# Patient Record
Sex: Female | Born: 1975 | Race: Black or African American | Hispanic: No | Marital: Married | Smoking: Never smoker
Health system: Southern US, Community
[De-identification: ages and names within clinical notes are randomized; demographics above are authoritative.]

## PROBLEM LIST (undated history)

## (undated) DIAGNOSIS — I10 Essential (primary) hypertension: Secondary | ICD-10-CM

## (undated) DIAGNOSIS — E119 Type 2 diabetes mellitus without complications: Secondary | ICD-10-CM

## (undated) DIAGNOSIS — K219 Gastro-esophageal reflux disease without esophagitis: Secondary | ICD-10-CM

## (undated) HISTORY — PX: OTHER SURGICAL HISTORY: SHX169

## (undated) HISTORY — PX: TUBAL LIGATION: SHX77

---

## 2009-02-26 ENCOUNTER — Ambulatory Visit: Payer: Self-pay | Admitting: Obstetrics

## 2011-04-09 ENCOUNTER — Ambulatory Visit: Payer: Self-pay | Admitting: Obstetrics & Gynecology

## 2015-01-23 ENCOUNTER — Emergency Department (INDEPENDENT_AMBULATORY_CARE_PROVIDER_SITE_OTHER)
Admission: EM | Admit: 2015-01-23 | Discharge: 2015-01-23 | Disposition: A | Discharge status: Home / Self Care | Source: Home / Self Care | Attending: Family Medicine | Admitting: Family Medicine

## 2015-01-23 ENCOUNTER — Encounter (HOSPITAL_COMMUNITY): Payer: Self-pay | Admitting: *Deleted

## 2015-01-23 DIAGNOSIS — J069 Acute upper respiratory infection, unspecified: Secondary | ICD-10-CM | POA: Diagnosis not present

## 2015-01-23 HISTORY — DX: Type 2 diabetes mellitus without complications: E11.9

## 2015-01-23 HISTORY — DX: Essential (primary) hypertension: I10

## 2015-01-23 MED ORDER — IPRATROPIUM BROMIDE 0.06 % NA SOLN: 2.0000 | Freq: Four times a day (QID) | NASAL | Status: DC

## 2015-01-23 MED ORDER — AZITHROMYCIN 250 MG PO TABS: ORAL_TABLET | ORAL | Status: DC

## 2015-01-23 MED ORDER — GUAIFENESIN-CODEINE 100-10 MG/5ML PO SYRP: 10.0000 mL | ORAL_SOLUTION | Freq: Four times a day (QID) | ORAL | Status: DC | PRN

## 2015-01-23 NOTE — Discharge Instructions (Signed)
Drink plenty of fluids as discussed, use medicine as prescribed, and mucinex or delsym for cough. Return or see your doctor if further problems °

## 2015-01-23 NOTE — ED Notes (Signed)
Pt    Reports        Symptoms   Of          Cough    Congested   Sinus   Drainage   With  Symptoms  Onset        Yesterday       Pt  Reports  Went to  Work  Today  And  Vomited

## 2015-01-23 NOTE — ED Provider Notes (Signed)
CSN: [id number]     Arrival date & time 01/23/15  1423 History   First MD Initiated Contact with Patient 01/23/15 1502     Chief Complaint  Patient presents with  . URI   (Consider location/radiation/quality/duration/timing/severity/associated sxs/prior Treatment) Patient is a 39 y.o. female presenting with URI. The history is provided by the patient.  URI Presenting symptoms: congestion, cough, fever and rhinorrhea   Severity:  Mild Onset quality:  Gradual Duration:  2 days Chronicity:  New Relieved by:  None tried Worsened by:  Nothing tried Ineffective treatments:  OTC medications Associated symptoms: no wheezing     Past Medical History  Diagnosis Date  . Hypertension   . Diabetes mellitus without complication    Past Surgical History  Procedure Laterality Date  . Btl     History reviewed. No pertinent family history. History  Substance Use Topics  . Smoking status: Never Smoker   . Smokeless tobacco: Not on file  . Alcohol Use: No   OB History    No data available     Review of Systems  Constitutional: Positive for fever.  HENT: Positive for congestion, postnasal drip and rhinorrhea.   Respiratory: Positive for cough. Negative for shortness of breath and wheezing.   Cardiovascular: Negative.     Allergies  Review of patient's allergies indicates no known allergies.  Home Medications   Prior to Admission medications   Medication Sig Start Date End Date Taking? Authorizing Provider  AMLODIPINE BESYLATE PO Take by mouth.   Yes Historical Provider, MD  Cetirizine HCl (ZYRTEC PO) Take by mouth.   Yes Historical Provider, MD  METFORMIN HCL PO Take by mouth.   Yes Historical Provider, MD  telmisartan (MICARDIS) 20 MG tablet Take 20 mg by mouth daily.   Yes Historical Provider, MD  azithromycin (ZITHROMAX Z-PAK) 250 MG tablet Take as directed on pack 01/23/15   Billy Fischer, MD  guaiFENesin-codeine [facility]) 100-10 MG/5ML syrup Take 10 mLs by mouth 4  (four) times daily as needed for cough. 01/23/15   Billy Fischer, MD  ipratropium (ATROVENT) 0.06 % nasal spray Place 2 sprays into both nostrils 4 (four) times daily. 01/23/15   Billy Fischer, MD   BP 131/87 mmHg  Pulse 100  Temp(Src) 100.7 F (38.2 C) (Oral)  Resp 16  SpO2 96%  LMP 12/24/2014 Physical Exam  Constitutional: She is oriented to person, place, and time. She appears well-developed and well-nourished. No distress.  HENT:  Right Ear: External ear normal.  Left Ear: External ear normal.  Mouth/Throat: Oropharynx is clear and moist.  Neck: Normal range of motion. Neck supple.  Cardiovascular: Normal rate, regular rhythm, normal heart sounds and intact distal pulses.   Pulmonary/Chest: Effort normal and breath sounds normal.  Neurological: She is alert and oriented to person, place, and time.  Skin: Skin is warm and dry.  Nursing note and vitals reviewed.   ED Course  Procedures (including critical care time) Labs Review Labs Reviewed - No data to display  Imaging Review No results found.   MDM   1. URI (upper respiratory infection)       Billy Fischer, MD 01/23/15 [phone]

## 2017-09-08 ENCOUNTER — Other Ambulatory Visit: Payer: Self-pay

## 2017-09-08 ENCOUNTER — Emergency Department (HOSPITAL_COMMUNITY)

## 2017-09-08 ENCOUNTER — Encounter (HOSPITAL_COMMUNITY): Payer: Self-pay | Admitting: Emergency Medicine

## 2017-09-08 ENCOUNTER — Inpatient Hospital Stay (HOSPITAL_COMMUNITY)
Admission: EM | Admit: 2017-09-08 | Discharge: 2017-09-12 | DRG: 287 | Disposition: A | Discharge status: Home / Self Care | Attending: Family Medicine | Admitting: Family Medicine

## 2017-09-08 DIAGNOSIS — I071 Rheumatic tricuspid insufficiency: Secondary | ICD-10-CM | POA: Diagnosis present

## 2017-09-08 DIAGNOSIS — Z8249 Family history of ischemic heart disease and other diseases of the circulatory system: Secondary | ICD-10-CM

## 2017-09-08 DIAGNOSIS — E876 Hypokalemia: Secondary | ICD-10-CM | POA: Diagnosis not present

## 2017-09-08 DIAGNOSIS — Z6838 Body mass index (BMI) 38.0-38.9, adult: Secondary | ICD-10-CM

## 2017-09-08 DIAGNOSIS — Z7984 Long term (current) use of oral hypoglycemic drugs: Secondary | ICD-10-CM | POA: Diagnosis not present

## 2017-09-08 DIAGNOSIS — Z7982 Long term (current) use of aspirin: Secondary | ICD-10-CM | POA: Diagnosis not present

## 2017-09-08 DIAGNOSIS — I10 Essential (primary) hypertension: Secondary | ICD-10-CM | POA: Diagnosis present

## 2017-09-08 DIAGNOSIS — E119 Type 2 diabetes mellitus without complications: Secondary | ICD-10-CM | POA: Diagnosis not present

## 2017-09-08 DIAGNOSIS — R0789 Other chest pain: Secondary | ICD-10-CM | POA: Diagnosis not present

## 2017-09-08 DIAGNOSIS — R079 Chest pain, unspecified: Secondary | ICD-10-CM | POA: Diagnosis present

## 2017-09-08 DIAGNOSIS — R0609 Other forms of dyspnea: Secondary | ICD-10-CM | POA: Diagnosis not present

## 2017-09-08 DIAGNOSIS — R112 Nausea with vomiting, unspecified: Secondary | ICD-10-CM | POA: Diagnosis present

## 2017-09-08 DIAGNOSIS — R202 Paresthesia of skin: Secondary | ICD-10-CM | POA: Diagnosis present

## 2017-09-08 DIAGNOSIS — E1169 Type 2 diabetes mellitus with other specified complication: Secondary | ICD-10-CM

## 2017-09-08 LAB — BASIC METABOLIC PANEL
Anion gap: 8 (ref 5–15)
BUN: 5 mg/dL — ABNORMAL LOW (ref 6–20)
CO2: 22 mmol/L (ref 22–32)
CREATININE: 0.8 mg/dL (ref 0.44–1.00)
Calcium: 8.9 mg/dL (ref 8.9–10.3)
Chloride: 105 mmol/L (ref 101–111)
GFR calc non Af Amer: >60 mL/min (ref >60)
Glucose, Bld: 226 mg/dL — ABNORMAL HIGH (ref 65–99)
Potassium: 3.2 mmol/L — ABNORMAL LOW (ref 3.5–5.1)
SODIUM: 135 mmol/L (ref 135–145)

## 2017-09-08 LAB — TROPONIN I: Troponin I: <0.03 ng/mL (ref <0.03)

## 2017-09-08 LAB — I-STAT TROPONIN, ED: TROPONIN I, POC: 0 ng/mL (ref 0.00–0.08)

## 2017-09-08 LAB — CBC
HCT: 38.5 % (ref 36.0–46.0)
Hemoglobin: 12.4 g/dL (ref 12.0–15.0)
MCH: 29.5 pg (ref 26.0–34.0)
MCHC: 32.2 g/dL (ref 30.0–36.0)
MCV: 91.7 fL (ref 78.0–100.0)
Platelets: 448 10*3/uL — ABNORMAL HIGH (ref 150–400)
RBC: 4.2 MIL/uL (ref 3.87–5.11)
RDW: 13.4 % (ref 11.5–15.5)
WBC: 7.4 10*3/uL (ref 4.0–10.5)

## 2017-09-08 LAB — BRAIN NATRIURETIC PEPTIDE: B Natriuretic Peptide: 13.4 pg/mL (ref 0.0–100.0)

## 2017-09-08 LAB — I-STAT BETA HCG BLOOD, ED (MC, WL, AP ONLY): I-stat hCG, quantitative: <5 m[IU]/mL (ref <5)

## 2017-09-08 MED ORDER — ENOXAPARIN SODIUM 40 MG/0.4ML ~~LOC~~ SOLN
40.0000 mg | Freq: Every day | SUBCUTANEOUS | Status: DC
  Administered 2017-09-10 – 2017-09-12 (×3): 40 mg via SUBCUTANEOUS
  Filled 2017-09-08 (×5): qty 0.4

## 2017-09-08 MED ORDER — ASPIRIN EC 81 MG PO TBEC
81.0000 mg | DELAYED_RELEASE_TABLET | Freq: Every day | ORAL | Status: DC
  Administered 2017-09-09 – 2017-09-12 (×4): 81 mg via ORAL
  Filled 2017-09-08 (×4): qty 1

## 2017-09-08 MED ORDER — METOPROLOL TARTRATE 25 MG PO TABS
25.0000 mg | ORAL_TABLET | Freq: Two times a day (BID) | ORAL | Status: DC
  Administered 2017-09-08 – 2017-09-12 (×7): 25 mg via ORAL
  Filled 2017-09-08 (×7): qty 1

## 2017-09-08 MED ORDER — INSULIN ASPART 100 UNIT/ML ~~LOC~~ SOLN
0.0000 [IU] | Freq: Three times a day (TID) | SUBCUTANEOUS | Status: DC
  Administered 2017-09-09: 2 [IU] via SUBCUTANEOUS
  Administered 2017-09-10 – 2017-09-12 (×4): 1 [IU] via SUBCUTANEOUS
  Filled 2017-09-08: qty 1

## 2017-09-08 MED ORDER — ONDANSETRON HCL 4 MG/2ML IJ SOLN: 4.0000 mg | Freq: Four times a day (QID) | INTRAMUSCULAR | Status: DC | PRN

## 2017-09-08 MED ORDER — ASPIRIN 81 MG PO CHEW
324.0000 mg | CHEWABLE_TABLET | Freq: Once | ORAL | Status: AC
  Administered 2017-09-08: 324 mg via ORAL
  Filled 2017-09-08: qty 4

## 2017-09-08 MED ORDER — NITROGLYCERIN 0.4 MG SL SUBL
0.4000 mg | SUBLINGUAL_TABLET | SUBLINGUAL | Status: DC | PRN
  Administered 2017-09-08: 0.4 mg via SUBLINGUAL
  Filled 2017-09-08: qty 1

## 2017-09-08 MED ORDER — ATORVASTATIN CALCIUM 40 MG PO TABS
40.0000 mg | ORAL_TABLET | Freq: Once | ORAL | Status: AC
  Administered 2017-09-08: 40 mg via ORAL
  Filled 2017-09-08: qty 1

## 2017-09-08 MED ORDER — FAMOTIDINE IN NACL 20-0.9 MG/50ML-% IV SOLN
20.0000 mg | Freq: Two times a day (BID) | INTRAVENOUS | Status: DC
  Administered 2017-09-08 – 2017-09-09 (×2): 20 mg via INTRAVENOUS
  Filled 2017-09-08 (×2): qty 50

## 2017-09-08 MED ORDER — NITROGLYCERIN 0.4 MG SL SUBL: 0.4000 mg | SUBLINGUAL_TABLET | SUBLINGUAL | Status: DC | PRN

## 2017-09-08 MED ORDER — TELMISARTAN-HCTZ 40-12.5 MG PO TABS: 1.0000 | ORAL_TABLET | Freq: Every day | ORAL | Status: DC

## 2017-09-08 MED ORDER — POTASSIUM CHLORIDE CRYS ER 20 MEQ PO TBCR
40.0000 meq | EXTENDED_RELEASE_TABLET | Freq: Once | ORAL | Status: AC
  Administered 2017-09-08: 40 meq via ORAL
  Filled 2017-09-08: qty 2

## 2017-09-08 MED ORDER — HYDROCHLOROTHIAZIDE 12.5 MG PO CAPS
12.5000 mg | ORAL_CAPSULE | Freq: Every day | ORAL | Status: DC
  Administered 2017-09-09 – 2017-09-12 (×4): 12.5 mg via ORAL
  Filled 2017-09-08 (×4): qty 1

## 2017-09-08 MED ORDER — ASPIRIN EC 81 MG PO TBEC: 81.0000 mg | DELAYED_RELEASE_TABLET | Freq: Every day | ORAL | Status: DC

## 2017-09-08 MED ORDER — NITROGLYCERIN 2 % TD OINT
1.0000 [in_us] | TOPICAL_OINTMENT | Freq: Once | TRANSDERMAL | Status: AC
  Administered 2017-09-08: 1 [in_us] via TOPICAL
  Filled 2017-09-08: qty 1

## 2017-09-08 MED ORDER — GI COCKTAIL ~~LOC~~
30.0000 mL | Freq: Three times a day (TID) | ORAL | Status: DC | PRN
  Administered 2017-09-08: 30 mL via ORAL
  Filled 2017-09-08: qty 30

## 2017-09-08 MED ORDER — IRBESARTAN 150 MG PO TABS
150.0000 mg | ORAL_TABLET | Freq: Every day | ORAL | Status: DC
  Administered 2017-09-09 – 2017-09-12 (×4): 150 mg via ORAL
  Filled 2017-09-08 (×6): qty 1

## 2017-09-08 MED ORDER — INSULIN ASPART 100 UNIT/ML ~~LOC~~ SOLN: 0.0000 [IU] | Freq: Every day | SUBCUTANEOUS | Status: DC

## 2017-09-08 MED ORDER — ACETAMINOPHEN 325 MG PO TABS
650.0000 mg | ORAL_TABLET | ORAL | Status: DC | PRN
  Administered 2017-09-09 – 2017-09-12 (×2): 650 mg via ORAL
  Filled 2017-09-08 (×2): qty 2

## 2017-09-08 NOTE — ED Provider Notes (Signed)
[facility] Provider Note   CSN: [id number] Arrival date & time: 09/08/17  1436     History   Chief Complaint Chief Complaint  Patient presents with  . Chest Pain    HPI [facility] Perkins is a 41 y.o. female.  HPI    41 year old female with history of [facility], [facility], [facility], [facility] Perkins, [facility] Perkins this morning, she had a dull, aching, substernal chest pressure.  She had associated nausea and one episode of emesis.  She thought it was due to reflux, but it feels different from her previous reflux.  Throughout the day, she has had intermittent episodes of dull chest pressure with occasional tingling in her right arm.  She has had associated shortness of breath during these episodes.  Denies any specific alleviating or aggravating factors.  No history of coronary [facility].  She is noticed that her blood pressure has been mildly elevated.  No other alleviating factors.  Past Medical History:  Diagnosis Date  . [facility] Perkins mellitus without complication ([place])   . [facility] Perkins     Patient Active Problem List   Diagnosis Date Noted  . [facility] 09/08/2017  . [facility] mellitus without complication ([place]) 89/38/1017  . Chest pain 09/08/2017  . Hypokalemia 09/08/2017    Past Surgical History:  Procedure Laterality Date  . btl      OB History    No data available       Home Medications    Prior to Admission medications   Medication Sig Start Date End Date Taking? Authorizing Provider  amLODipine (NORVASC) 10 MG tablet Take 10 mg by mouth daily.    Yes [provider]  aspirin EC 81 MG tablet Take 81 mg by mouth daily.   Yes [provider]  Cetirizine HCl (ZYRTEC PO) Take 10 mg by mouth daily.    Yes [provider]  METFORMIN HCL PO Take 2 tablets by mouth at bedtime.    Yes [provider]  metoprolol succinate  (TOPROL XL) 50 MG 24 hr tablet Take 50 mg by mouth daily.   Yes [provider]  telmisartan-hydrochlorothiazide (MICARDIS HCT) 40-12.5 MG tablet Take 1 tablet by mouth daily.   Yes [provider]    [facility] History [facility] History  Problem Relation Age of Onset  . Coronary artery [facility] Perkins Mother     Social History Social History   Tobacco Use  . Smoking status: Never Smoker  Substance Use Topics  . Alcohol use: No  . Drug use: Not on file     Allergies   Patient has no known allergies.   Review of Systems Review of Systems  Constitutional: Positive for fatigue.  Respiratory: Positive for cough, chest tightness and shortness of breath.   Cardiovascular: Positive for chest pain and leg swelling.  All other systems reviewed and are negative.    Physical Exam Updated Vital Signs BP 139/86   Pulse 65   Temp 98.8 F (37.1 C) (Oral)   Resp 17   Ht 5\' 4"  (1.626 m)   Wt 113.4 kg (250 lb)   LMP 08/29/2017   SpO2 98%   BMI 42.91 kg/m   Physical Exam  Constitutional: She is oriented to person, place, and time. She appears well-developed and well-nourished. No distress.  HENT:  Head: Normocephalic and atraumatic.  Eyes: Conjunctivae are normal.  Neck: Neck supple.  Cardiovascular: Normal rate, regular rhythm  and normal heart sounds. Exam reveals no friction rub.  No murmur heard. Pulmonary/Chest: Effort normal and breath sounds normal. No respiratory distress. She has no wheezes. She has no rales.  Abdominal: She exhibits no distension.  Musculoskeletal:       Right lower leg: She exhibits edema (1+ pitting).       Left lower leg: She exhibits edema (1+ pitting).  Neurological: She is alert and oriented to person, place, and time. She exhibits normal muscle tone.  Skin: Skin is warm. Capillary refill takes less than 2 seconds.  Psychiatric: She has a normal mood and affect.  Nursing note and vitals reviewed.    ED Treatments / Results   Labs (all labs ordered are listed, but only abnormal results are displayed) Labs Reviewed  BASIC METABOLIC PANEL - Abnormal; Notable for the following components:      Result Value   Potassium 3.2 (*)    Glucose, Bld 226 (*)    BUN 5 (*)    All other components within normal limits  CBC - Abnormal; Notable for the following components:   Platelets 448 (*)    All other components within normal limits  BRAIN NATRIURETIC PEPTIDE  TROPONIN I  TROPONIN I  TROPONIN I  HIV ANTIBODY (ROUTINE TESTING)  BASIC METABOLIC PANEL  MAGNESIUM  LIPID PANEL  I-STAT TROPONIN, ED  I-STAT BETA HCG BLOOD, ED ([facility], [facility], [facility] ONLY)    EKG  EKG Interpretation  Date/Time:  Thursday September 08 2017 14:39:45 EST Ventricular Rate:  91 PR Interval:  184 QRS Duration: 82 QT Interval:  380 QTC Calculation: 467 R Axis:   59 Text Interpretation:  Normal sinus rhythm Anteroseptal infarct , age undetermined Abnormal ECG No old tracing to compare TWI in inferolateral leads Q waves in anteroseptal leads Reconfirmed by Duffy Bruce [phone]) on 09/08/2017 9:48:41 PM       Radiology Dg Chest 2 View  Result Date: 09/08/2017 CLINICAL DATA:  Chest tightness and right-sided tingling today. History of heart murmur, [facility], pre [facility]. EXAM: CHEST  2 VIEW COMPARISON:  None in PACs FINDINGS: The lungs are adequately inflated and clear. The heart and pulmonary vascularity are normal. The mediastinum is normal in width. There is no pleural effusion. The bony thorax exhibits no acute abnormality. IMPRESSION: There is no active cardiopulmonary [facility]. Electronically Signed   By: David  Martinique M.D.   On: 09/08/2017 15:25    Procedures Procedures (including critical care time)  Medications Ordered in ED Medications  famotidine (PEPCID) IVPB 20 mg premix (not administered)  gi cocktail (Maalox,Lidocaine,Donnatal) (not administered)  metoprolol tartrate (LOPRESSOR) tablet 25 mg (not administered)   telmisartan-hydrochlorothiazide (MICARDIS HCT) 40-12.5 MG per tablet 1 tablet (not administered)  aspirin EC tablet 81 mg (not administered)  nitroGLYCERIN (NITROSTAT) SL tablet 0.4 mg (not administered)  acetaminophen (TYLENOL) tablet 650 mg (not administered)  ondansetron (ZOFRAN) injection 4 mg (not administered)  enoxaparin (LOVENOX) injection 40 mg (not administered)  atorvastatin (LIPITOR) tablet 40 mg (not administered)  aspirin chewable tablet 324 mg (324 mg Oral Given 09/08/17 1939)  nitroGLYCERIN (NITROGLYN) 2 % ointment 1 inch (1 inch Topical Given 09/08/17 2130)  potassium chloride SA (K-DUR,KLOR-CON) CR tablet 40 mEq (40 mEq Oral Given 09/08/17 2132)     Initial Impression / Assessment and Plan / ED Course  I have reviewed the triage vital signs and the nursing notes.  Pertinent labs & imaging results that were available during my care of the patient were reviewed by me and  considered in my medical decision making (see chart for details).     41 year old female with past medical history as above [facility] with fairly concerning chest pain history.  Troponin negative but pain has been coming and going throughout the day.  Heart score is 4.  EKG nonischemic.  However, she does have old anterior Q waves as well as T wave inversions.  She is chest pain-free after nitroglycerin.  Will admit for high risk chest pain evaluation.  Final Clinical Impressions(s) / ED Diagnoses   Final diagnoses:  Atypical chest pain    ED Discharge Orders    None       Duffy Bruce, MD 09/08/17 2208

## 2017-09-08 NOTE — ED Triage Notes (Signed)
Pt states she at work around 130pm when she had a sudden onset of chest pain and tightness with right sided 'tingling". Pt states she still has some tightness in the center of her chest.

## 2017-09-08 NOTE — H&P (Signed)
History and Physical    [facility] XTG:[id number] DOB: 03/27/1976 DOA: 09/08/2017  PCP: Glendale Chard, MD   Patient coming from: Home  Chief Complaint: Chest discomfort, N/V  HPI: [facility] Perkins is a 41 y.o. female with medical history significant for hypertension and type 2 diabetes mellitus, now presenting to the emergency department for evaluation of chest discomfort.  She reports that she woke this morning with a "tightness" in her central chest symptoms were mild she suspected that it was secondary to indigestion.  The discomfort eventually eased off, but recurred several times throughout the day while she was at work.  There did not seem to be any worsening with exertion and no alleviating or exacerbating factors were identified.  She was perhaps mildly short of breath during the episodes and there was nausea with episode of vomiting associated with this.  She had never experienced these symptoms previously.  Went to bed last night in her usual state and was, or chills.  She reports that her maternal grandmother coronary artery disease with a first MI in her early 73s.  ED Course: Upon arrival to the ED, patient is found to be afebrile, saturating well on room air, and.  EKG features a normal sinus rhythm with nonspecific T wave abnormality in diffuse leads.  Chest x-ray is negative for acute cardiopulmonary disease and troponin is undetectable.  Mr. panel is notable for a potassium of 3.2 and CBC features a slight thrombocytosis with platelets 148,000.  Patient was treated with 324 mg of aspirin, sublingual nitroglycerin, and 40 mEq oral potassium.  She reported resolution of her pain after the sublingual nitroglycerin, pain later recurred in the emergency department and an inch of nitroglycerin ointment was applied.  Patient remains hemodynamically stable and in no apparent respiratory distress.  She will be observed on the telemetry unit for ongoing evaluation and management of atypical  chest pain with risk factors for CAD and EKG abnormalities with no prior available for comparison.  Review of Systems:  All other systems reviewed and apart from HPI, are negative.  Past Medical History:  Diagnosis Date  . Diabetes mellitus without complication ([place])   . Hypertension     Past Surgical History:  Procedure Laterality Date  . btl       reports that  has never smoked. She does not have any smokeless tobacco history on file. She reports that she does not drink alcohol. Her drug history is not on file.  No Known Allergies  Family History  Problem Relation Age of Onset  . Coronary artery disease Mother      Prior to Admission medications   Medication Sig Start Date End Date Taking? Authorizing Provider  amLODipine (NORVASC) 10 MG tablet Take 10 mg by mouth daily.    Yes [provider]  aspirin EC 81 MG tablet Take 81 mg by mouth daily.   Yes [provider]  Cetirizine HCl (ZYRTEC PO) Take 10 mg by mouth daily.    Yes [provider]  METFORMIN HCL PO Take 2 tablets by mouth at bedtime.    Yes [provider]  metoprolol succinate (TOPROL XL) 50 MG 24 hr tablet Take 50 mg by mouth daily.   Yes [provider]  telmisartan-hydrochlorothiazide (MICARDIS HCT) 40-12.5 MG tablet Take 1 tablet by mouth daily.   Yes [provider]    Physical Exam: Vitals:   09/08/17 1832 09/08/17 2030 09/08/17 2100 09/08/17 2130  BP: (!) 146/106 128/80 120/74  139/86  Pulse: 79 70 62 65  Resp: 16 14 14 17   Temp:      TempSrc:      SpO2: 98% 100% 98% 98%  Weight:      Height:          Constitutional: NAD, calm, comfortable Eyes: PERTLA, lids and conjunctivae normal ENMT: Mucous membranes are moist. Posterior pharynx clear of any exudate or lesions.   Neck: normal, supple, no masses, no thyromegaly Respiratory: clear to auscultation bilaterally, no wheezing, no crackles. Normal respiratory effort.   Cardiovascular: S1 &  S2 heard, regular rate and rhythm. No significant JVD. Abdomen: No distension, no tenderness, no masses palpated. Bowel sounds normal.  Musculoskeletal: no clubbing / cyanosis. No joint deformity upper and lower extremities.  Skin: no significant rashes, lesions, ulcers. Warm, dry, well-perfused. Neurologic: CN 2-12 grossly intact. Sensation intact. Strength 5/5 in all 4 limbs.  Psychiatric: Alert and oriented x 3. Pleasant and cooperative.     Labs on Admission: I have personally reviewed following labs and imaging studies  CBC: Recent Labs  Lab 09/08/17 1502  WBC 7.4  HGB 12.4  HCT 38.5  MCV 91.7  PLT 161*   Basic Metabolic Panel: Recent Labs  Lab 09/08/17 1502  NA 135  K 3.2*  CL 105  CO2 22  GLUCOSE 226*  BUN 5*  CREATININE 0.80  CALCIUM 8.9   GFR: Estimated Creatinine Clearance: 115.4 mL/min (by C-G formula based on SCr of 0.8 mg/dL). Liver Function Tests: No results for input(s): AST, ALT, ALKPHOS, BILITOT, PROT, ALBUMIN in the last 168 hours. No results for input(s): LIPASE, AMYLASE in the last 168 hours. No results for input(s): AMMONIA in the last 168 hours. Coagulation Profile: No results for input(s): INR, PROTIME in the last 168 hours. Cardiac Enzymes: No results for input(s): CKTOTAL, CKMB, CKMBINDEX, TROPONINI in the last 168 hours. BNP (last 3 results) No results for input(s): PROBNP in the last 8760 hours. HbA1C: No results for input(s): HGBA1C in the last 72 hours. CBG: No results for input(s): GLUCAP in the last 168 hours. Lipid Profile: No results for input(s): CHOL, HDL, LDLCALC, TRIG, CHOLHDL, LDLDIRECT in the last 72 hours. Thyroid Function Tests: No results for input(s): TSH, T4TOTAL, FREET4, T3FREE, THYROIDAB in the last 72 hours. Anemia Panel: No results for input(s): VITAMINB12, FOLATE, FERRITIN, TIBC, IRON, RETICCTPCT in the last 72 hours. Urine analysis: No results found for: COLORURINE, APPEARANCEUR, LABSPEC, PHURINE, GLUCOSEU,  HGBUR, BILIRUBINUR, KETONESUR, PROTEINUR, UROBILINOGEN, NITRITE, LEUKOCYTESUR Sepsis Labs: @LABRCNTIP (procalcitonin:4,lacticidven:4) )No results found for this or any previous visit (from the past 240 hour(s)).   Radiological Exams on Admission: Dg Chest 2 View  Result Date: 09/08/2017 CLINICAL DATA:  Chest tightness and right-sided tingling today. History of heart murmur, hypertension, pre diabetes. EXAM: CHEST  2 VIEW COMPARISON:  None in PACs FINDINGS: The lungs are adequately inflated and clear. The heart and pulmonary vascularity are normal. The mediastinum is normal in width. There is no pleural effusion. The bony thorax exhibits no acute abnormality. IMPRESSION: There is no active cardiopulmonary disease. Electronically Signed   By: David  Martinique M.D.   On: 09/08/2017 15:25    EKG: Independently reviewed. Normal sinus rhythm, non-specific T-wave abnormality.   Assessment/Plan  1. Chest pain  - Pt presents with chest pain, atypical description, though did resolve with NTG in ED  - She has risk factors including HTN, DM, and premature CAD in relative; there are non-specific EKG changes with no prior available to compare  -  CXR unremarkable and initial troponin undetectable  - Treated with ASA 324 and NTG in ED, given a dose of Lopressor and Lipitor on admission  - Continue cardiac monitoring, obtain serial troponin measurements, repeat EKG, continue ASA and beta-blocker, check fasting lipids and consider continuing statin   2. Hypertension  - DBP elevated in ED  - Managed with Toprol, telmisartan-HCTZ at home  - Continue ARB with HCTZ; Toprol converted to Lopressor with dose now    3. Type II DM  - No A1c on file  - Managed at home with metformin only, held on admission  - Check CBG's and use low-intensity SSI as needed    4. Hypokalemia  - Serum potassium 3.2 on admission  - Treated in ED with 40 mEq oral potassium   - Repeat chemistry panel in am     DVT prophylaxis:  Lovenox Code Status: Full  Family Communication: Discussed with patient Disposition Plan: Observe on telemetry Consults called: None Admission status: Observation    Vianne Bulls, MD [facility] Pager [phone]  If 7PM-7AM, please contact night-coverage [email or web address] Password [facility]  09/08/2017, 9:56 PM

## 2017-09-09 ENCOUNTER — Other Ambulatory Visit: Payer: Self-pay

## 2017-09-09 ENCOUNTER — Observation Stay (HOSPITAL_BASED_OUTPATIENT_CLINIC_OR_DEPARTMENT_OTHER)

## 2017-09-09 DIAGNOSIS — I340 Nonrheumatic mitral (valve) insufficiency: Secondary | ICD-10-CM | POA: Diagnosis not present

## 2017-09-09 DIAGNOSIS — R079 Chest pain, unspecified: Secondary | ICD-10-CM | POA: Diagnosis not present

## 2017-09-09 LAB — HEPATIC FUNCTION PANEL
ALK PHOS: 106 U/L (ref 38–126)
ALT: 48 U/L (ref 14–54)
AST: 43 U/L — AB (ref 15–41)
Albumin: 3.2 g/dL — ABNORMAL LOW (ref 3.5–5.0)
BILIRUBIN DIRECT: 0.1 mg/dL (ref 0.1–0.5)
BILIRUBIN INDIRECT: 0.6 mg/dL (ref 0.3–0.9)
BILIRUBIN TOTAL: 0.7 mg/dL (ref 0.3–1.2)
Total Protein: 6.3 g/dL — ABNORMAL LOW (ref 6.5–8.1)

## 2017-09-09 LAB — ECHOCARDIOGRAM COMPLETE
AOASC: 28 cm
CHL CUP MV DEC (S): 169
E/e' ratio: 9.81
EWDT: 169 ms
FS: 31 % (ref 28–44)
Height: 64 in
IVS/LV PW RATIO, ED: 0.9
LA diam end sys: 42 mm
LADIAMINDEX: 1.95 cm/m2
LASIZE: 42 mm
LAVOL: 58 mL
LAVOLA4C: 53 mL
LAVOLIN: 27 mL/m2
LDCA: 3.46 cm2
LV E/e' medial: 9.81
LV E/e'average: 9.81
LV PW d: 10 mm — AB (ref 0.6–1.1)
LV TDI E'LATERAL: 10.4
LV TDI E'MEDIAL: 8.05
LVELAT: 10.4 cm/s
LVOT SV: 64 mL
LVOT VTI: 18.5 cm
LVOTD: 21 mm
LVOTPV: 89.4 cm/s
Lateral S' vel: 12.1 cm/s
MV pk E vel: 102 m/s
MVPG: 4 mmHg
MVPKAVEL: 85.4 m/s
RV TAPSE: 23 mm
Weight: 4000 oz

## 2017-09-09 LAB — CBG MONITORING, ED
GLUCOSE-CAPILLARY: 108 mg/dL — AB (ref 65–99)
Glucose-Capillary: 133 mg/dL — ABNORMAL HIGH (ref 65–99)
Glucose-Capillary: 91 mg/dL (ref 65–99)

## 2017-09-09 LAB — LIPID PANEL
CHOLESTEROL: 99 mg/dL (ref 0–200)
HDL: 31 mg/dL — AB (ref >40)
LDL Cholesterol: 47 mg/dL (ref 0–99)
TRIGLYCERIDES: 105 mg/dL (ref <150)
Total CHOL/HDL Ratio: 3.2 RATIO
VLDL: 21 mg/dL (ref 0–40)

## 2017-09-09 LAB — GLUCOSE, CAPILLARY
GLUCOSE-CAPILLARY: 168 mg/dL — AB (ref 65–99)
GLUCOSE-CAPILLARY: 136 mg/dL — AB (ref 65–99)

## 2017-09-09 LAB — BASIC METABOLIC PANEL
ANION GAP: 7 (ref 5–15)
BUN: 8 mg/dL (ref 6–20)
CALCIUM: 8.5 mg/dL — AB (ref 8.9–10.3)
CO2: 21 mmol/L — ABNORMAL LOW (ref 22–32)
Chloride: 107 mmol/L (ref 101–111)
Creatinine, Ser: 0.73 mg/dL (ref 0.44–1.00)
GLUCOSE: 212 mg/dL — AB (ref 65–99)
POTASSIUM: 3.9 mmol/L (ref 3.5–5.1)
SODIUM: 135 mmol/L (ref 135–145)

## 2017-09-09 LAB — LIPASE, BLOOD: LIPASE: 21 U/L (ref 11–51)

## 2017-09-09 LAB — TROPONIN I
Troponin I: <0.03 ng/mL (ref <0.03)
Troponin I: <0.03 ng/mL (ref <0.03)

## 2017-09-09 LAB — HIV ANTIBODY (ROUTINE TESTING W REFLEX): HIV Screen 4th Generation wRfx: NONREACTIVE

## 2017-09-09 LAB — MAGNESIUM: MAGNESIUM: 1.9 mg/dL (ref 1.7–2.4)

## 2017-09-09 MED ORDER — FAMOTIDINE 20 MG PO TABS
20.0000 mg | ORAL_TABLET | Freq: Two times a day (BID) | ORAL | Status: DC
  Administered 2017-09-09 – 2017-09-12 (×6): 20 mg via ORAL
  Filled 2017-09-09 (×6): qty 1

## 2017-09-09 NOTE — ED Notes (Signed)
Patient CBG was 91%.

## 2017-09-09 NOTE — Progress Notes (Signed)
  Echocardiogram 2D Echocardiogram has been performed.  [facility] 09/09/2017, 2:59 PM

## 2017-09-09 NOTE — ED Notes (Signed)
Breakfast tray ordered at 0612

## 2017-09-09 NOTE — ED Notes (Signed)
Family at bedside. 

## 2017-09-09 NOTE — Consult Note (Signed)
Referring Physician:  Gabrielle Perkins is an 41 y.o. female.                       Chief Complaint: Chest pain  HPI: 41 year old female has recurrent chest pain, central as well as left precordial along with exertional shortness of breath. She denies fever or cough. She has family history of MI to grand mother at age 40. She has PMH of hypertension, obesity and type II DM.  Past Medical History:  Diagnosis Date  . Diabetes mellitus without complication (HCC)   . Hypertension       Past Surgical History:  Procedure Laterality Date  . btl      Family History  Problem Relation Age of Onset  . Coronary artery disease Maternal Grandmother    Social History:  reports that  has never smoked. She does not have any smokeless tobacco history on file. She reports that she does not drink alcohol. Her drug history is not on file.  Allergies: No Known Allergies  Medications Prior to Admission  Medication Sig Dispense Refill  . amLODipine (NORVASC) 10 MG tablet Take 10 mg by mouth daily.     . aspirin EC 81 MG tablet Take 81 mg by mouth daily.    . Cetirizine HCl (ZYRTEC PO) Take 10 mg by mouth daily.     . METFORMIN HCL PO Take 2 tablets by mouth at bedtime.     . metoprolol succinate (TOPROL XL) 50 MG 24 hr tablet Take 50 mg by mouth daily.    . telmisartan-hydrochlorothiazide (MICARDIS HCT) 40-12.5 MG tablet Take 1 tablet by mouth daily.      Results for orders placed or performed during the hospital encounter of 09/08/17 (from the past 48 hour(s))  Basic metabolic panel     Status: Abnormal   Collection Time: 09/08/17  3:02 PM  Result Value Ref Range   Sodium 135 135 - 145 mmol/L   Potassium 3.2 (L) 3.5 - 5.1 mmol/L   Chloride 105 101 - 111 mmol/L   CO2 22 22 - 32 mmol/L   Glucose, Bld 226 (H) 65 - 99 mg/dL   BUN 5 (L) 6 - 20 mg/dL   Creatinine, Ser 0.80 0.44 - 1.00 mg/dL   Calcium 8.9 8.9 - 10.3 mg/dL   GFR calc non Af Amer >60 >60 mL/min   GFR calc Af Amer >60 >60 mL/min   Comment: (NOTE) The eGFR has been calculated using the CKD EPI equation. This calculation has not been validated in all clinical situations. eGFR's persistently <60 mL/min signify possible Chronic Kidney Disease.    Anion gap 8 5 - 15  CBC     Status: Abnormal   Collection Time: 09/08/17  3:02 PM  Result Value Ref Range   WBC 7.4 4.0 - 10.5 K/uL   RBC 4.20 3.87 - 5.11 MIL/uL   Hemoglobin 12.4 12.0 - 15.0 g/dL   HCT 38.5 36.0 - 46.0 %   MCV 91.7 78.0 - 100.0 fL   MCH 29.5 26.0 - 34.0 pg   MCHC 32.2 30.0 - 36.0 g/dL   RDW 13.4 11.5 - 15.5 %   Platelets 448 (H) 150 - 400 K/uL  Brain natriuretic peptide     Status: None   Collection Time: 09/08/17  3:02 PM  Result Value Ref Range   B Natriuretic Peptide 13.4 0.0 - 100.0 pg/mL  I-stat troponin, ED     Status: None     Collection Time: 09/08/17  3:06 PM  Result Value Ref Range   Troponin i, poc 0.00 0.00 - 0.08 ng/mL   Comment 3            Comment: Due to the release kinetics of cTnI, a negative result within the first hours of the onset of symptoms does not rule out myocardial infarction with certainty. If myocardial infarction is still suspected, repeat the test at appropriate intervals.   I-Stat beta hCG blood, ED     Status: None   Collection Time: 09/08/17  3:06 PM  Result Value Ref Range   I-stat hCG, quantitative <5.0 <5 mIU/mL   Comment 3            Comment:   GEST. AGE      CONC.  (mIU/mL)   <=1 WEEK        5 - 50     2 WEEKS       50 - 500     3 WEEKS       100 - 10,000     4 WEEKS     1,000 - 30,000        FEMALE AND NON-PREGNANT FEMALE:     LESS THAN 5 mIU/mL   Troponin I     Status: None   Collection Time: 09/08/17 10:37 PM  Result Value Ref Range   Troponin I <0.03 <0.03 ng/mL  CBG monitoring, ED     Status: Abnormal   Collection Time: 09/08/17 11:23 PM  Result Value Ref Range   Glucose-Capillary 133 (H) 65 - 99 mg/dL  Troponin I     Status: None   Collection Time: 09/09/17  5:39 AM  Result Value Ref  Range   Troponin I <0.03 <0.03 ng/mL  HIV antibody (Routine Testing)     Status: None   Collection Time: 09/09/17  5:39 AM  Result Value Ref Range   HIV Screen 4th Generation wRfx Non Reactive Non Reactive    Comment: (NOTE) Performed At: [facility] [address] [place], [place] [id number] Nagendra Sanjai MD Ph:[phone]   Basic metabolic panel     Status: Abnormal   Collection Time: 09/09/17  5:39 AM  Result Value Ref Range   Sodium 135 135 - 145 mmol/L   Potassium 3.9 3.5 - 5.1 mmol/L   Chloride 107 101 - 111 mmol/L   CO2 21 (L) 22 - 32 mmol/L   Glucose, Bld 212 (H) 65 - 99 mg/dL   BUN 8 6 - 20 mg/dL   Creatinine, Ser 0.73 0.44 - 1.00 mg/dL   Calcium 8.5 (L) 8.9 - 10.3 mg/dL   GFR calc non Af Amer >60 >60 mL/min   GFR calc Af Amer >60 >60 mL/min    Comment: (NOTE) The eGFR has been calculated using the CKD EPI equation. This calculation has not been validated in all clinical situations. eGFR's persistently <60 mL/min signify possible Chronic Kidney Disease.    Anion gap 7 5 - 15  Magnesium     Status: None   Collection Time: 09/09/17  5:39 AM  Result Value Ref Range   Magnesium 1.9 1.7 - 2.4 mg/dL  Lipid panel     Status: Abnormal   Collection Time: 09/09/17  5:39 AM  Result Value Ref Range   Cholesterol 99 0 - 200 mg/dL   Triglycerides 105 <150 mg/dL   HDL 31 (L) >40 mg/dL   Total CHOL/HDL Ratio 3.2 RATIO   VLDL 21 0 - 40   mg/dL   LDL Cholesterol 47 0 - 99 mg/dL    Comment:        Total Cholesterol/HDL:CHD Risk Coronary Heart Disease Risk Table                     Men   Women  1/2 Average Risk   3.4   3.3  Average Risk       5.0   4.4  2 X Average Risk   9.6   7.1  3 X Average Risk  23.4   11.0        Use the calculated Patient Ratio above and the CHD Risk Table to determine the patient's CHD Risk.        ATP III CLASSIFICATION (LDL):  <100     mg/dL   Optimal  100-129  mg/dL   Near or Above                    Optimal  130-159  mg/dL    Borderline  160-189  mg/dL   High  >190     mg/dL   Very High   CBG monitoring, ED     Status: Abnormal   Collection Time: 09/09/17  8:28 AM  Result Value Ref Range   Glucose-Capillary 108 (H) 65 - 99 mg/dL  Troponin I     Status: None   Collection Time: 09/09/17  9:46 AM  Result Value Ref Range   Troponin I <0.03 <0.03 ng/mL  Lipase, blood     Status: None   Collection Time: 09/09/17  9:46 AM  Result Value Ref Range   Lipase 21 11 - 51 U/L  Hepatic function panel     Status: Abnormal   Collection Time: 09/09/17  9:46 AM  Result Value Ref Range   Total Protein 6.3 (L) 6.5 - 8.1 g/dL   Albumin 3.2 (L) 3.5 - 5.0 g/dL   AST 43 (H) 15 - 41 U/L   ALT 48 14 - 54 U/L   Alkaline Phosphatase 106 38 - 126 U/L   Total Bilirubin 0.7 0.3 - 1.2 mg/dL   Bilirubin, Direct 0.1 0.1 - 0.5 mg/dL   Indirect Bilirubin 0.6 0.3 - 0.9 mg/dL  CBG monitoring, ED     Status: None   Collection Time: 09/09/17 12:40 PM  Result Value Ref Range   Glucose-Capillary 91 65 - 99 mg/dL  Glucose, capillary     Status: Abnormal   Collection Time: 09/09/17  4:21 PM  Result Value Ref Range   Glucose-Capillary 168 (H) 65 - 99 mg/dL  Glucose, capillary     Status: Abnormal   Collection Time: 09/09/17  9:05 PM  Result Value Ref Range   Glucose-Capillary 136 (H) 65 - 99 mg/dL   Dg Chest 2 View  Result Date: 09/08/2017 CLINICAL DATA:  Chest tightness and right-sided tingling today. History of heart murmur, hypertension, pre diabetes. EXAM: CHEST  2 VIEW COMPARISON:  None in PACs FINDINGS: The lungs are adequately inflated and clear. The heart and pulmonary vascularity are normal. The mediastinum is normal in width. There is no pleural effusion. The bony thorax exhibits no acute abnormality. IMPRESSION: There is no active cardiopulmonary disease. Electronically Signed   By: David  Jordan M.D.   On: 09/08/2017 15:25    Review Of Systems Constitutional: No fever, chills, weight loss or gain. Eyes: No vision change,  wears glasses. No discharge or pain. Ears: No hearing loss, No tinnitus. Respiratory: No asthma,   COPD, pneumonias. No shortness of breath. No hemoptysis. Cardiovascular: No chest pain, palpitation, leg edema. Gastrointestinal: No nausea, vomiting, diarrhea, constipation. No GI bleed. No hepatitis. Genitourinary: No dysuria, hematuria, kidney stone. No incontinance. Neurological: No headache, stroke, seizures.  Psychiatry: No psych facility admission for anxiety, depression, suicide. No detox. Skin: No rash. Musculoskeletal: No joint pain, fibromyalgia. No neck pain, back pain. Lymphadenopathy: No lymphadenopathy. Hematology: No anemia or easy bruising.   Blood pressure 130/68, pulse 73, temperature 98.5 F (36.9 C), temperature source Oral, resp. rate 18, height 5' 4" (1.626 m), weight 113.4 kg (250 lb), last menstrual period 08/29/2017, SpO2 98 %. Body mass index is 42.91 kg/m. General appearance: alert, cooperative, appears stated age and no distress Head: Normocephalic, atraumatic. Eyes: Brown eyes, pink conjunctiva, corneas clear. PERRL, EOM's intact. Neck: No adenopathy, no carotid bruit, no JVD, supple, symmetrical, trachea midline and thyroid not enlarged. Resp: Clear to auscultation bilaterally. Cardio: Regular rate and rhythm, S1, S2 normal, II/VI systolic murmur, no click, rub or gallop GI: Soft, non-tender; bowel sounds normal; no organomegaly. Extremities: No edema, cyanosis or clubbing. Skin: Warm and dry.  Neurologic: Alert and oriented X 3, normal strength. Normal coordination and gait.  Assessment/Plan Chest pain R/O CAD Hypertension Type II DM Morbid obesity  Nuclear stress test in AM  Gabrielle Balandran S Audrick Lamoureaux, MD  09/09/2017, 10:54 PM    

## 2017-09-09 NOTE — H&P (View-Only) (Signed)
Referring Physician:  MAILLE Gabrielle Perkins is an 41 y.o. female.                       Chief Complaint: Chest pain  HPI: 41 year old female has recurrent chest pain, central as well as left precordial along with exertional shortness of breath. She denies fever or cough. She has family history of MI to grand mother at age 24. She has PMH of hypertension, obesity and type II DM.  Past Medical History:  Diagnosis Date  . Diabetes mellitus without complication ([place])   . Hypertension       Past Surgical History:  Procedure Laterality Date  . btl      Family History  Problem Relation Age of Onset  . Coronary artery disease Maternal Grandmother    Social History:  reports that  has never smoked. She does not have any smokeless tobacco history on file. She reports that she does not drink alcohol. Her drug history is not on file.  Allergies: No Known Allergies  Medications Prior to Admission  Medication Sig Dispense Refill  . amLODipine (NORVASC) 10 MG tablet Take 10 mg by mouth daily.     Marland Kitchen aspirin EC 81 MG tablet Take 81 mg by mouth daily.    . Cetirizine HCl (ZYRTEC PO) Take 10 mg by mouth daily.     Marland Kitchen METFORMIN HCL PO Take 2 tablets by mouth at bedtime.     . metoprolol succinate (TOPROL XL) 50 MG 24 hr tablet Take 50 mg by mouth daily.    Marland Kitchen telmisartan-hydrochlorothiazide (MICARDIS HCT) 40-12.5 MG tablet Take 1 tablet by mouth daily.      Results for orders placed or performed during the hospital encounter of 09/08/17 (from the past 48 hour(s))  Basic metabolic panel     Status: Abnormal   Collection Time: 09/08/17  3:02 PM  Result Value Ref Range   Sodium 135 135 - 145 mmol/L   Potassium 3.2 (L) 3.5 - 5.1 mmol/L   Chloride 105 101 - 111 mmol/L   CO2 22 22 - 32 mmol/L   Glucose, Bld 226 (H) 65 - 99 mg/dL   BUN 5 (L) 6 - 20 mg/dL   Creatinine, Ser 0.80 0.44 - 1.00 mg/dL   Calcium 8.9 8.9 - 10.3 mg/dL   GFR calc non Af Amer >60 >60 mL/min   GFR calc Af Amer >60 >60 mL/min   Comment: (NOTE) The eGFR has been calculated using the CKD EPI equation. This calculation has not been validated in all clinical situations. eGFR's persistently <60 mL/min signify possible Chronic Kidney Disease.    Anion gap 8 5 - 15  CBC     Status: Abnormal   Collection Time: 09/08/17  3:02 PM  Result Value Ref Range   WBC 7.4 4.0 - 10.5 K/uL   RBC 4.20 3.87 - 5.11 MIL/uL   Hemoglobin 12.4 12.0 - 15.0 g/dL   HCT 38.5 36.0 - 46.0 %   MCV 91.7 78.0 - 100.0 fL   MCH 29.5 26.0 - 34.0 pg   MCHC 32.2 30.0 - 36.0 g/dL   RDW 13.4 11.5 - 15.5 %   Platelets 448 (H) 150 - 400 K/uL  Brain natriuretic peptide     Status: None   Collection Time: 09/08/17  3:02 PM  Result Value Ref Range   B Natriuretic Peptide 13.4 0.0 - 100.0 pg/mL  I-stat troponin, ED     Status: None  Collection Time: 09/08/17  3:06 PM  Result Value Ref Range   Troponin i, poc 0.00 0.00 - 0.08 ng/mL   Comment 3            Comment: Due to the release kinetics of cTnI, a negative result within the first hours of the onset of symptoms does not rule out myocardial infarction with certainty. If myocardial infarction is still suspected, repeat the test at appropriate intervals.   I-Stat beta hCG blood, ED     Status: None   Collection Time: 09/08/17  3:06 PM  Result Value Ref Range   I-stat hCG, quantitative <5.0 <5 mIU/mL   Comment 3            Comment:   GEST. AGE      CONC.  (mIU/mL)   <=1 WEEK        5 - 50     2 WEEKS       50 - 500     3 WEEKS       100 - 10,000     4 WEEKS     1,000 - 30,000        FEMALE AND NON-PREGNANT FEMALE:     LESS THAN 5 mIU/mL   Troponin I     Status: None   Collection Time: 09/08/17 10:37 PM  Result Value Ref Range   Troponin I <0.03 <0.03 ng/mL  CBG monitoring, ED     Status: Abnormal   Collection Time: 09/08/17 11:23 PM  Result Value Ref Range   Glucose-Capillary 133 (H) 65 - 99 mg/dL  Troponin I     Status: None   Collection Time: 09/09/17  5:39 AM  Result Value Ref  Range   Troponin I <0.03 <0.03 ng/mL  HIV antibody (Routine Testing)     Status: None   Collection Time: 09/09/17  5:39 AM  Result Value Ref Range   HIV Screen 4th Generation wRfx Non Reactive Non Reactive    Comment: (NOTE) Performed At: [facility] [place], [place] [id number] Rush Farmer MD BS:[id number]   Basic metabolic panel     Status: Abnormal   Collection Time: 09/09/17  5:39 AM  Result Value Ref Range   Sodium 135 135 - 145 mmol/L   Potassium 3.9 3.5 - 5.1 mmol/L   Chloride 107 101 - 111 mmol/L   CO2 21 (L) 22 - 32 mmol/L   Glucose, Bld 212 (H) 65 - 99 mg/dL   BUN 8 6 - 20 mg/dL   Creatinine, Ser 0.73 0.44 - 1.00 mg/dL   Calcium 8.5 (L) 8.9 - 10.3 mg/dL   GFR calc non Af Amer >60 >60 mL/min   GFR calc Af Amer >60 >60 mL/min    Comment: (NOTE) The eGFR has been calculated using the CKD EPI equation. This calculation has not been validated in all clinical situations. eGFR's persistently <60 mL/min signify possible Chronic Kidney Disease.    Anion gap 7 5 - 15  Magnesium     Status: None   Collection Time: 09/09/17  5:39 AM  Result Value Ref Range   Magnesium 1.9 1.7 - 2.4 mg/dL  Lipid panel     Status: Abnormal   Collection Time: 09/09/17  5:39 AM  Result Value Ref Range   Cholesterol 99 0 - 200 mg/dL   Triglycerides 105 <150 mg/dL   HDL 31 (L) >40 mg/dL   Total CHOL/HDL Ratio 3.2 RATIO   VLDL 21 0 - 40  mg/dL   LDL Cholesterol 47 0 - 99 mg/dL    Comment:        Total Cholesterol/HDL:CHD Risk Coronary Heart Disease Risk Table                     Men   Women  1/2 Average Risk   3.4   3.3  Average Risk       5.0   4.4  2 X Average Risk   9.6   7.1  3 X Average Risk  23.4   11.0        Use the calculated Patient Ratio above and the CHD Risk Table to determine the patient's CHD Risk.        ATP III CLASSIFICATION (LDL):  <100     mg/dL   Optimal  100-129  mg/dL   Near or Above                    Optimal  130-159  mg/dL    Borderline  160-189  mg/dL   High  >190     mg/dL   Very High   CBG monitoring, ED     Status: Abnormal   Collection Time: 09/09/17  8:28 AM  Result Value Ref Range   Glucose-Capillary 108 (H) 65 - 99 mg/dL  Troponin I     Status: None   Collection Time: 09/09/17  9:46 AM  Result Value Ref Range   Troponin I <0.03 <0.03 ng/mL  Lipase, blood     Status: None   Collection Time: 09/09/17  9:46 AM  Result Value Ref Range   Lipase 21 11 - 51 U/L  Hepatic function panel     Status: Abnormal   Collection Time: 09/09/17  9:46 AM  Result Value Ref Range   Total Protein 6.3 (L) 6.5 - 8.1 g/dL   Albumin 3.2 (L) 3.5 - 5.0 g/dL   AST 43 (H) 15 - 41 U/L   ALT 48 14 - 54 U/L   Alkaline Phosphatase 106 38 - 126 U/L   Total Bilirubin 0.7 0.3 - 1.2 mg/dL   Bilirubin, Direct 0.1 0.1 - 0.5 mg/dL   Indirect Bilirubin 0.6 0.3 - 0.9 mg/dL  CBG monitoring, ED     Status: None   Collection Time: 09/09/17 12:40 PM  Result Value Ref Range   Glucose-Capillary 91 65 - 99 mg/dL  Glucose, capillary     Status: Abnormal   Collection Time: 09/09/17  4:21 PM  Result Value Ref Range   Glucose-Capillary 168 (H) 65 - 99 mg/dL  Glucose, capillary     Status: Abnormal   Collection Time: 09/09/17  9:05 PM  Result Value Ref Range   Glucose-Capillary 136 (H) 65 - 99 mg/dL   Dg Chest 2 View  Result Date: 09/08/2017 CLINICAL DATA:  Chest tightness and right-sided tingling today. History of heart murmur, hypertension, pre diabetes. EXAM: CHEST  2 VIEW COMPARISON:  None in PACs FINDINGS: The lungs are adequately inflated and clear. The heart and pulmonary vascularity are normal. The mediastinum is normal in width. There is no pleural effusion. The bony thorax exhibits no acute abnormality. IMPRESSION: There is no active cardiopulmonary disease. Electronically Signed   By: David  Martinique M.D.   On: 09/08/2017 15:25    Review Of Systems Constitutional: No fever, chills, weight loss or gain. Eyes: No vision change,  wears glasses. No discharge or pain. Ears: No hearing loss, No tinnitus. Respiratory: No asthma,  COPD, pneumonias. No shortness of breath. No hemoptysis. Cardiovascular: No chest pain, palpitation, leg edema. Gastrointestinal: No nausea, vomiting, diarrhea, constipation. No GI bleed. No hepatitis. Genitourinary: No dysuria, hematuria, kidney stone. No incontinance. Neurological: No headache, stroke, seizures.  Psychiatry: No psych facility admission for anxiety, depression, suicide. No detox. Skin: No rash. Musculoskeletal: No joint pain, fibromyalgia. No neck pain, back pain. Lymphadenopathy: No lymphadenopathy. Hematology: No anemia or easy bruising.   Blood pressure 130/68, pulse 73, temperature 98.5 F (36.9 C), temperature source Oral, resp. rate 18, height _0  (1.626 m), weight 113.4 kg (250 lb), last menstrual period 08/29/2017, SpO2 98 %. Body mass index is 42.91 kg/m. General appearance: alert, cooperative, appears stated age and no distress Head: Normocephalic, atraumatic. Eyes: Brown eyes, pink conjunctiva, corneas clear. PERRL, EOM's intact. Neck: No adenopathy, no carotid bruit, no JVD, supple, symmetrical, trachea midline and thyroid not enlarged. Resp: Clear to auscultation bilaterally. Cardio: Regular rate and rhythm, S1, S2 normal, II/VI systolic murmur, no click, rub or gallop GI: Soft, non-tender; bowel sounds normal; no organomegaly. Extremities: No edema, cyanosis or clubbing. Skin: Warm and dry.  Neurologic: Alert and oriented X 3, normal strength. Normal coordination and gait.  Assessment/Plan Chest pain R/O CAD Hypertension Type II DM Morbid obesity  Nuclear stress test in AM  Birdie Riddle, MD  09/09/2017, 10:54 PM

## 2017-09-09 NOTE — ED Notes (Deleted)
Heart Healthy diet breakfast tray ordered @ 0727.  

## 2017-09-09 NOTE — ED Notes (Signed)
Heart Healthy Diet ordered for lunch.  

## 2017-09-09 NOTE — Progress Notes (Signed)
PROGRESS NOTE    Gabrielle Perkins  OFB:[id number] DOB: 05/31/1976 DOA: 09/08/2017 PCP: Glendale Chard, MD    Brief Narrative: Gabrielle Perkins is a 41 y.o. female with medical history significant for hypertension and type 2 diabetes mellitus, now presenting to the emergency department for evaluation of chest discomfort.  She reports that she woke this morning with a "tightness" in her central chest symptoms were mild she suspected that it was secondary to indigestion.  The discomfort eventually eased off, but recurred several times throughout the day while she was at work.  There did not seem to be any worsening with exertion and no alleviating or exacerbating factors were identified.  She was perhaps mildly short of breath during the episodes and there was nausea with episode of vomiting associated with this.  She had never experienced these symptoms previously.  Went to bed last night in her usual state and was, or chills.  She reports that her maternal grandmother coronary artery disease with a first MI in her early 41s.  ED Course: Upon arrival to the ED, patient is found to be afebrile, saturating well on room air, and.  EKG features a normal sinus rhythm with nonspecific T wave abnormality in diffuse leads.  Chest x-ray is negative for acute cardiopulmonary disease and troponin is undetectable.  Mr. panel is notable for a potassium of 3.2 and CBC features a slight thrombocytosis with platelets 148,000.  Patient was treated with 324 mg of aspirin, sublingual nitroglycerin, and 40 mEq oral potassium.  She reported resolution of her pain after the sublingual nitroglycerin, pain later recurred in the emergency department and an inch of nitroglycerin ointment was applied.  Patient remains hemodynamically stable and in no apparent respiratory distress.  She will be observed on the telemetry unit for ongoing evaluation and management of atypical chest pain with risk factors for CAD and EKG abnormalities  with no prior available for comparison.    Assessment & Plan:   Principal Problem:   Chest pain Active Problems:   Hypertension   Diabetes mellitus without complication (HCC)   Hypokalemia  1-Chest pain;  Troponin negative.  Check ECHO.  Multiples risk factors. Cardiology consulted.  Aspirin, statin, BB.   2-HTN;  Continue with toprol, telmisartan and HCTZ,.   3-DM type 2;  On metformin. Hold while in patient.   Hypokalemia resolved.   DVT prophylaxis: lovenox Code Status:  Full code.  Family Communication: care discussed with patient.  Disposition Plan: home at time of discharge    Consultants:   Cardiology    Procedures: echo   Subjective: Relates chest pain episode yesterday was sharp middle chest, also had SOB and right arm numbness,  She has had prior chest pain few days ago.  She related dyspnea on exertion   Objective: Vitals:   09/09/17 0920 09/09/17 1122 09/09/17 1258 09/09/17 1328  BP: 113/72  (!) 130/91 (!) 141/92  Pulse: 66 62 63 66  Resp: 14  12 14   Temp:   98.1 F (36.7 C) 98.1 F (36.7 C)  TempSrc:   Oral Oral  SpO2: 94%  98%   Weight:      Height:        Intake/Output Summary (Last 24 hours) at 09/09/2017 1639 Last data filed at 09/08/2017 2357 Gross per 24 hour  Intake 50 ml  Output -  Net 50 ml   Filed Weights   09/08/17 1444  Weight: 113.4 kg (250 lb)    Examination:  General exam:  Appears calm and comfortable  Respiratory system: Clear to auscultation. Respiratory effort normal. Cardiovascular system: S1 & S2 heard, RRR. No JVD, murmurs, rubs, gallops or clicks. No pedal edema. Gastrointestinal system: Abdomen is nondistended, soft and nontender. No organomegaly or masses felt. Normal bowel sounds heard. Central nervous system: Alert and oriented. No focal neurological deficits. Extremities: Symmetric 5 x 5 power. Skin: No rashes, lesions or ulcers Psychiatry: Judgement and insight appear normal. Mood & affect  appropriate.     Data Reviewed: I have personally reviewed following labs and imaging studies  CBC: Recent Labs  Lab 09/08/17 1502  WBC 7.4  HGB 12.4  HCT 38.5  MCV 91.7  PLT 967*   Basic Metabolic Panel: Recent Labs  Lab 09/08/17 1502 09/09/17 0539  NA 135 135  K 3.2* 3.9  CL 105 107  CO2 22 21*  GLUCOSE 226* 212*  BUN 5* 8  CREATININE 0.80 0.73  CALCIUM 8.9 8.5*  MG  --  1.9   GFR: Estimated Creatinine Clearance: 115.4 mL/min (by C-G formula based on SCr of 0.73 mg/dL). Liver Function Tests: Recent Labs  Lab 09/09/17 0946  AST 43*  ALT 48  ALKPHOS 106  BILITOT 0.7  PROT 6.3*  ALBUMIN 3.2*   Recent Labs  Lab 09/09/17 0946  LIPASE 21   No results for input(s): AMMONIA in the last 168 hours. Coagulation Profile: No results for input(s): INR, PROTIME in the last 168 hours. Cardiac Enzymes: Recent Labs  Lab 09/08/17 2237 09/09/17 0539 09/09/17 0946  TROPONINI <0.03 <0.03 <0.03   BNP (last 3 results) No results for input(s): PROBNP in the last 8760 hours. HbA1C: No results for input(s): HGBA1C in the last 72 hours. CBG: Recent Labs  Lab 09/08/17 2323 09/09/17 0828 09/09/17 1240 09/09/17 1621  GLUCAP 133* 108* 91 168*   Lipid Profile: Recent Labs    09/09/17 0539  CHOL 99  HDL 31*  LDLCALC 47  TRIG 105  CHOLHDL 3.2   Thyroid Function Tests: No results for input(s): TSH, T4TOTAL, FREET4, T3FREE, THYROIDAB in the last 72 hours. Anemia Panel: No results for input(s): VITAMINB12, FOLATE, FERRITIN, TIBC, IRON, RETICCTPCT in the last 72 hours. Sepsis Labs: No results for input(s): PROCALCITON, LATICACIDVEN in the last 168 hours.  No results found for this or any previous visit (from the past 240 hour(s)).       Radiology Studies: Dg Chest 2 View  Result Date: 09/08/2017 CLINICAL DATA:  Chest tightness and right-sided tingling today. History of heart murmur, hypertension, pre diabetes. EXAM: CHEST  2 VIEW COMPARISON:  None in  PACs FINDINGS: The lungs are adequately inflated and clear. The heart and pulmonary vascularity are normal. The mediastinum is normal in width. There is no pleural effusion. The bony thorax exhibits no acute abnormality. IMPRESSION: There is no active cardiopulmonary disease. Electronically Signed   By: David  Martinique M.D.   On: 09/08/2017 15:25        Scheduled Meds: . aspirin EC  81 mg Oral Daily  . enoxaparin (LOVENOX) injection  40 mg Subcutaneous Daily  . famotidine  20 mg Oral BID  . irbesartan  150 mg Oral Daily   And  . hydrochlorothiazide  12.5 mg Oral Daily  . insulin aspart  0-5 Units Subcutaneous QHS  . insulin aspart  0-9 Units Subcutaneous TID WC  . metoprolol tartrate  25 mg Oral BID   Continuous Infusions:   LOS: 0 days    Time spent: 35 minutes  Elmarie Shiley, MD [facility] Pager [phone]  If 7PM-7AM, please contact night-coverage [email or web address] Password [facility] 09/09/2017, 4:39 PM

## 2017-09-10 ENCOUNTER — Observation Stay (HOSPITAL_COMMUNITY)

## 2017-09-10 ENCOUNTER — Telehealth: Payer: Self-pay | Admitting: Physician Assistant

## 2017-09-10 DIAGNOSIS — Z7982 Long term (current) use of aspirin: Secondary | ICD-10-CM | POA: Diagnosis not present

## 2017-09-10 DIAGNOSIS — Z6838 Body mass index (BMI) 38.0-38.9, adult: Secondary | ICD-10-CM | POA: Diagnosis not present

## 2017-09-10 DIAGNOSIS — I071 Rheumatic tricuspid insufficiency: Secondary | ICD-10-CM | POA: Diagnosis not present

## 2017-09-10 DIAGNOSIS — R112 Nausea with vomiting, unspecified: Secondary | ICD-10-CM | POA: Diagnosis not present

## 2017-09-10 DIAGNOSIS — R0609 Other forms of dyspnea: Secondary | ICD-10-CM | POA: Diagnosis not present

## 2017-09-10 DIAGNOSIS — E119 Type 2 diabetes mellitus without complications: Secondary | ICD-10-CM | POA: Diagnosis not present

## 2017-09-10 DIAGNOSIS — Z7984 Long term (current) use of oral hypoglycemic drugs: Secondary | ICD-10-CM | POA: Diagnosis not present

## 2017-09-10 DIAGNOSIS — Z8249 Family history of ischemic heart disease and other diseases of the circulatory system: Secondary | ICD-10-CM | POA: Diagnosis not present

## 2017-09-10 DIAGNOSIS — R202 Paresthesia of skin: Secondary | ICD-10-CM | POA: Diagnosis not present

## 2017-09-10 DIAGNOSIS — I10 Essential (primary) hypertension: Secondary | ICD-10-CM | POA: Diagnosis not present

## 2017-09-10 DIAGNOSIS — R0789 Other chest pain: Secondary | ICD-10-CM | POA: Diagnosis not present

## 2017-09-10 DIAGNOSIS — E876 Hypokalemia: Secondary | ICD-10-CM | POA: Diagnosis not present

## 2017-09-10 DIAGNOSIS — R079 Chest pain, unspecified: Secondary | ICD-10-CM | POA: Diagnosis not present

## 2017-09-10 LAB — GLUCOSE, CAPILLARY
GLUCOSE-CAPILLARY: 127 mg/dL — AB (ref 65–99)
Glucose-Capillary: 136 mg/dL — ABNORMAL HIGH (ref 65–99)
Glucose-Capillary: 93 mg/dL (ref 65–99)
Glucose-Capillary: 196 mg/dL — ABNORMAL HIGH (ref 65–99)

## 2017-09-10 MED ORDER — REGADENOSON 0.4 MG/5ML IV SOLN
0.4000 mg | Freq: Once | INTRAVENOUS | Status: AC
  Administered 2017-09-10: 0.4 mg via INTRAVENOUS
  Filled 2017-09-10: qty 5

## 2017-09-10 MED ORDER — TECHNETIUM TC 99M TETROFOSMIN IV KIT
30.0000 | PACK | Freq: Once | INTRAVENOUS | Status: AC | PRN
  Administered 2017-09-10: 30 via INTRAVENOUS

## 2017-09-10 MED ORDER — TECHNETIUM TC 99M TETROFOSMIN IV KIT
10.0000 | PACK | Freq: Once | INTRAVENOUS | Status: AC | PRN
  Administered 2017-09-10: 10 via INTRAVENOUS

## 2017-09-10 MED ORDER — REGADENOSON 0.4 MG/5ML IV SOLN
INTRAVENOUS | Status: AC
  Filled 2017-09-10: qty 5

## 2017-09-10 NOTE — Telephone Encounter (Signed)
Patient was completely out of furosemide, needs prescription sent to both mail order and local pharmacy so she does not go without.  Prescriptions sent into both places.  No other issues or concerns.  Rosaria Ferries, PA-C 09/10/2017 8:56 AM Beeper [phone]

## 2017-09-10 NOTE — Progress Notes (Signed)
PROGRESS NOTE    Gabrielle Perkins  HAF:[id number] DOB: 1975/12/13 DOA: 09/08/2017 PCP: Glendale Chard, MD    Brief Narrative: Gabrielle Perkins is a 41 y.o. female with medical history significant for hypertension and type 2 diabetes mellitus, now presenting to the emergency department for evaluation of chest discomfort.  She reports that she woke this morning with a "tightness" in her central chest symptoms were mild she suspected that it was secondary to indigestion.  The discomfort eventually eased off, but recurred several times throughout the day while she was at work.  There did not seem to be any worsening with exertion and no alleviating or exacerbating factors were identified.  She was perhaps mildly short of breath during the episodes and there was nausea with episode of vomiting associated with this.  She had never experienced these symptoms previously.  Went to bed last night in her usual state and was, or chills.  She reports that her maternal grandmother coronary artery disease with a first MI in her early 14s.  ED Course: Upon arrival to the ED, patient is found to be afebrile, saturating well on room air, and.  EKG features a normal sinus rhythm with nonspecific T wave abnormality in diffuse leads.  Chest x-ray is negative for acute cardiopulmonary disease and troponin is undetectable. B-met s notable for a potassium of 3.2 and CBC features a slight thrombocytosis with platelets 148,000.  Patient was treated with 324 mg of aspirin, sublingual nitroglycerin, and 40 mEq oral potassium.  She reported resolution of her pain after the sublingual nitroglycerin, pain later recurred in the emergency department and an inch of nitroglycerin ointment was applied.  Patient remains hemodynamically stable and in no apparent respiratory distress.  She will be observed on the telemetry unit for ongoing evaluation and management of atypical chest pain with risk factors for CAD and EKG abnormalities with no  prior available for comparison.  Admitted with chest pain, stress test with area of focal reversible ischemia. Cardiology recommending Cath.   Assessment & Plan:   Principal Problem:   Chest pain Active Problems:   Hypertension   Diabetes mellitus without complication (HCC)   Hypokalemia  1-Chest pain;  Troponin negative.  ECHO; EF normal./  Multiples risk factors. Cardiology consulted.  Aspirin, statin, BB.  Stress test with area of reversible ischemia. Cardiology recommending Cath.   2-HTN;  Continue with toprol, telmisartan and HCTZ,.   3-DM type 2;  On metformin. Hold while in patient.   Hypokalemia resolved.   DVT prophylaxis: lovenox Code Status:  Full code.  Family Communication: care discussed with patient.  Disposition Plan: home at time of discharge    Consultants:   Cardiology    Procedures: echo   Subjective: She is feeling ok, denies chest pain   Objective: Vitals:   09/10/17 1022 09/10/17 1023 09/10/17 1025 09/10/17 1245  BP: (!) 168/84 (!) 153/76 (!) 158/78   Pulse:      Resp: '16 20 20 12  ' Temp:      TempSrc:      SpO2:      Weight:      Height:        Intake/Output Summary (Last 24 hours) at 09/10/2017 1422 Last data filed at 09/10/2017 1300 Gross per 24 hour  Intake 480 ml  Output -  Net 480 ml   Filed Weights   09/08/17 1444 09/10/17 0511  Weight: 113.4 kg (250 lb) 101.3 kg (223 lb 6.4 oz)    Examination:  General exam: NAD Respiratory system: CTA Cardiovascular system: S 1, S 2 RRR Gastrointestinal system: BS present, soft, nt Central nervous system: alert and oriented.  Extremities: Symmetric power.    Data Reviewed: I have personally reviewed following labs and imaging studies  CBC: Recent Labs  Lab 09/08/17 1502  WBC 7.4  HGB 12.4  HCT 38.5  MCV 91.7  PLT 154*   Basic Metabolic Panel: Recent Labs  Lab 09/08/17 1502 09/09/17 0539  NA 135 135  K 3.2* 3.9  CL 105 107  CO2 22 21*  GLUCOSE 226* 212*    BUN 5* 8  CREATININE 0.80 0.73  CALCIUM 8.9 8.5*  MG  --  1.9   GFR: Estimated Creatinine Clearance: 108.2 mL/min (by C-G formula based on SCr of 0.73 mg/dL). Liver Function Tests: Recent Labs  Lab 09/09/17 0946  AST 43*  ALT 48  ALKPHOS 106  BILITOT 0.7  PROT 6.3*  ALBUMIN 3.2*   Recent Labs  Lab 09/09/17 0946  LIPASE 21   No results for input(s): AMMONIA in the last 168 hours. Coagulation Profile: No results for input(s): INR, PROTIME in the last 168 hours. Cardiac Enzymes: Recent Labs  Lab 09/08/17 2237 09/09/17 0539 09/09/17 0946  TROPONINI <0.03 <0.03 <0.03   BNP (last 3 results) No results for input(s): PROBNP in the last 8760 hours. HbA1C: No results for input(s): HGBA1C in the last 72 hours. CBG: Recent Labs  Lab 09/09/17 1240 09/09/17 1621 09/09/17 2105 09/10/17 0731 09/10/17 1141  GLUCAP 91 168* 136* 127* 136*   Lipid Profile: Recent Labs    09/09/17 0539  CHOL 99  HDL 31*  LDLCALC 47  TRIG 105  CHOLHDL 3.2   Thyroid Function Tests: No results for input(s): TSH, T4TOTAL, FREET4, T3FREE, THYROIDAB in the last 72 hours. Anemia Panel: No results for input(s): VITAMINB12, FOLATE, FERRITIN, TIBC, IRON, RETICCTPCT in the last 72 hours. Sepsis Labs: No results for input(s): PROCALCITON, LATICACIDVEN in the last 168 hours.  No results found for this or any previous visit (from the past 240 hour(s)).       Radiology Studies: Dg Chest 2 View  Result Date: 09/08/2017 CLINICAL DATA:  Chest tightness and right-sided tingling today. History of heart murmur, hypertension, pre diabetes. EXAM: CHEST  2 VIEW COMPARISON:  None in PACs FINDINGS: The lungs are adequately inflated and clear. The heart and pulmonary vascularity are normal. The mediastinum is normal in width. There is no pleural effusion. The bony thorax exhibits no acute abnormality. IMPRESSION: There is no active cardiopulmonary disease. Electronically Signed   By: David  Martinique M.D.    On: 09/08/2017 15:25   Nm Myocar Multi W/spect W/wall Motion / Ef  Addendum Date: 09/10/2017   ADDENDUM REPORT: 09/10/2017 13:53 ADDENDUM: Please correct the first paragraph in the Findings section to read as follows: Perfusion: Small area of mildly decreased activity in the proximal anterior wall on the post Lexiscan images with reversibility on the resting images. No abnormal activity elsewhere. Electronically Signed   By: Evangeline Dakin M.D.   On: 09/10/2017 13:53   Result Date: 09/10/2017 CLINICAL DATA:  41 year old presenting with chest pain. Risk factors for coronary artery disease include hypertension, diabetes and obesity. EXAM: MYOCARDIAL IMAGING WITH SPECT (REST AND PHARMACOLOGIC-STRESS) GATED LEFT VENTRICULAR WALL MOTION STUDY LEFT VENTRICULAR EJECTION FRACTION TECHNIQUE: Standard myocardial SPECT imaging was performed after resting intravenous injection of 10 mCi Tc-57mtetrofosmin. Subsequently, intravenous infusion of Lexiscan was performed under the supervision of the Cardiology staff. At  peak effect of the drug, 30 mCi Tc-47mtetrofosmin was injected intravenously and standard myocardial SPECT imaging was performed. Quantitative gated imaging was also performed to evaluate left ventricular wall motion, and estimate left ventricular ejection fraction. COMPARISON:  None. FINDINGS: Perfusion: Small area of mildly decreased activity in the proximal anterior wall on the resting images with reversibility on the post Lexiscan images. No abnormal activity elsewhere. Wall Motion: Normal left ventricular wall motion. No left ventricular dilation. Left Ventricular Ejection Fraction: 541%End diastolic volume 1937ml End systolic volume 57 ml IMPRESSION: 1. Small area of reversibility in the proximal anterior wall which may indicate focal ischemia. 2. Normal left ventricular wall motion. 3. Left ventricular ejection fraction 51% 4. Non invasive risk stratification*: Low. *2012 Appropriate Use Criteria  for Coronary Revascularization Focused Update: J Am Coll Cardiol. 29024;09(7):353-299 http://content.[email or web address]?[email or web address] Electronically Signed: By: TEvangeline DakinM.D. On: 09/10/2017 13:22        Scheduled Meds: . regadenoson      . aspirin EC  81 mg Oral Daily  . enoxaparin (LOVENOX) injection  40 mg Subcutaneous Daily  . famotidine  20 mg Oral BID  . irbesartan  150 mg Oral Daily   And  . hydrochlorothiazide  12.5 mg Oral Daily  . insulin aspart  0-5 Units Subcutaneous QHS  . insulin aspart  0-9 Units Subcutaneous TID WC  . metoprolol tartrate  25 mg Oral BID   Continuous Infusions:   LOS: 0 days    Time spent: 35 minutes    BElmarie Shiley MD [facility] Pager [phone] If 7PM-7AM, please contact night-coverage [email or web address] Password [email or web address] 09/10/2017, 2:22 PM

## 2017-09-10 NOTE — Progress Notes (Signed)
Subjective:  Patient denies any chest pain or shortness of breath MI ruled out by serial enzymes and EKG.  Objective:  Vital Signs in the last 24 hours: Temp:  [97.4 F (36.3 C)-98.5 F (36.9 C)] 97.4 F (36.3 C) (12/22 0511) Pulse Rate:  [63-73] 64 (12/22 0511) Resp:  [12-20] 20 (12/22 1025) BP: (127-168)/(68-99) 158/78 (12/22 1025) SpO2:  [98 %] 98 % (12/22 0511) Weight:  [101.3 kg (223 lb 6.4 oz)] 101.3 kg (223 lb 6.4 oz) (12/22 0511)  Intake/Output from previous day: 12/21 0701 - 12/22 0700 In: 240 [P.O.:240] Out: -  Intake/Output from this shift: No intake/output data recorded.  Physical Exam: Neck: no adenopathy, no carotid bruit, no JVD and supple, symmetrical, trachea midline Lungs: clear to auscultation bilaterally Heart: regular rate and rhythm, S1, S2 normal and Soft systolic murmur noted no S3 gallop Abdomen: soft, non-tender; bowel sounds normal; no masses,  no organomegaly Extremities: extremities normal, atraumatic, no cyanosis or edema  Lab Results: Recent Labs    09/08/17 1502  WBC 7.4  HGB 12.4  PLT 448*   Recent Labs    09/08/17 1502 09/09/17 0539  NA 135 135  K 3.2* 3.9  CL 105 107  CO2 22 21*  GLUCOSE 226* 212*  BUN 5* 8  CREATININE 0.80 0.73   Recent Labs    09/09/17 0539 09/09/17 0946  TROPONINI <0.03 <0.03   Hepatic Function Panel Recent Labs    09/09/17 0946  PROT 6.3*  ALBUMIN 3.2*  AST 43*  ALT 48  ALKPHOS 106  BILITOT 0.7  BILIDIR 0.1  IBILI 0.6   Recent Labs    09/09/17 0539  CHOL 99   No results for input(s): PROTIME in the last 72 hours.  Imaging: Imaging results have been reviewed and Dg Chest 2 View  Result Date: 09/08/2017 CLINICAL DATA:  Chest tightness and right-sided tingling today. History of heart murmur, hypertension, pre diabetes. EXAM: CHEST  2 VIEW COMPARISON:  None in PACs FINDINGS: The lungs are adequately inflated and clear. The heart and pulmonary vascularity are normal. The mediastinum is  normal in width. There is no pleural effusion. The bony thorax exhibits no acute abnormality. IMPRESSION: There is no active cardiopulmonary disease. Electronically Signed   By: David  Martinique M.D.   On: 09/08/2017 15:25    Cardiac Studies:  Assessment/Plan:  Status post chest pain MI ruled out Hypertension Type 2 diabetes mellitus Morbid obesity Plan Continue present management Check Lexiscan Myoview results if no evidence of ischemia okay to discharge from cardiac point of view follow-up with  Dr. Doylene Canard as outpatient in 1-2 weeks  LOS: 0 days    Charolette Forward 09/10/2017, 11:52 AM

## 2017-09-10 NOTE — Progress Notes (Signed)
Previous note is in error, wrong pt   Rosaria Ferries, PA-C 09/10/2017 9:15 AM Beeper [phone]

## 2017-09-10 NOTE — Plan of Care (Signed)
VSS. Plan cath on Monday d/t Stress Test results today. No c/o  Chest Pain today.

## 2017-09-11 DIAGNOSIS — E876 Hypokalemia: Secondary | ICD-10-CM

## 2017-09-11 DIAGNOSIS — I10 Essential (primary) hypertension: Secondary | ICD-10-CM

## 2017-09-11 DIAGNOSIS — R079 Chest pain, unspecified: Secondary | ICD-10-CM

## 2017-09-11 DIAGNOSIS — E119 Type 2 diabetes mellitus without complications: Secondary | ICD-10-CM

## 2017-09-11 LAB — GLUCOSE, CAPILLARY
Glucose-Capillary: 125 mg/dL — ABNORMAL HIGH (ref 65–99)
Glucose-Capillary: 89 mg/dL (ref 65–99)
Glucose-Capillary: 142 mg/dL — ABNORMAL HIGH (ref 65–99)
Glucose-Capillary: 120 mg/dL — ABNORMAL HIGH (ref 65–99)

## 2017-09-11 LAB — HEMOGLOBIN A1C
Hgb A1c MFr Bld: 7.2 % — ABNORMAL HIGH (ref 4.8–5.6)
Mean Plasma Glucose: 159.94 mg/dL

## 2017-09-11 MED ORDER — SODIUM CHLORIDE 0.9 % WEIGHT BASED INFUSION
1.0000 mL/kg/h | INTRAVENOUS | Status: DC
  Administered 2017-09-11 – 2017-09-12 (×2): 1 mL/kg/h via INTRAVENOUS

## 2017-09-11 MED ORDER — SODIUM CHLORIDE 0.9 % IV SOLN: 250.0000 mL | INTRAVENOUS | Status: DC | PRN

## 2017-09-11 MED ORDER — SODIUM CHLORIDE 0.9% FLUSH
3.0000 mL | Freq: Two times a day (BID) | INTRAVENOUS | Status: DC
  Administered 2017-09-11: 3 mL via INTRAVENOUS

## 2017-09-11 MED ORDER — SODIUM CHLORIDE 0.9% FLUSH: 3.0000 mL | INTRAVENOUS | Status: DC | PRN

## 2017-09-11 MED ORDER — ASPIRIN 81 MG PO CHEW
81.0000 mg | CHEWABLE_TABLET | ORAL | Status: AC
  Administered 2017-09-12: 81 mg via ORAL
  Filled 2017-09-11: qty 1

## 2017-09-11 MED ORDER — CLOPIDOGREL BISULFATE 75 MG PO TABS
300.0000 mg | ORAL_TABLET | Freq: Once | ORAL | Status: AC
  Administered 2017-09-12: 300 mg via ORAL
  Filled 2017-09-11: qty 4

## 2017-09-11 MED ORDER — CLOPIDOGREL BISULFATE 75 MG PO TABS: 300.0000 mg | ORAL_TABLET | Freq: Once | ORAL | Status: DC

## 2017-09-11 NOTE — Progress Notes (Signed)
Subjective:  Patient denies any chest pain or shortness of breath underwent Lexiscan Myoview yesterday showed small area of proximal anterior wall ischemia with EF of 51%.  Objective:  Vital Signs in the last 24 hours: Temp:  [98.2 F (36.8 C)-98.5 F (36.9 C)] 98.5 F (36.9 C) (12/23 0620) Pulse Rate:  [64-79] 64 (12/23 0620) Resp:  [12-19] 19 (12/23 0620) BP: (124-136)/(78-91) 126/91 (12/23 0620) SpO2:  [95 %-98 %] 95 % (12/23 0620)  Intake/Output from previous day: 12/22 0701 - 12/23 0700 In: 600 [P.O.:600] Out: -  Intake/Output from this shift: No intake/output data recorded.  Physical Exam: Neck: no adenopathy, no carotid bruit, no JVD and supple, symmetrical, trachea midline Lungs: clear to auscultation bilaterally Heart: regular rate and rhythm, S1, S2 normal and Soft systolic murmur noted no S3 gallop Abdomen: soft, non-tender; bowel sounds normal; no masses,  no organomegaly Extremities: extremities normal, atraumatic, no cyanosis or edema  Lab Results: Recent Labs    09/08/17 1502  WBC 7.4  HGB 12.4  PLT 448*   Recent Labs    09/08/17 1502 09/09/17 0539  NA 135 135  K 3.2* 3.9  CL 105 107  CO2 22 21*  GLUCOSE 226* 212*  BUN 5* 8  CREATININE 0.80 0.73   Recent Labs    09/09/17 0539 09/09/17 0946  TROPONINI <0.03 <0.03   Hepatic Function Panel Recent Labs    09/09/17 0946  PROT 6.3*  ALBUMIN 3.2*  AST 43*  ALT 48  ALKPHOS 106  BILITOT 0.7  BILIDIR 0.1  IBILI 0.6   Recent Labs    09/09/17 0539  CHOL 99   No results for input(s): PROTIME in the last 72 hours.  Imaging: Imaging results have been reviewed and Nm Myocar Multi W/spect W/wall Motion / Ef  Addendum Date: 09/10/2017   ADDENDUM REPORT: 09/10/2017 13:53 ADDENDUM: Please correct the first paragraph in the Findings section to read as follows: Perfusion: Small area of mildly decreased activity in the proximal anterior wall on the post Lexiscan images with reversibility on the  resting images. No abnormal activity elsewhere. Electronically Signed   By: Evangeline Dakin M.D.   On: 09/10/2017 13:53   Result Date: 09/10/2017 CLINICAL DATA:  41 year old presenting with chest pain. Risk factors for coronary artery disease include hypertension, diabetes and obesity. EXAM: MYOCARDIAL IMAGING WITH SPECT (REST AND PHARMACOLOGIC-STRESS) GATED LEFT VENTRICULAR WALL MOTION STUDY LEFT VENTRICULAR EJECTION FRACTION TECHNIQUE: Standard myocardial SPECT imaging was performed after resting intravenous injection of 10 mCi Tc-80m tetrofosmin. Subsequently, intravenous infusion of Lexiscan was performed under the supervision of the Cardiology staff. At peak effect of the drug, 30 mCi Tc-77m tetrofosmin was injected intravenously and standard myocardial SPECT imaging was performed. Quantitative gated imaging was also performed to evaluate left ventricular wall motion, and estimate left ventricular ejection fraction. COMPARISON:  None. FINDINGS: Perfusion: Small area of mildly decreased activity in the proximal anterior wall on the resting images with reversibility on the post Lexiscan images. No abnormal activity elsewhere. Wall Motion: Normal left ventricular wall motion. No left ventricular dilation. Left Ventricular Ejection Fraction: 80% End diastolic volume 165 ml End systolic volume 57 ml IMPRESSION: 1. Small area of reversibility in the proximal anterior wall which may indicate focal ischemia. 2. Normal left ventricular wall motion. 3. Left ventricular ejection fraction 51% 4. Non invasive risk stratification*: Low. *2012 Appropriate Use Criteria for Coronary Revascularization Focused Update: J Am Coll Cardiol. 5374;82(7):078-675. http://[email or web address]?[email or web address] Electronically Signed: By: Evangeline Dakin M.D. On: 09/10/2017 13:22  Cardiac Studies:  Assessment/Plan:  New-onset angina/abnormal nuclear stress test rule out coronary insufficiency Hypertension Type  2 diabetes mellitus Morbid obesity Plan Discussed with patient and her family regarding abnormal nuclear stress test and left cardiac catheterization possible PTCA stenting its risk and benefits i.e. death MI stroke need for emergency CABG local vascular complications etc. and consents for PCI   LOS: 1 day    Gabrielle Perkins 09/11/2017, 11:13 AM

## 2017-09-11 NOTE — Plan of Care (Signed)
Pt is A&O x 4, VSS, follows commands, uses call light appropriately. Pt has family in the room with her at all times for support and assistance. Pt and family instructed to call RN/NT on [facility] phone using numbers provided on [facility] board and she verbalized understanding, will continue to monitor.

## 2017-09-11 NOTE — Progress Notes (Addendum)
PROGRESS NOTE    Gabrielle Perkins  EUM:[id number] DOB: August 12, 1976 DOA: 09/08/2017 PCP: Glendale Chard, MD   Brief Narrative: Gabrielle Perkins is a 41 y.o. femalewith medical history significant forhypertension and type 2 diabetes mellitus. She presented with chest pain. Nuclear chest pain abnormal and patient is scheduled for cath on 12/24. Chest pain resolved.   Assessment & Plan:   Principal Problem:   Chest pain Active Problems:   Hypertension   Diabetes mellitus without complication (HCC)   Hypokalemia   Chest pain Patient underwent nuclear stress test which was abnormal. No chest pain -Cardiology recommendations: Cath planned for 12/24 -NPO after midnight  Essential hypertension Continue irbesartan, hydrochlorothiazide, and metoprolol  Diabetes mellitus, type 2 Patient is metformin as an outpatient. Unknown  Hemoglobin A1C -Hemoglobin A1C -Continue SSI -hold metformin for 48 hours post-cath on discharge  Morbid obesity   DVT prophylaxis: Lovenox Code Status: Full code Family Communication: Husband at bedside Disposition Plan: Discharge pending cath results   Consultants:   Cardiology  Procedures:   Nuclear stress test  Antimicrobials:  None    Subjective: No chest pain or dyspnea.  Objective: Vitals:   09/10/17 2114 09/10/17 2156 09/11/17 0620 09/11/17 1346  BP: 136/79 124/82 (!) 126/91 135/85  Pulse: 70 72 64 66  Resp: 19  19 18   Temp: 98.2 F (36.8 C)  98.5 F (36.9 C) 98.9 F (37.2 C)  TempSrc: Oral  Oral Oral  SpO2: 97%  95% 97%  Weight:      Height:        Intake/Output Summary (Last 24 hours) at 09/11/2017 1639 Last data filed at 09/10/2017 2200 Gross per 24 hour  Intake 360 ml  Output -  Net 360 ml   Filed Weights   09/08/17 1444 09/10/17 0511  Weight: 113.4 kg (250 lb) 101.3 kg (223 lb 6.4 oz)    Examination:  General exam: Appears calm and comfortable Respiratory system: Clear to auscultation. Respiratory  effort normal. Cardiovascular system: S1 & S2 heard, RRR. No murmurs, rubs, gallops or clicks. Gastrointestinal system: Abdomen is nondistended, soft and nontender. No organomegaly or masses felt. Normal bowel sounds heard. Central nervous system: Alert and oriented. No focal neurological deficits. Extremities: No edema. No calf tenderness Skin: No cyanosis. No rashes Psychiatry: Judgement and insight appear normal. Mood & affect appropriate.     Data Reviewed: I have personally reviewed following labs and imaging studies  CBC: Recent Labs  Lab 09/08/17 1502  WBC 7.4  HGB 12.4  HCT 38.5  MCV 91.7  PLT 540*   Basic Metabolic Panel: Recent Labs  Lab 09/08/17 1502 09/09/17 0539  NA 135 135  K 3.2* 3.9  CL 105 107  CO2 22 21*  GLUCOSE 226* 212*  BUN 5* 8  CREATININE 0.80 0.73  CALCIUM 8.9 8.5*  MG  --  1.9   GFR: Estimated Creatinine Clearance: 108.2 mL/min (by C-G formula based on SCr of 0.73 mg/dL). Liver Function Tests: Recent Labs  Lab 09/09/17 0946  AST 43*  ALT 48  ALKPHOS 106  BILITOT 0.7  PROT 6.3*  ALBUMIN 3.2*   Recent Labs  Lab 09/09/17 0946  LIPASE 21   No results for input(s): AMMONIA in the last 168 hours. Coagulation Profile: No results for input(s): INR, PROTIME in the last 168 hours. Cardiac Enzymes: Recent Labs  Lab 09/08/17 2237 09/09/17 0539 09/09/17 0946  TROPONINI <0.03 <0.03 <0.03   BNP (last 3 results) No results for input(s): PROBNP in the  last 8760 hours. HbA1C: Recent Labs    09/11/17 1035  HGBA1C 7.2*   CBG: Recent Labs  Lab 09/10/17 1642 09/10/17 2113 09/11/17 0727 09/11/17 1126 09/11/17 1559  GLUCAP 93 196* 125* 89 142*   Lipid Profile: Recent Labs    09/09/17 0539  CHOL 99  HDL 31*  LDLCALC 47  TRIG 105  CHOLHDL 3.2   Thyroid Function Tests: No results for input(s): TSH, T4TOTAL, FREET4, T3FREE, THYROIDAB in the last 72 hours. Anemia Panel: No results for input(s): VITAMINB12, FOLATE, FERRITIN,  TIBC, IRON, RETICCTPCT in the last 72 hours. Sepsis Labs: No results for input(s): PROCALCITON, LATICACIDVEN in the last 168 hours.  No results found for this or any previous visit (from the past 240 hour(s)).       Radiology Studies: Nm Myocar Multi W/spect W/wall Motion / Ef  Addendum Date: 09/10/2017   ADDENDUM REPORT: 09/10/2017 13:53 ADDENDUM: Please correct the first paragraph in the Findings section to read as follows: Perfusion: Small area of mildly decreased activity in the proximal anterior wall on the post Lexiscan images with reversibility on the resting images. No abnormal activity elsewhere. Electronically Signed   By: Evangeline Dakin M.D.   On: 09/10/2017 13:53   Result Date: 09/10/2017 CLINICAL DATA:  41 year old presenting with chest pain. Risk factors for coronary artery disease include hypertension, diabetes and obesity. EXAM: MYOCARDIAL IMAGING WITH SPECT (REST AND PHARMACOLOGIC-STRESS) GATED LEFT VENTRICULAR WALL MOTION STUDY LEFT VENTRICULAR EJECTION FRACTION TECHNIQUE: Standard myocardial SPECT imaging was performed after resting intravenous injection of 10 mCi Tc-23m tetrofosmin. Subsequently, intravenous infusion of Lexiscan was performed under the supervision of the Cardiology staff. At peak effect of the drug, 30 mCi Tc-55m tetrofosmin was injected intravenously and standard myocardial SPECT imaging was performed. Quantitative gated imaging was also performed to evaluate left ventricular wall motion, and estimate left ventricular ejection fraction. COMPARISON:  None. FINDINGS: Perfusion: Small area of mildly decreased activity in the proximal anterior wall on the resting images with reversibility on the post Lexiscan images. No abnormal activity elsewhere. Wall Motion: Normal left ventricular wall motion. No left ventricular dilation. Left Ventricular Ejection Fraction: 40% End diastolic volume 981 ml End systolic volume 57 ml IMPRESSION: 1. Small area of reversibility in  the proximal anterior wall which may indicate focal ischemia. 2. Normal left ventricular wall motion. 3. Left ventricular ejection fraction 51% 4. Non invasive risk stratification*: Low. *2012 Appropriate Use Criteria for Coronary Revascularization Focused Update: J Am Coll Cardiol. 1914;78(2):956-213. http://content.[email or web address]?[email or web address] Electronically Signed: By: Evangeline Dakin M.D. On: 09/10/2017 13:22        Scheduled Meds: . [START ON 09/12/2017] aspirin  81 mg Oral Pre-Cath  . aspirin EC  81 mg Oral Daily  . [START ON 09/12/2017] clopidogrel  300 mg Oral Once  . enoxaparin (LOVENOX) injection  40 mg Subcutaneous Daily  . famotidine  20 mg Oral BID  . irbesartan  150 mg Oral Daily   And  . hydrochlorothiazide  12.5 mg Oral Daily  . insulin aspart  0-5 Units Subcutaneous QHS  . insulin aspart  0-9 Units Subcutaneous TID WC  . metoprolol tartrate  25 mg Oral BID  . sodium chloride flush  3 mL Intravenous Q12H   Continuous Infusions: . sodium chloride    . sodium chloride       LOS: 1 day     Cordelia Poche, MD [facility] 09/11/2017, 4:39 PM Pager: [phone]  If 7PM-7AM, please contact night-coverage [email or web address] Password [email or web address] 09/11/2017, 4:39 PM

## 2017-09-11 NOTE — Plan of Care (Signed)
Plan Cardiac Cath 12/24. Verbalizes understanding.

## 2017-09-12 ENCOUNTER — Other Ambulatory Visit: Payer: Self-pay

## 2017-09-12 ENCOUNTER — Encounter (HOSPITAL_COMMUNITY): Payer: Self-pay | Admitting: Cardiovascular Disease

## 2017-09-12 ENCOUNTER — Encounter (HOSPITAL_COMMUNITY)
Admission: EM | Disposition: A | Discharge status: Home / Self Care | Payer: Self-pay | Source: Home / Self Care | Attending: Internal Medicine

## 2017-09-12 DIAGNOSIS — R0789 Other chest pain: Principal | ICD-10-CM

## 2017-09-12 HISTORY — PX: LEFT HEART CATH AND CORONARY ANGIOGRAPHY: CATH118249

## 2017-09-12 LAB — BASIC METABOLIC PANEL
ANION GAP: 7 (ref 5–15)
BUN: 9 mg/dL (ref 6–20)
CALCIUM: 8.6 mg/dL — AB (ref 8.9–10.3)
CO2: 26 mmol/L (ref 22–32)
Chloride: 105 mmol/L (ref 101–111)
Creatinine, Ser: 0.68 mg/dL (ref 0.44–1.00)
Glucose, Bld: 122 mg/dL — ABNORMAL HIGH (ref 65–99)
POTASSIUM: 3.4 mmol/L — AB (ref 3.5–5.1)
Sodium: 138 mmol/L (ref 135–145)

## 2017-09-12 LAB — HCG, SERUM, QUALITATIVE: PREG SERUM: NEGATIVE

## 2017-09-12 LAB — PROTIME-INR
INR: 0.95
Prothrombin Time: 12.6 seconds (ref 11.4–15.2)

## 2017-09-12 LAB — GLUCOSE, CAPILLARY
Glucose-Capillary: 124 mg/dL — ABNORMAL HIGH (ref 65–99)
Glucose-Capillary: 134 mg/dL — ABNORMAL HIGH (ref 65–99)

## 2017-09-12 SURGERY — LEFT HEART CATH AND CORONARY ANGIOGRAPHY
Anesthesia: LOCAL

## 2017-09-12 MED ORDER — SODIUM CHLORIDE 0.9% FLUSH: 3.0000 mL | INTRAVENOUS | Status: DC | PRN

## 2017-09-12 MED ORDER — LIDOCAINE HCL (PF) 1 % IJ SOLN
INTRAMUSCULAR | Status: AC
  Filled 2017-09-12: qty 30

## 2017-09-12 MED ORDER — POTASSIUM CHLORIDE CRYS ER 20 MEQ PO TBCR: 40.0000 meq | EXTENDED_RELEASE_TABLET | Freq: Once | ORAL | Status: DC

## 2017-09-12 MED ORDER — HEPARIN (PORCINE) IN NACL 2-0.9 UNIT/ML-% IJ SOLN
INTRAMUSCULAR | Status: AC
  Filled 2017-09-12: qty 1000

## 2017-09-12 MED ORDER — SODIUM CHLORIDE 0.9 % IV SOLN: 250.0000 mL | INTRAVENOUS | Status: DC | PRN

## 2017-09-12 MED ORDER — SODIUM CHLORIDE 0.9% FLUSH: 3.0000 mL | Freq: Two times a day (BID) | INTRAVENOUS | Status: DC

## 2017-09-12 MED ORDER — FENTANYL CITRATE (PF) 100 MCG/2ML IJ SOLN
INTRAMUSCULAR | Status: AC
  Filled 2017-09-12: qty 2

## 2017-09-12 MED ORDER — LIDOCAINE HCL (PF) 1 % IJ SOLN
INTRAMUSCULAR | Status: DC | PRN
  Administered 2017-09-12: 15 mL

## 2017-09-12 MED ORDER — FENTANYL CITRATE (PF) 100 MCG/2ML IJ SOLN
INTRAMUSCULAR | Status: DC | PRN
  Administered 2017-09-12: 25 ug via INTRAVENOUS

## 2017-09-12 MED ORDER — IOPAMIDOL (ISOVUE-370) INJECTION 76%
INTRAVENOUS | Status: AC
  Filled 2017-09-12: qty 100

## 2017-09-12 MED ORDER — IOPAMIDOL (ISOVUE-370) INJECTION 76%
INTRAVENOUS | Status: DC | PRN
  Administered 2017-09-12: 60 mL

## 2017-09-12 MED ORDER — HEPARIN (PORCINE) IN NACL 2-0.9 UNIT/ML-% IJ SOLN
INTRAMUSCULAR | Status: DC | PRN
  Administered 2017-09-12: 08:00:00

## 2017-09-12 MED ORDER — SODIUM CHLORIDE 0.9 % IV SOLN: INTRAVENOUS | Status: AC

## 2017-09-12 MED ORDER — HYDRALAZINE HCL 20 MG/ML IJ SOLN
INTRAMUSCULAR | Status: DC | PRN
  Administered 2017-09-12: 10 mg via INTRAVENOUS

## 2017-09-12 MED ORDER — MIDAZOLAM HCL 2 MG/2ML IJ SOLN
INTRAMUSCULAR | Status: DC | PRN
  Administered 2017-09-12: 1 mg via INTRAVENOUS

## 2017-09-12 MED ORDER — HYDRALAZINE HCL 20 MG/ML IJ SOLN
INTRAMUSCULAR | Status: AC
  Filled 2017-09-12: qty 1

## 2017-09-12 MED ORDER — METFORMIN HCL ER 500 MG PO TB24
1000.0000 mg | ORAL_TABLET | Freq: Every day | ORAL | Status: DC
Start: 2017-09-14 — End: 2020-03-10

## 2017-09-12 MED ORDER — MIDAZOLAM HCL 2 MG/2ML IJ SOLN
INTRAMUSCULAR | Status: AC
  Filled 2017-09-12: qty 2

## 2017-09-12 SURGICAL SUPPLY — 7 items
CATH INFINITI 5FR MULTPACK ANG (CATHETERS) ×2 IMPLANT
KIT HEART LEFT (KITS) ×2 IMPLANT
PACK CARDIAC CATHETERIZATION (CUSTOM PROCEDURE TRAY) ×2 IMPLANT
SHEATH PINNACLE 5F 10CM (SHEATH) ×2 IMPLANT
SYR MEDRAD MARK V 150ML (SYRINGE) ×2 IMPLANT
TRANSDUCER W/STOPCOCK (MISCELLANEOUS) ×2 IMPLANT
WIRE EMERALD 3MM-J .035X150CM (WIRE) ×2 IMPLANT

## 2017-09-12 NOTE — Discharge Summary (Signed)
Physician Discharge Summary  [name] HBZ:[id number] DOB: 1976-04-17 DOA: 09/08/2017  PCP: Glendale Chard, MD  Admit date: 09/08/2017 Discharge date: 09/12/2017  Admitted From: Home Disposition: Home  Recommendations for Outpatient Follow-up:  1. Follow up with PCP in 1 week 2. Follow up with Dr. Doylene Canard in 1 week 3. Please obtain BMP/CBC in one week 4. Please follow up on the following pending results: None  Home Health: None Equipment/Devices: None  Discharge Condition: Stable CODE STATUS: Full code Diet recommendation: Heart healthy   Brief/Interim Summary:  Admission HPI written by Vianne Bulls, MD   Chief Complaint: Chest discomfort, N/V  HPI: Gabrielle Perkins is a 41 y.o. female with medical history significant for hypertension and type 2 diabetes mellitus, now presenting to the emergency department for evaluation of chest discomfort.  She reports that she woke this morning with a "tightness" in her central chest symptoms were mild she suspected that it was secondary to indigestion.  The discomfort eventually eased off, but recurred several times throughout the day while she was at work.  There did not seem to be any worsening with exertion and no alleviating or exacerbating factors were identified.  She was perhaps mildly short of breath during the episodes and there was nausea with episode of vomiting associated with this.  She had never experienced these symptoms previously.  Went to bed last night in her usual state and was, or chills.  She reports that her maternal grandmother coronary artery disease with a first MI in her early 43s.  ED Course: Upon arrival to the ED, patient is found to be afebrile, saturating well on room air, and.  EKG features a normal sinus rhythm with nonspecific T wave abnormality in diffuse leads.  Chest x-ray is negative for acute cardiopulmonary disease and troponin is undetectable.  Mr. panel is notable for a potassium of 3.2 and CBC  features a slight thrombocytosis with platelets 148,000.  Patient was treated with 324 mg of aspirin, sublingual nitroglycerin, and 40 mEq oral potassium.  She reported resolution of her pain after the sublingual nitroglycerin, pain later recurred in the emergency department and an inch of nitroglycerin ointment was applied.  Patient remains hemodynamically stable and in no apparent respiratory distress.  She will be observed on the telemetry unit for ongoing evaluation and management of atypical chest pain with risk factors for CAD and EKG abnormalities with no prior available for comparison.      Hospital course:  Chest pain Patient underwent nuclear stress test which was abnormal. No chest pain recurrent chest pain. Cath performed on 12/24 and was normal per discussion with Dr. Doylene Canard. He recommends outpatient follow-up with him in one week.  Essential hypertension Continue irbesartan, hydrochlorothiazide, and metoprolol  Diabetes mellitus, type 2 Patient is metformin as an outpatient. Hemoglobin A1C of 7.2. Sliding scale insulin while inpatient. Hold metformin for 48 hours post-cath on discharge.  Morbid obesity Body mass index is 38.35 kg/m.  Discharge Diagnoses:  Principal Problem:   Chest pain Active Problems:   Hypertension   Diabetes mellitus without complication ([place])   Hypokalemia    Discharge Instructions  Discharge Instructions    Diet - low sodium heart healthy   Complete by:  As directed    Increase activity slowly   Complete by:  As directed      Allergies as of 09/12/2017   No Known Allergies     Medication List    TAKE these medications   acetaminophen 325  MG tablet Commonly known as:  TYLENOL Take 325-650 mg by mouth every 6 (six) hours as needed (for headaches).   amLODipine 5 MG tablet Commonly known as:  NORVASC Take 5 mg by mouth daily.   aspirin EC 81 MG tablet Take 81 mg by mouth daily.   cetirizine 10 MG tablet Commonly known  as:  ZYRTEC Take 10 mg by mouth daily.   metFORMIN 500 MG 24 hr tablet Commonly known as:  GLUCOPHAGE-XR Take 2 tablets (1,000 mg total) by mouth daily with supper. Start taking on:  09/14/2017 What changed:  These instructions start on 09/14/2017. If you are unsure what to do until then, ask your doctor or other care provider.   MICARDIS HCT 40-12.5 MG tablet Generic drug:  telmisartan-hydrochlorothiazide Take 1 tablet by mouth daily.   ranitidine 150 MG capsule Commonly known as:  ZANTAC Take 150 mg by mouth daily.   TOPROL XL 50 MG 24 hr tablet Generic drug:  metoprolol succinate Take 50 mg by mouth daily.      Follow-up Information    Dixie Dials, MD. Schedule an appointment as soon as possible for a visit in 1 week(s).   Specialty:  Cardiology Contact information: [place] [address] [phone]        Glendale Chard, MD. Schedule an appointment as soon as possible for a visit in 1 week(s).   Specialty:  Internal Medicine Contact information: [address] [place] [address] [phone]          No Known Allergies  Consultations:  Cardiology   Procedures/Studies: Dg Chest 2 View  Result Date: 09/08/2017 CLINICAL DATA:  Chest tightness and right-sided tingling today. History of heart murmur, hypertension, pre diabetes. EXAM: CHEST  2 VIEW COMPARISON:  None in PACs FINDINGS: The lungs are adequately inflated and clear. The heart and pulmonary vascularity are normal. The mediastinum is normal in width. There is no pleural effusion. The bony thorax exhibits no acute abnormality. IMPRESSION: There is no active cardiopulmonary disease. Electronically Signed   By: David  Martinique M.D.   On: 09/08/2017 15:25   Nm Myocar Multi W/spect W/wall Motion / Ef  Addendum Date: 09/10/2017   ADDENDUM REPORT: 09/10/2017 13:53 ADDENDUM: Please correct the first paragraph in the Findings section to read as follows: Perfusion: Small  area of mildly decreased activity in the proximal anterior wall on the post Lexiscan images with reversibility on the resting images. No abnormal activity elsewhere. Electronically Signed   By: Evangeline Dakin M.D.   On: 09/10/2017 13:53   Result Date: 09/10/2017 CLINICAL DATA:  41 year old presenting with chest pain. Risk factors for coronary artery disease include hypertension, diabetes and obesity. EXAM: MYOCARDIAL IMAGING WITH SPECT (REST AND PHARMACOLOGIC-STRESS) GATED LEFT VENTRICULAR WALL MOTION STUDY LEFT VENTRICULAR EJECTION FRACTION TECHNIQUE: Standard myocardial SPECT imaging was performed after resting intravenous injection of 10 mCi Tc-24m tetrofosmin. Subsequently, intravenous infusion of Lexiscan was performed under the supervision of the Cardiology staff. At peak effect of the drug, 30 mCi Tc-20m tetrofosmin was injected intravenously and standard myocardial SPECT imaging was performed. Quantitative gated imaging was also performed to evaluate left ventricular wall motion, and estimate left ventricular ejection fraction. COMPARISON:  None. FINDINGS: Perfusion: Small area of mildly decreased activity in the proximal anterior wall on the resting images with reversibility on the post Lexiscan images. No abnormal activity elsewhere. Wall Motion: Normal left ventricular wall motion. No left ventricular dilation. Left Ventricular Ejection Fraction: 74% End diastolic volume 128  ml End systolic volume 57 ml IMPRESSION: 1. Small area of reversibility in the proximal anterior wall which may indicate focal ischemia. 2. Normal left ventricular wall motion. 3. Left ventricular ejection fraction 51% 4. Non invasive risk stratification*: Low. *2012 Appropriate Use Criteria for Coronary Revascularization Focused Update: J Am Coll Cardiol. 5284;13(2):440-102. http://[email or web address] Electronically Signed: By: Evangeline Dakin M.D. On: 09/10/2017 13:22     Echocardiogram  (09/09/2017) Study Conclusions  - Left ventricle: The cavity size was normal. Wall thickness was   normal. Systolic function was normal. The estimated ejection   fraction was in the range of 60% to 65%. Wall motion was normal;   there were no regional wall motion abnormalities. Left   ventricular diastolic function parameters were normal. - Mitral valve: There was mild regurgitation. - Left atrium: The atrium was mildly dilated. - Tricuspid valve: There was moderate regurgitation.   Subjective: No chest pain or dyspnea.  Discharge Exam: Vitals:   09/12/17 1200 09/12/17 1225  BP:  128/90  Pulse:    Resp: 16 14  Temp:    SpO2:     Vitals:   09/12/17 1100 09/12/17 1125 09/12/17 1200 09/12/17 1225  BP:  (!) 140/92  128/90  Pulse:      Resp: 16 20 16 14   Temp:      TempSrc:      SpO2:      Weight:      Height:        General: Pt is alert, awake, not in acute distress Cardiovascular: RRR, S1/S2 +, no rubs, no gallops Respiratory: CTA bilaterally, no wheezing, no rhonchi Abdominal: Soft, NT, ND, bowel sounds + Extremities: no edema, no cyanosis    The results of significant diagnostics from this hospitalization (including imaging, microbiology, ancillary and laboratory) are listed below for reference.     Microbiology: No results found for this or any previous visit (from the past 240 hour(s)).   Labs: BNP (last 3 results) Recent Labs    09/08/17 1502  BNP 72.5   Basic Metabolic Panel: Recent Labs  Lab 09/08/17 1502 09/09/17 0539 09/12/17 0511  NA 135 135 138  K 3.2* 3.9 3.4*  CL 105 107 105  CO2 22 21* 26  GLUCOSE 226* 212* 122*  BUN 5* 8 9  CREATININE 0.80 0.73 0.68  CALCIUM 8.9 8.5* 8.6*  MG  --  1.9  --    Liver Function Tests: Recent Labs  Lab 09/09/17 0946  AST 43*  ALT 48  ALKPHOS 106  BILITOT 0.7  PROT 6.3*  ALBUMIN 3.2*   Recent Labs  Lab 09/09/17 0946  LIPASE 21   No results for input(s): AMMONIA in the last 168  hours. CBC: Recent Labs  Lab 09/08/17 1502  WBC 7.4  HGB 12.4  HCT 38.5  MCV 91.7  PLT 448*   Cardiac Enzymes: Recent Labs  Lab 09/08/17 2237 09/09/17 0539 09/09/17 0946  TROPONINI <0.03 <0.03 <0.03   BNP: Invalid input(s): POCBNP CBG: Recent Labs  Lab 09/11/17 1126 09/11/17 1559 09/11/17 2128 09/12/17 0834 09/12/17 1126  GLUCAP 89 142* 120* 124* 134*   D-Dimer No results for input(s): DDIMER in the last 72 hours. Hgb A1c Recent Labs    09/11/17 1035  HGBA1C 7.2*   Lipid Profile No results for input(s): CHOL, HDL, LDLCALC, TRIG, CHOLHDL, LDLDIRECT in the last 72 hours. Thyroid function studies No results for input(s): TSH, T4TOTAL, T3FREE, THYROIDAB in the last 72 hours.  Invalid input(s): FREET3 Anemia work  up No results for input(s): VITAMINB12, FOLATE, FERRITIN, TIBC, IRON, RETICCTPCT in the last 72 hours. Urinalysis No results found for: COLORURINE, APPEARANCEUR, LABSPEC, [place], GLUCOSEU, HGBUR, BILIRUBINUR, KETONESUR, PROTEINUR, UROBILINOGEN, NITRITE, LEUKOCYTESUR Sepsis Labs Invalid input(s): PROCALCITONIN,  WBC,  LACTICIDVEN Microbiology No results found for this or any previous visit (from the past 240 hour(s)).   Time coordinating discharge: Over 30 minutes  SIGNED:   Cordelia Poche, MD [facility] 09/12/2017, 1:20 PM Pager [phone]  If 7PM-7AM, please contact night-coverage [email or web address] Password [email or web address]

## 2017-09-12 NOTE — Interval H&P Note (Signed)
History and Physical Interval Note:  09/12/2017 7:39 AM  Gabrielle Perkins  has presented today for surgery, with the diagnosis of unstable angina  The various methods of treatment have been discussed with the patient and family. After consideration of risks, benefits and other options for treatment, the patient has consented to  Procedure(s): LEFT HEART CATH AND CORONARY ANGIOGRAPHY (N/A) as a surgical intervention .  The patient's history has been reviewed, patient examined, no change in status, stable for surgery.  I have reviewed the patient's chart and labs.  Questions were answered to the patient's satisfaction.     Birdie Riddle

## 2017-09-12 NOTE — Discharge Instructions (Signed)
Nonspecific Chest Pain °Chest pain can be caused by many different conditions. There is always a chance that your pain could be related to something serious, such as a heart attack or a blood clot in your lungs. Chest pain can also be caused by conditions that are not life-threatening. If you have chest pain, it is very important to follow up with your health care provider. °What are the causes? °Causes of this condition include: °· Heartburn. °· Pneumonia or bronchitis. °· Anxiety or stress. °· Inflammation around your heart (pericarditis) or lung (pleuritis or pleurisy). °· A blood clot in your lung. °· A collapsed lung (pneumothorax). This can develop suddenly on its own (spontaneous pneumothorax) or from trauma to the chest. °· Shingles infection (varicella-zoster virus). °· Heart attack. °· Damage to the bones, muscles, and cartilage that make up your chest wall. This can include: °? Bruised bones due to injury. °? Strained muscles or cartilage due to frequent or repeated coughing or overwork. °? Fracture to one or more ribs. °? Sore cartilage due to inflammation (costochondritis). ° °What increases the risk? °Risk factors for this condition may include: °· Activities that increase your risk for trauma or injury to your chest. °· Respiratory infections or conditions that cause frequent coughing. °· Medical conditions or overeating that can cause heartburn. °· Heart disease or family history of heart disease. °· Conditions or health behaviors that increase your risk of developing a blood clot. °· Having had chicken pox (varicella zoster). ° °What are the signs or symptoms? °Chest pain can feel like: °· Burning or tingling on the surface of your chest or deep in your chest. °· Crushing, pressure, aching, or squeezing pain. °· Dull or sharp pain that is worse when you move, cough, or take a deep breath. °· Pain that is also felt in your back, neck, shoulder, or arm, or pain that spreads to any of these  areas. ° °Your chest pain may come and go, or it may stay constant. °How is this diagnosed? °Lab tests or other studies may be needed to find the cause of your pain. Your health care provider may have you take a test called an ECG (electrocardiogram). An ECG records your heartbeat patterns at the time the test is performed. You may also have other tests, such as: °· Transthoracic echocardiogram (TTE). In this test, sound waves are used to create a picture of the heart structures and to look at how blood flows through your heart. °· Transesophageal echocardiogram (TEE). This is a more advanced imaging test that takes images from inside your body. It allows your health care provider to see your heart in finer detail. °· Cardiac monitoring. This allows your health care provider to monitor your heart rate and rhythm in real time. °· Holter monitor. This is a portable device that records your heartbeat and can help to diagnose abnormal heartbeats. It allows your health care provider to track your heart activity for several days, if needed. °· Stress tests. These can be done through exercise or by taking medicine that makes your heart beat more quickly. °· Blood tests. °· Other imaging tests. ° °How is this treated? °Treatment depends on what is causing your chest pain. Treatment may include: °· Medicines. These may include: °? Acid blockers for heartburn. °? Anti-inflammatory medicine. °? Pain medicine for inflammatory conditions. °? Antibiotic medicine, if an infection is present. °? Medicines to dissolve blood clots. °? Medicines to treat coronary artery disease (CAD). °· Supportive care for conditions that   do not require medicines. This may include: °? Resting. °? Applying heat or cold packs to injured areas. °? Limiting activities until pain decreases. ° °Follow these instructions at home: °Medicines °· If you were prescribed an antibiotic, take it as told by your health care provider. Do not stop taking the  antibiotic even if you start to feel better. °· Take over-the-counter and prescription medicines only as told by your health care provider. °Lifestyle °· Do not use any products that contain nicotine or tobacco, such as cigarettes and e-cigarettes. If you need help quitting, ask your health care provider. °· Do not drink alcohol. °· Make lifestyle changes as directed by your health care provider. These may include: °? Getting regular exercise. Ask your health care provider to suggest some activities that are safe for you. °? Eating a heart-healthy diet. A registered dietitian can help you to learn healthy eating options. °? Maintaining a healthy weight. °? Managing diabetes, if necessary. °? Reducing stress, such as with yoga or relaxation techniques. °General instructions °· Avoid any activities that bring on chest pain. °· If heartburn is the cause for your chest pain, raise (elevate) the head of your bed about 6 inches (15 cm) by putting blocks under the legs. Sleeping with more pillows does not effectively relieve heartburn because it only changes the position of your head. °· Keep all follow-up visits as told by your health care provider. This is important. This includes any further testing if your chest pain does not go away. °Contact a health care provider if: °· Your chest pain does not go away. °· You have a rash with blisters on your chest. °· You have a fever. °· You have chills. °Get help right away if: °· Your chest pain is worse. °· You have a cough that gets worse, or you cough up blood. °· You have severe pain in your abdomen. °· You have severe weakness. °· You faint. °· You have sudden, unexplained chest discomfort. °· You have sudden, unexplained discomfort in your arms, back, neck, or jaw. °· You have shortness of breath at any time. °· You suddenly start to sweat, or your skin gets clammy. °· You feel nauseous or you vomit. °· You suddenly feel light-headed or dizzy. °· Your heart begins to beat  quickly, or it feels like it is skipping beats. °These symptoms may represent a serious problem that is an emergency. Do not wait to see if the symptoms will go away. Get medical help right away. Call your local emergency services (911 in the [place] Do not drive yourself to the hospital. °This information is not intended to replace advice given to you by your health care provider. Make sure you discuss any questions you have with your health care provider. °Document Released: 06/16/2005 Document Revised: 05/31/2016 Document Reviewed: 05/31/2016 °Elsevier Interactive Patient Education © 2017 [employer] ° °

## 2017-09-12 NOTE — Progress Notes (Addendum)
Site area: RFA Site Prior to Removal:  Level 0 Pressure Applied For:25 min Manual: yes   Patient Status During Pull:  stable Post Pull Site:  Level 0 Post Pull Instructions Given:  yes Post Pull Pulses Present: palpable Dressing Applied: tegaderm  Bedrest begins @ 0900 till 1300 Comments:

## 2017-09-12 NOTE — Progress Notes (Signed)
DC'd pt at this time.  Pt accompanied by husband and father.  Denies pain.  No s/s of any acute distress.

## 2017-09-12 NOTE — Plan of Care (Signed)
Reviewed instructions regarding her heart cath and answered questions, VSS and pt is visiting with family, will continue to monitor.

## 2017-09-12 NOTE — Progress Notes (Signed)
Dc instructions given to patient at this time.  Verbalized understanding of all instructions.  Pt ambulated entire unit at 1345.  Will dc at 1445 per order.

## 2018-07-10 ENCOUNTER — Encounter: Payer: Self-pay | Admitting: Internal Medicine

## 2018-07-10 DIAGNOSIS — K146 Glossodynia: Secondary | ICD-10-CM | POA: Insufficient documentation

## 2018-07-13 ENCOUNTER — Encounter: Payer: Self-pay | Admitting: Internal Medicine

## 2018-07-13 ENCOUNTER — Other Ambulatory Visit: Payer: Self-pay

## 2018-07-13 ENCOUNTER — Ambulatory Visit (INDEPENDENT_AMBULATORY_CARE_PROVIDER_SITE_OTHER): Admitting: Internal Medicine

## 2018-07-13 VITALS — BP 114/82 | HR 83 | Temp 98.7°F | Ht 64.0 in | Wt 218.4 lb

## 2018-07-13 DIAGNOSIS — E1165 Type 2 diabetes mellitus with hyperglycemia: Secondary | ICD-10-CM | POA: Diagnosis not present

## 2018-07-13 DIAGNOSIS — Z23 Encounter for immunization: Secondary | ICD-10-CM | POA: Diagnosis not present

## 2018-07-13 DIAGNOSIS — Z6837 Body mass index (BMI) 37.0-37.9, adult: Secondary | ICD-10-CM

## 2018-07-13 DIAGNOSIS — I10 Essential (primary) hypertension: Secondary | ICD-10-CM | POA: Diagnosis not present

## 2018-07-13 MED ORDER — SEMAGLUTIDE(0.25 OR 0.5MG/DOS) 2 MG/1.5ML ~~LOC~~ SOPN: 0.5000 mg | PEN_INJECTOR | SUBCUTANEOUS | 2 refills | Status: DC

## 2018-07-13 NOTE — Progress Notes (Signed)
Subjective:     Patient ID: Gabrielle Perkins , female    DOB: Feb 26, 1976 , 42 y.o.   MRN: [id number]   Chief Complaint  Patient presents with  . Diabetes  . Hypertension    HPI  Diabetes  She presents for her follow-up diabetic visit. She has type 2 diabetes mellitus. There are no hypoglycemic associated symptoms. There are no diabetic associated symptoms. There are no hypoglycemic complications. Risk factors for coronary artery disease include diabetes mellitus, hypertension, obesity, sedentary lifestyle and dyslipidemia. She is following a diabetic diet. She participates in exercise intermittently.  Hypertension  This is a chronic problem. The current episode started more than 1 year ago. The problem is controlled.   SHE REPORTS COMPLIANCE WITH MEDS.   Past Medical History:  Diagnosis Date  . Diabetes mellitus without complication ([place])   . Hypertension       Current Outpatient Medications:  .  acetaminophen (TYLENOL) 325 MG tablet, Take 325-650 mg by mouth every 6 (six) hours as needed (for headaches)., Disp: , Rfl:  .  amLODipine (NORVASC) 5 MG tablet, Take 5 mg by mouth daily., Disp: , Rfl:  .  aspirin EC 81 MG tablet, Take 81 mg by mouth daily., Disp: , Rfl:  .  cetirizine (ZYRTEC) 10 MG tablet, Take 10 mg by mouth daily., Disp: , Rfl:  .  metFORMIN (GLUCOPHAGE-XR) 500 MG 24 hr tablet, Take 2 tablets (1,000 mg total) by mouth daily with supper., Disp: , Rfl:  .  metoprolol succinate (TOPROL XL) 50 MG 24 hr tablet, Take 50 mg by mouth daily., Disp: , Rfl:  .  ranitidine (ZANTAC) 150 MG capsule, Take 150 mg by mouth daily., Disp: , Rfl:  .  Semaglutide,0.25 or 0.5MG/DOS, (OZEMPIC, 0.25 OR 0.5 MG/DOSE,) 2 MG/1.5ML SOPN, Inject 0.5 mg into the skin once a week., Disp: 12 pen, Rfl: 2 .  telmisartan-hydrochlorothiazide (MICARDIS HCT) 80-25 MG tablet, Take 1 tablet by mouth daily., Disp: , Rfl:    No Known Allergies   Review of Systems  Constitutional: Negative.   HENT:  Negative.   Eyes: Negative.   Respiratory: Negative.   Cardiovascular: Negative.   Neurological: Negative.   Psychiatric/Behavioral: Negative.      Today's Vitals   07/13/18 1616  BP: 114/82  Pulse: 83  Temp: 98.7 F (37.1 C)  TempSrc: Oral  Weight: 218 lb 6.4 oz (99.1 kg)  Height: '5\' 4"'  (1.626 m)   Body mass index is 37.49 kg/m.   Objective:  Physical Exam  Constitutional: She appears well-developed and well-nourished.  Eyes: EOM are normal.  Neck: Normal range of motion.  Cardiovascular: Normal rate, regular rhythm and normal heart sounds.  Pulmonary/Chest: Effort normal and breath sounds normal.  Skin: Skin is warm and dry.  Psychiatric: She has a normal mood and affect.  Nursing note and vitals reviewed.       Assessment And Plan:     1. Uncontrolled type 2 diabetes mellitus with hyperglycemia (HCC)  I WILL CHECK A CMET, HBA1C TODAY. SHE IS ENCOURAGED TO EXERCISE NO LESS THAN FIVE DAYS WEEKLY FOR AT LEAST 30 MINUTES. I WILL CONSIDER INCREASING HER DOSE OF OZEMPIC TO HELP HER ACHIEVE OPTIMAL GLYCEMIC CONTROL WHILE ALSO HELPING HER TO ACHIEVE A HEALTHIER BMI.   - CMP14+EGFR - Hemoglobin A1c  2. Essential hypertension, benign  WELL CONTROLLED. SHE WILL CONTINUE WITH CURRENT MEDS. SHE IS ENCOURAGED TO LIMIT HER SALT INTAKE.   3. Class 2 severe obesity due to excess calories  with serious comorbidity and body mass index (BMI) of 37.0 to 37.9 in adult (HCC)  SHE HAS BMI GREATER THAN 35 ALONG WITH TWO OR MORE COMORBIDITIES ASSOCIATED WITH OBESITY.  SHE IS ENCOURAGED TO STRIVE FOR BMI LESS THAN 32 TO DECREASE CARDIAC RISK.   4. Needs flu shot  SHE WAS GIVEN FLU VACCINE.   [name], MD

## 2018-07-13 NOTE — Patient Instructions (Signed)

## 2018-07-14 LAB — CMP14+EGFR
ALBUMIN: 4.3 g/dL (ref 3.5–5.5)
ALT: 20 IU/L (ref 0–32)
AST: 20 IU/L (ref 0–40)
Albumin/Globulin Ratio: 1.9 (ref 1.2–2.2)
Alkaline Phosphatase: 104 IU/L (ref 39–117)
BILIRUBIN TOTAL: 0.2 mg/dL (ref 0.0–1.2)
BUN / CREAT RATIO: 7 — AB (ref 9–23)
BUN: 6 mg/dL (ref 6–24)
CALCIUM: 9.5 mg/dL (ref 8.7–10.2)
CHLORIDE: 99 mmol/L (ref 96–106)
CO2: 25 mmol/L (ref 20–29)
CREATININE: 0.86 mg/dL (ref 0.57–1.00)
GFR, EST AFRICAN AMERICAN: 97 mL/min/{1.73_m2} (ref >59)
GFR, EST NON AFRICAN AMERICAN: 84 mL/min/{1.73_m2} (ref >59)
GLUCOSE: 117 mg/dL — AB (ref 65–99)
Globulin, Total: 2.3 g/dL (ref 1.5–4.5)
Potassium: 3.6 mmol/L (ref 3.5–5.2)
Sodium: 138 mmol/L (ref 134–144)
TOTAL PROTEIN: 6.6 g/dL (ref 6.0–8.5)

## 2018-07-14 LAB — HEMOGLOBIN A1C
Est. average glucose Bld gHb Est-mCnc: 134 mg/dL
Hgb A1c MFr Bld: 6.3 % — ABNORMAL HIGH (ref 4.8–5.6)

## 2018-07-16 ENCOUNTER — Encounter: Payer: Self-pay | Admitting: Internal Medicine

## 2018-07-17 DIAGNOSIS — Z23 Encounter for immunization: Secondary | ICD-10-CM | POA: Diagnosis not present

## 2018-07-17 NOTE — Progress Notes (Signed)
Your kidney and liver function are normal. Your hba1c is 6.3, this is great.

## 2018-07-21 ENCOUNTER — Telehealth: Payer: Self-pay

## 2018-07-21 NOTE — Telephone Encounter (Signed)
Left the pt a message to call back for her lab results. 

## 2018-07-21 NOTE — Telephone Encounter (Signed)
-----   Message from Glendale Chard, MD sent at 07/17/2018 12:15 AM EDT ----- Your kidney and liver function are normal. Your hba1c is 6.3, this is great.

## 2018-09-29 ENCOUNTER — Encounter (HOSPITAL_COMMUNITY): Payer: Self-pay

## 2018-09-29 ENCOUNTER — Ambulatory Visit (HOSPITAL_COMMUNITY)
Admission: EM | Admit: 2018-09-29 | Discharge: 2018-09-29 | Disposition: A | Discharge status: Home / Self Care | Attending: Family Medicine | Admitting: Family Medicine

## 2018-09-29 DIAGNOSIS — J069 Acute upper respiratory infection, unspecified: Secondary | ICD-10-CM | POA: Insufficient documentation

## 2018-09-29 DIAGNOSIS — B9789 Other viral agents as the cause of diseases classified elsewhere: Secondary | ICD-10-CM | POA: Diagnosis not present

## 2018-09-29 MED ORDER — PREDNISONE 50 MG PO TABS: 50.0000 mg | ORAL_TABLET | Freq: Every day | ORAL | 0 refills | Status: DC

## 2018-09-29 MED ORDER — FLUTICASONE PROPIONATE 50 MCG/ACT NA SUSP: 2.0000 | Freq: Every day | NASAL | 0 refills | Status: DC

## 2018-09-29 MED ORDER — IPRATROPIUM BROMIDE 0.06 % NA SOLN: 2.0000 | Freq: Four times a day (QID) | NASAL | 0 refills | Status: DC

## 2018-09-29 MED ORDER — DOXYCYCLINE HYCLATE 100 MG PO CAPS: 100.0000 mg | ORAL_CAPSULE | Freq: Two times a day (BID) | ORAL | 0 refills | Status: DC

## 2018-09-29 NOTE — Discharge Instructions (Signed)
Prednisone as directed. Start flonase, atrovent nasal spray for nasal congestion/drainage. You can use over the counter nasal saline rinse such as neti pot for nasal congestion. Keep hydrated, your urine should be clear to pale yellow in color. Tylenol/motrin for fever and pain. Monitor for any worsening of symptoms, chest pain, shortness of breath, wheezing, swelling of the throat, follow up for reevaluation.   If symptoms not improving, can fill doxycycline on 1/15 to cover for bacterial infection.  For sore throat/cough try using a honey-based tea. Use 3 teaspoons of honey with juice squeezed from half lemon. Place shaved pieces of ginger into 1/2-1 cup of water and warm over stove top. Then mix the ingredients and repeat every 4 hours as needed.

## 2018-09-29 NOTE — ED Triage Notes (Signed)
Pt presents with persistent cough X 2 weeks with yellow mucus, chills, sweats and head congestion.

## 2018-09-29 NOTE — ED Provider Notes (Signed)
[facility]    CSN: [id number] Arrival date & time: 09/29/18  5465     History   Chief Complaint Chief Complaint  Patient presents with  . URI    HPI Gabrielle Perkins is a 43 y.o. female.   43 year old female with history of DM, HTN comes in for 6 day history of URI symptoms. Has had rhinorrhea, nasal congestion, cough. States cough is mostly nonproductive, but can be productive at times, this is slowly improving. Has had chills and night sweats without known fever. Had chest soreness with cough that has resolved. Denies chest pain, shortness of breath, wheezing, hemoptysis. Never smoker. otc cold medicine without relief. Last a1c 6.3     Past Medical History:  Diagnosis Date  . Diabetes mellitus without complication ([place])   . Hypertension     Patient Active Problem List   Diagnosis Date Noted  . 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 Hypertension 09/08/2017  . Diabetes mellitus without complication ([place]) 03/54/6568  . Chest pain 09/08/2017  . Hypokalemia 09/08/2017    Past Surgical History:  Procedure Laterality Date  . btl    . LEFT HEART CATH AND CORONARY ANGIOGRAPHY N/A 09/12/2017   Procedure: LEFT HEART CATH AND CORONARY ANGIOGRAPHY;  Surgeon: Dixie Dials, MD;  Location: [facility];  Service: Cardiovascular;  Laterality: N/A;    OB History   No obstetric history on file.      Home Medications    Prior to Admission medications   Medication Sig Start Date End Date Taking? Authorizing Provider  acetaminophen (TYLENOL) 325 MG tablet Take 325-650 mg by mouth every 6 (six) hours as needed (for headaches).    [provider]  amLODipine (NORVASC) 5 MG tablet Take 5 mg by mouth daily.    [provider]  aspirin EC 81 MG tablet Take 81 mg by mouth daily.    [provider]  cetirizine (ZYRTEC) 10 MG tablet Take 10 mg by mouth daily.    [provider]  doxycycline (VIBRAMYCIN) 100 MG capsule Take 1 capsule (100 mg  total) by mouth 2 (two) times daily. 10/04/18   Tasia Catchings, Amy V, PA-C  fluticasone (FLONASE) 50 MCG/ACT nasal spray Place 2 sprays into both nostrils daily. 09/29/18   Tasia Catchings, Amy V, PA-C  ipratropium (ATROVENT) 0.06 % nasal spray Place 2 sprays into both nostrils 4 (four) times daily. 09/29/18   Tasia Catchings, Amy V, PA-C  metFORMIN (GLUCOPHAGE-XR) 500 MG 24 hr tablet Take 2 tablets (1,000 mg total) by mouth daily with supper. 09/14/17   Mariel Aloe, MD  metoprolol succinate (TOPROL XL) 50 MG 24 hr tablet Take 50 mg by mouth daily.    [provider]  predniSONE (DELTASONE) 50 MG tablet Take 1 tablet (50 mg total) by mouth daily. 09/29/18   Tasia Catchings, Amy V, PA-C  ranitidine (ZANTAC) 150 MG capsule Take 150 mg by mouth daily.    [provider]  Semaglutide,0.25 or 0.5MG /DOS, (OZEMPIC, 0.25 OR 0.5 MG/DOSE,) 2 MG/1.5ML SOPN Inject 0.5 mg into the skin once a week. 07/13/18   Glendale Chard, MD  telmisartan-hydrochlorothiazide (MICARDIS HCT) 80-25 MG tablet Take 1 tablet by mouth daily.    [provider]    Family History Family History  Problem Relation Age of Onset  . Coronary artery disease Maternal Grandmother   . Hypertension Mother   . Diabetes Mother   . Hypertension Father   . Diabetes Father   . Sarcoidosis Father  Social History Social History   Tobacco Use  . Smoking status: Never Smoker  . Smokeless tobacco: Never Used  Substance Use Topics  . Alcohol use: No  . Drug use: Never     Allergies   Patient has no known allergies.   Review of Systems Review of Systems  Reason unable to perform ROS: See HPI as above.     Physical Exam Triage Vital Signs ED Triage Vitals  Enc Vitals Group     BP 09/29/18 0842 128/83     Pulse Rate 09/29/18 0842 85     Resp 09/29/18 0842 18     Temp 09/29/18 0842 98.3 F (36.8 C)     Temp Source 09/29/18 0842 Oral     SpO2 09/29/18 0842 96 %     Weight --      Height --      Head Circumference --      Peak Flow --       Pain Score 09/29/18 0843 4     Pain Loc --      Pain Edu? --      Excl. in [place]? --    No data found.  Updated Vital Signs BP 128/83 (BP Location: Right Arm)   Pulse 85   Temp 98.3 F (36.8 C) (Oral)   Resp 18   LMP 09/11/2018   SpO2 96%   Physical Exam Constitutional:      General: She is not in acute distress.    Appearance: She is well-developed. She is not ill-appearing, toxic-appearing or diaphoretic.  HENT:     Head: Normocephalic and atraumatic.     Right Ear: Tympanic membrane, ear canal and external ear normal. Tympanic membrane is not erythematous or bulging.     Left Ear: Tympanic membrane, ear canal and external ear normal. Tympanic membrane is not erythematous or bulging.     Nose: Congestion present.     Right Sinus: Maxillary sinus tenderness present. No frontal sinus tenderness.     Left Sinus: Maxillary sinus tenderness present. No frontal sinus tenderness.     Mouth/Throat:     Pharynx: Uvula midline.  Eyes:     Conjunctiva/sclera: Conjunctivae normal.     Pupils: Pupils are equal, round, and reactive to light.  Neck:     Musculoskeletal: Normal range of motion and neck supple.  Cardiovascular:     Rate and Rhythm: Normal rate and regular rhythm.     Heart sounds: Normal heart sounds. No murmur. No friction rub. No gallop.   Pulmonary:     Effort: Pulmonary effort is normal.     Breath sounds: Normal breath sounds. No decreased breath sounds, wheezing, rhonchi or rales.  Lymphadenopathy:     Cervical: No cervical adenopathy.  Skin:    General: Skin is warm and dry.  Neurological:     Mental Status: She is alert and oriented to person, place, and time.  Psychiatric:        Behavior: Behavior normal.        Judgment: Judgment normal.      UC Treatments / Results  Labs (all labs ordered are listed, but only abnormal results are displayed) Labs Reviewed - No data to display  EKG None  Radiology No results found.  Procedures Procedures  (including critical care time)  Medications Ordered in UC Medications - No data to display  Initial Impression / Assessment and Plan / UC Course  I have reviewed the triage vital signs and the  nursing notes.  Pertinent labs & imaging results that were available during my care of the patient were reviewed by me and considered in my medical decision making (see chart for details).    Discussed with patient history and exam most consistent with viral URI. Prednisone for sinus pressure/cough. Symptomatic treatment as needed. Push fluids. Rx of doxycycline sent to pharmacy, can fill in 5 days if symptoms not improving. Return precautions given.   Final Clinical Impressions(s) / UC Diagnoses   Final diagnoses:  Viral URI with cough    ED Prescriptions    Medication Sig Dispense Auth. Provider   fluticasone (FLONASE) 50 MCG/ACT nasal spray Place 2 sprays into both nostrils daily. 1 g Yu, Amy V, PA-C   ipratropium (ATROVENT) 0.06 % nasal spray Place 2 sprays into both nostrils 4 (four) times daily. 15 mL Yu, Amy V, PA-C   predniSONE (DELTASONE) 50 MG tablet Take 1 tablet (50 mg total) by mouth daily. 5 tablet Yu, Amy V, PA-C   doxycycline (VIBRAMYCIN) 100 MG capsule Take 1 capsule (100 mg total) by mouth 2 (two) times daily. 20 capsule Tobin Chad, [place] 09/29/18 [phone]

## 2018-11-13 ENCOUNTER — Encounter: Payer: Self-pay | Admitting: Internal Medicine

## 2018-11-13 ENCOUNTER — Ambulatory Visit (INDEPENDENT_AMBULATORY_CARE_PROVIDER_SITE_OTHER): Admitting: Internal Medicine

## 2018-11-13 VITALS — BP 120/88 | HR 73 | Temp 98.5°F | Ht 64.6 in | Wt 216.4 lb

## 2018-11-13 DIAGNOSIS — E1169 Type 2 diabetes mellitus with other specified complication: Secondary | ICD-10-CM

## 2018-11-13 DIAGNOSIS — E669 Obesity, unspecified: Secondary | ICD-10-CM

## 2018-11-13 DIAGNOSIS — Z6836 Body mass index (BMI) 36.0-36.9, adult: Secondary | ICD-10-CM

## 2018-11-13 DIAGNOSIS — I152 Hypertension secondary to endocrine disorders: Secondary | ICD-10-CM

## 2018-11-13 DIAGNOSIS — E1159 Type 2 diabetes mellitus with other circulatory complications: Secondary | ICD-10-CM

## 2018-11-13 DIAGNOSIS — I10 Essential (primary) hypertension: Secondary | ICD-10-CM

## 2018-11-13 DIAGNOSIS — E66812 Obesity, class 2: Secondary | ICD-10-CM

## 2018-11-13 NOTE — Progress Notes (Signed)
Subjective:     Patient ID: Gabrielle Perkins , female    DOB: 1976-09-12 , 43 y.o.   MRN: [id number]   Chief Complaint  Patient presents with  . Diabetes  . Hypertension    HPI  Diabetes  She presents for her follow-up diabetic visit. She has type 2 diabetes mellitus. There are no hypoglycemic associated symptoms. There are no diabetic associated symptoms. There are no hypoglycemic complications. Risk factors for coronary artery disease include diabetes mellitus, dyslipidemia, hypertension, obesity and sedentary lifestyle. Eye exam is current.  Hypertension  This is a chronic problem. The current episode started more than 1 year ago. The problem has been gradually improving since onset. The problem is controlled.   She reports compliance with meds.   Past Medical History:  Diagnosis Date  . Diabetes mellitus without complication ([place])   . Hypertension      Family History  Problem Relation Age of Onset  . Coronary artery disease Maternal Grandmother   . Hypertension Mother   . Diabetes Mother   . Hypertension Father   . Diabetes Father   . Sarcoidosis Father      Current Outpatient Medications:  .  acetaminophen (TYLENOL) 500 MG tablet, Take 500 mg by mouth every 6 (six) hours as needed., Disp: , Rfl:  .  amLODipine (NORVASC) 5 MG tablet, Take 5 mg by mouth daily., Disp: , Rfl:  .  aspirin EC 81 MG tablet, Take 81 mg by mouth daily., Disp: , Rfl:  .  cetirizine (ZYRTEC) 10 MG tablet, Take 10 mg by mouth daily., Disp: , Rfl:  .  fluticasone (FLONASE) 50 MCG/ACT nasal spray, Place 2 sprays into both nostrils daily., Disp: 1 g, Rfl: 0 .  metFORMIN (GLUCOPHAGE-XR) 500 MG 24 hr tablet, Take 2 tablets (1,000 mg total) by mouth daily with supper., Disp: , Rfl:  .  metoprolol succinate (TOPROL XL) 50 MG 24 hr tablet, Take 50 mg by mouth daily., Disp: , Rfl:  .  ranitidine (ZANTAC) 150 MG capsule, Take 150 mg by mouth daily., Disp: , Rfl:  .  Semaglutide,0.25 or 0.5MG/DOS,  (OZEMPIC, 0.25 OR 0.5 MG/DOSE,) 2 MG/1.5ML SOPN, Inject 0.5 mg into the skin once a week., Disp: 12 pen, Rfl: 2 .  telmisartan-hydrochlorothiazide (MICARDIS HCT) 80-25 MG tablet, Take 1 tablet by mouth daily., Disp: , Rfl:  .  ipratropium (ATROVENT) 0.06 % nasal spray, Place 2 sprays into both nostrils 4 (four) times daily. (Patient not taking: Reported on 11/13/2018), Disp: 15 mL, Rfl: 0   No Known Allergies   Review of Systems  Constitutional: Negative.   Respiratory: Negative.   Cardiovascular: Negative.   Gastrointestinal: Negative.   Neurological: Negative.   Psychiatric/Behavioral: Negative.      Today's Vitals   11/13/18 1556  BP: 120/88  Pulse: 73  Temp: 98.5 F (36.9 C)  TempSrc: Oral  Weight: 216 lb 6.4 oz (98.2 kg)  Height: 5' 4.6" (1.641 m)   Body mass index is 36.46 kg/m.   Objective:  Physical Exam Vitals signs and nursing note reviewed.  Constitutional:      Appearance: Normal appearance.  HENT:     Head: Normocephalic and atraumatic.  Cardiovascular:     Rate and Rhythm: Normal rate and regular rhythm.     Heart sounds: Normal heart sounds.  Pulmonary:     Effort: Pulmonary effort is normal.     Breath sounds: Normal breath sounds.  Skin:    General: Skin is warm.  Neurological:  General: No focal deficit present.     Mental Status: She is alert.  Psychiatric:        Mood and Affect: Mood normal.        Behavior: Behavior normal.         Assessment And Plan:     1. Obesity, diabetes, and hypertension syndrome ([place])  She was congratulated on her 2 pound weight loss since Oct 2019. She is encouraged to strive for five pound weight loss with every visit.   I will check labs as listed below. She is encouraged to avoid sugary beverages and processed foods.   Her bp is controlled. She is encouraged to continue with current meds and limit her salt intake.   - Lipid panel - CMP14+EGFR - Hemoglobin A1c  2. Class 2 severe obesity due to  excess calories with serious comorbidity and body mass index (BMI) of 36.0 to 36.9 in adult [facility])  Importance of achieving optimal weight to decrease risk of cardiovascular disease and cancers was discussed with the patient in full detail. She is encouraged to start slowly - start with 10 minutes twice daily at least three to four days per week and to gradually build to 30 minutes five days weekly. She was given tips to incorporate more activity into her daily routine - take stairs when possible, park farther away from her job, grocery stores, etc.   [name], MD

## 2018-11-13 NOTE — Patient Instructions (Signed)

## 2018-11-14 LAB — CMP14+EGFR
A/G RATIO: 1.6 (ref 1.2–2.2)
ALBUMIN: 4.2 g/dL (ref 3.8–4.8)
ALK PHOS: 91 IU/L (ref 39–117)
ALT: 19 IU/L (ref 0–32)
AST: 21 IU/L (ref 0–40)
BILIRUBIN TOTAL: 0.5 mg/dL (ref 0.0–1.2)
BUN / CREAT RATIO: 8 — AB (ref 9–23)
BUN: 7 mg/dL (ref 6–24)
CHLORIDE: 101 mmol/L (ref 96–106)
CO2: 24 mmol/L (ref 20–29)
Calcium: 9.3 mg/dL (ref 8.7–10.2)
Creatinine, Ser: 0.91 mg/dL (ref 0.57–1.00)
GFR calc Af Amer: 91 mL/min/{1.73_m2} (ref >59)
GFR calc non Af Amer: 79 mL/min/{1.73_m2} (ref >59)
GLUCOSE: 97 mg/dL (ref 65–99)
Globulin, Total: 2.7 g/dL (ref 1.5–4.5)
POTASSIUM: 3.8 mmol/L (ref 3.5–5.2)
SODIUM: 140 mmol/L (ref 134–144)
Total Protein: 6.9 g/dL (ref 6.0–8.5)

## 2018-11-14 LAB — LIPID PANEL
Chol/HDL Ratio: 3.2 ratio (ref 0.0–4.4)
Cholesterol, Total: 131 mg/dL (ref 100–199)
HDL: 41 mg/dL (ref >39)
LDL Calculated: 72 mg/dL (ref 0–99)
TRIGLYCERIDES: 90 mg/dL (ref 0–149)
VLDL CHOLESTEROL CAL: 18 mg/dL (ref 5–40)

## 2018-11-14 LAB — HEMOGLOBIN A1C
Est. average glucose Bld gHb Est-mCnc: 137 mg/dL
HEMOGLOBIN A1C: 6.4 % — AB (ref 4.8–5.6)

## 2019-01-01 ENCOUNTER — Telehealth: Payer: Self-pay

## 2019-01-01 ENCOUNTER — Other Ambulatory Visit: Payer: Self-pay

## 2019-01-01 MED ORDER — TELMISARTAN-HCTZ 80-25 MG PO TABS: 1.0000 | ORAL_TABLET | Freq: Every day | ORAL | 1 refills | Status: DC

## 2019-01-01 NOTE — Telephone Encounter (Signed)
-----   Message from Glendale Chard, MD sent at 01/01/2019 12:22 PM EDT ----- She should first try pepcid over the counter. If this works for her, I will send a prescription to [facility].  ----- Message ----- From: Michelle Nasuti, [place] Sent: 01/01/2019  11:30 AM EDT To: Glendale Chard, MD  The patient called and said that she needed a medication to replace the Ranitidine because [facility] no longer carries the medication.

## 2019-01-01 NOTE — Telephone Encounter (Signed)
I left the pt a message to call back.

## 2019-01-02 ENCOUNTER — Telehealth: Payer: Self-pay

## 2019-01-02 NOTE — Telephone Encounter (Signed)
Let Gabrielle Perkins handle the revision. Pls forward this to her. I believe there is a fee for revisions.

## 2019-01-02 NOTE — Telephone Encounter (Signed)
Patient returned call and stated that on the form it says she has 1-2 episodes and 16-32 hours and when she turned in her forms they took the minimum and denied some of her time so she would like for you to change it to say 2 episodes and 32 hours. YRL,RMA

## 2019-02-09 ENCOUNTER — Telehealth: Payer: Self-pay | Admitting: Internal Medicine

## 2019-02-09 NOTE — Telephone Encounter (Signed)
Patient has agreed to a virtual visit with dr.sanders on 02/13/19

## 2019-02-13 ENCOUNTER — Encounter: Payer: Self-pay | Admitting: Internal Medicine

## 2019-02-13 ENCOUNTER — Other Ambulatory Visit: Payer: Self-pay

## 2019-02-13 ENCOUNTER — Ambulatory Visit (INDEPENDENT_AMBULATORY_CARE_PROVIDER_SITE_OTHER): Admitting: Internal Medicine

## 2019-02-13 VITALS — Ht 64.6 in

## 2019-02-13 DIAGNOSIS — I10 Essential (primary) hypertension: Secondary | ICD-10-CM | POA: Diagnosis not present

## 2019-02-13 DIAGNOSIS — E1169 Type 2 diabetes mellitus with other specified complication: Secondary | ICD-10-CM

## 2019-02-13 DIAGNOSIS — E1159 Type 2 diabetes mellitus with other circulatory complications: Secondary | ICD-10-CM | POA: Diagnosis not present

## 2019-02-13 DIAGNOSIS — E119 Type 2 diabetes mellitus without complications: Secondary | ICD-10-CM

## 2019-02-13 DIAGNOSIS — K219 Gastro-esophageal reflux disease without esophagitis: Secondary | ICD-10-CM

## 2019-02-13 DIAGNOSIS — E669 Obesity, unspecified: Secondary | ICD-10-CM | POA: Diagnosis not present

## 2019-02-13 MED ORDER — OMEPRAZOLE 40 MG PO CPDR
40.0000 mg | DELAYED_RELEASE_CAPSULE | Freq: Every day | ORAL | 1 refills | Status: DC
Start: 2019-02-13 — End: 2019-05-10

## 2019-02-13 NOTE — Progress Notes (Addendum)
Virtual Visit via Video   This visit type was conducted due to national recommendations for restrictions regarding the COVID-19 Pandemic (e.g. social distancing) in an effort to limit this patient's exposure and mitigate transmission in our community.  Due to her co-morbid illnesses, this patient is at least at moderate risk for complications without adequate follow up.  This format is felt to be most appropriate for this patient at this time.  All issues noted in this document were discussed and addressed.  A limited physical exam was performed with this format.    This visit type was conducted due to national recommendations for restrictions regarding the COVID-19 Pandemic (e.g. social distancing) in an effort to limit this patient's exposure and mitigate transmission in our community.  Patients identity confirmed using two different identifiers.  This format is felt to be most appropriate for this patient at this time.  All issues noted in this document were discussed and addressed.  No physical exam was performed (except for noted visual exam findings with Video Visits).    Date:  02/13/2019   ID:  Gabrielle Perkins, DOB 10-13-1975, MRN [id number]  Patient Location:  Home  Provider location:   Office    Chief Complaint:  DM check  History of Present Illness:    Gabrielle Perkins is a 43 y.o. female who presents via video conferencing for a telehealth visit today.    The patient does not have symptoms concerning for COVID-19 infection (fever, chills, cough, or new shortness of breath).   She presents today for virtual visit. She prefers this method of contact due to COVID-19 pandemic.  Diabetes  She presents for her follow-up diabetic visit. She has type 2 diabetes mellitus. There are no hypoglycemic associated symptoms. There are no diabetic associated symptoms. There are no hypoglycemic complications. Risk factors for coronary artery disease include diabetes mellitus, dyslipidemia,  hypertension, obesity and sedentary lifestyle. Current diabetic treatment includes oral agent (monotherapy). She is compliant with treatment some of the time. She is following a generally healthy diet. She participates in exercise intermittently. Her breakfast blood glucose is taken between 8-9 am. Her breakfast blood glucose range is generally 90-110 mg/dl. Eye exam is current.  Hypertension  This is a chronic problem. The current episode started more than 1 year ago. The problem has been gradually improving since onset. The problem is controlled. Past treatments include angiotensin blockers and diuretics. The current treatment provides moderate improvement. Compliance problems include exercise.      Past Medical History:  Diagnosis Date  . Diabetes mellitus without complication ([place])   . Hypertension    Past Surgical History:  Procedure Laterality Date  . btl    . LEFT HEART CATH AND CORONARY ANGIOGRAPHY N/A 09/12/2017   Procedure: LEFT HEART CATH AND CORONARY ANGIOGRAPHY;  Surgeon: Dixie Dials, MD;  Location: [place] [facility];  Service: Cardiovascular;  Laterality: N/A;     Current Meds  Medication Sig  . acetaminophen (TYLENOL) 500 MG tablet Take 500 mg by mouth every 6 (six) hours as needed.  Marland Kitchen amLODipine (NORVASC) 5 MG tablet Take 5 mg by mouth daily.  Marland Kitchen aspirin EC 81 MG tablet Take 81 mg by mouth daily.  . cetirizine (ZYRTEC) 10 MG tablet Take 10 mg by mouth daily.  . fluticasone (FLONASE) 50 MCG/ACT nasal spray Place 2 sprays into both nostrils daily.  . metFORMIN (GLUCOPHAGE-XR) 500 MG 24 hr tablet Take 2 tablets (1,000 mg total) by mouth daily with supper.  Marland Kitchen  metoprolol succinate (TOPROL XL) 50 MG 24 hr tablet Take 50 mg by mouth daily.  . Semaglutide,0.25 or 0.5MG/DOS, (OZEMPIC, 0.25 OR 0.5 MG/DOSE,) 2 MG/1.5ML SOPN Inject 0.5 mg into the skin once a week.  . telmisartan-hydrochlorothiazide (MICARDIS HCT) 80-25 MG tablet Take 1 tablet by mouth daily.     Allergies:    Patient has no known allergies.   Social History   Tobacco Use  . Smoking status: Never Smoker  . Smokeless tobacco: Never Used  Substance Use Topics  . Alcohol use: No  . Drug use: Never     Family Hx: The patient's family history includes Coronary artery disease in her maternal grandmother; Diabetes in her father and mother; Hypertension in her father and mother; Sarcoidosis in her father.  ROS:   Please see the history of present illness.    Review of Systems  Constitutional: Negative.   Respiratory: Negative.   Cardiovascular: Negative.   Gastrointestinal: Negative.   Neurological: Negative.   Psychiatric/Behavioral: Negative.     All other systems reviewed and are negative.   Labs/Other Tests and Data Reviewed:    Recent Labs: 11/13/2018: ALT 19; BUN 7; Creatinine, Ser 0.91; Potassium 3.8; Sodium 140   Recent Lipid Panel Lab Results  Component Value Date/Time   CHOL 131 11/13/2018 04:49 PM   TRIG 90 11/13/2018 04:49 PM   HDL 41 11/13/2018 04:49 PM   CHOLHDL 3.2 11/13/2018 04:49 PM   CHOLHDL 3.2 09/09/2017 05:39 AM   LDLCALC 72 11/13/2018 04:49 PM    Wt Readings from Last 3 Encounters:  11/13/18 216 lb 6.4 oz (98.2 kg)  07/13/18 218 lb 6.4 oz (99.1 kg)  04/06/18 213 lb 12.8 oz (97 kg)     Exam:    Vital Signs:  Ht 5' 4.6" (1.641 m)   LMP  (LMP Unknown)   BMI 36.46 kg/m     Physical Exam  Constitutional: She is oriented to person, place, and time and well-developed, well-nourished, and in no distress.  HENT:  Head: Normocephalic and atraumatic.  Neck: Normal range of motion.  Pulmonary/Chest: Effort normal.  Neurological: She is alert and oriented to person, place, and time.  Psychiatric: Affect normal.  Nursing note and vitals reviewed.   ASSESSMENT & PLAN:     1. Obesity, diabetes, and hypertension syndrome ([place])  She agrees to come in for [facility] tomorrow. She will be back in August 2020 for her next physical examination. I will consider  increasing the dose of her Ozempic to 31m daily at that time. Importance of regular exercise was discussed with the patient.   HTN - chronic. She will continue with current meds. She is encouraged to avoid adding salt to her foods.   Obesity - Importance of achieving optimal weight to decrease risk of cardiovascular disease and cancers was discussed with the patient in full detail. She is encouraged to start slowly - start with 10 minutes twice daily at least three to four days per week and to gradually build to 30 minutes five days weekly. She was given tips to incorporate more activity into her daily routine - take stairs when possible, park farther away from her job, grocery stores, etc.   - Hemoglobin A1c; Future - BMP8+EGFR; Future  2. Gastroesophageal reflux disease without esophagitis  She was given rx omeprazole 447mto take once daily. She is reminded that she should stop eating 3 hours prior to going to bed. She is also advised to avoid lying down after eating.  COVID-19 Education: The signs and symptoms of COVID-19 were discussed with the patient and how to seek care for testing (follow up with PCP or arrange E-visit).  The importance of social distancing was discussed today.  Patient Risk:   After full review of this patients clinical status, I feel that they are at least moderate risk at this time.  Time:   Today, I have spent 14 minutes/ 35 seconds with the patient with telehealth technology discussing above diagnoses.     Medication Adjustments/Labs and Tests Ordered: Current medicines are reviewed at length with the patient today.  Concerns regarding medicines are outlined above.   Tests Ordered: Orders Placed This Encounter  Procedures  . Hemoglobin A1c  . BMP8+EGFR    Medication Changes: Meds ordered this encounter  Medications  . omeprazole (PRILOSEC) 40 MG capsule    Sig: Take 1 capsule (40 mg total) by mouth daily.    Dispense:  90 capsule    Refill:  1     Disposition:  Follow up in 3 month(s)  Signed, [name], MD

## 2019-02-13 NOTE — Patient Instructions (Signed)
Diabetes Mellitus and Exercise Exercising regularly is important for your overall health, especially when you have diabetes (diabetes mellitus). Exercising is not only about losing weight. It has many other health benefits, such as increasing muscle strength and bone density and reducing body fat and stress. This leads to improved fitness, flexibility, and endurance, all of which result in better overall health. Exercise has additional benefits for people with diabetes, including:  Reducing appetite.  Helping to lower and control blood glucose.  Lowering blood pressure.  Helping to control amounts of fatty substances (lipids) in the blood, such as cholesterol and triglycerides.  Helping the body to respond better to insulin (improving insulin sensitivity).  Reducing how much insulin the body needs.  Decreasing the risk for heart disease by: ? Lowering cholesterol and triglyceride levels. ? Increasing the levels of good cholesterol. ? Lowering blood glucose levels. What is my activity plan? Your health care provider or certified diabetes educator can help you make a plan for the type and frequency of exercise (activity plan) that works for you. Make sure that you:  Do at least 150 minutes of moderate-intensity or vigorous-intensity exercise each week. This could be brisk walking, biking, or water aerobics. ? Do stretching and strength exercises, such as yoga or weightlifting, at least 2 times a week. ? Spread out your activity over at least 3 days of the week.  Get some form of physical activity every day. ? Do not go more than 2 days in a row without some kind of physical activity. ? Avoid being inactive for more than 30 minutes at a time. Take frequent breaks to walk or stretch.  Choose a type of exercise or activity that you enjoy, and set realistic goals.  Start slowly, and gradually increase the intensity of your exercise over time. What do I need to know about managing my  diabetes?   Check your blood glucose before and after exercising. ? If your blood glucose is 240 mg/dL (13.3 mmol/L) or higher before you exercise, check your urine for ketones. If you have ketones in your urine, do not exercise until your blood glucose returns to normal. ? If your blood glucose is 100 mg/dL (5.6 mmol/L) or lower, eat a snack containing 15-20 grams of carbohydrate. Check your blood glucose 15 minutes after the snack to make sure that your level is above 100 mg/dL (5.6 mmol/L) before you start your exercise.  Know the symptoms of low blood glucose (hypoglycemia) and how to treat it. Your risk for hypoglycemia increases during and after exercise. Common symptoms of hypoglycemia can include: ? Hunger. ? Anxiety. ? Sweating and feeling clammy. ? Confusion. ? Dizziness or feeling light-headed. ? Increased heart rate or palpitations. ? Blurry vision. ? Tingling or numbness around the mouth, lips, or tongue. ? Tremors or shakes. ? Irritability.  Keep a rapid-acting carbohydrate snack available before, during, and after exercise to help prevent or treat hypoglycemia.  Avoid injecting insulin into areas of the body that are going to be exercised. For example, avoid injecting insulin into: ? The arms, when playing tennis. ? The legs, when jogging.  Keep records of your exercise habits. Doing this can help you and your health care provider adjust your diabetes management plan as needed. Write down: ? Food that you eat before and after you exercise. ? Blood glucose levels before and after you exercise. ? The type and amount of exercise you have done. ? When your insulin is expected to peak, if you use   insulin. Avoid exercising at times when your insulin is peaking.  When you start a new exercise or activity, work with your health care provider to make sure the activity is safe for you, and to adjust your insulin, medicines, or food intake as needed.  Drink plenty of water while  you exercise to prevent dehydration or heat stroke. Drink enough fluid to keep your urine clear or pale yellow. Summary  Exercising regularly is important for your overall health, especially when you have diabetes (diabetes mellitus).  Exercising has many health benefits, such as increasing muscle strength and bone density and reducing body fat and stress.  Your health care provider or certified diabetes educator can help you make a plan for the type and frequency of exercise (activity plan) that works for you.  When you start a new exercise or activity, work with your health care provider to make sure the activity is safe for you, and to adjust your insulin, medicines, or food intake as needed. This information is not intended to replace advice given to you by your health care provider. Make sure you discuss any questions you have with your health care provider. Document Released: 11/27/2003 Document Revised: 03/17/2017 Document Reviewed: 02/16/2016 Elsevier Interactive Patient Education  2019 Elsevier Inc.  

## 2019-02-14 ENCOUNTER — Other Ambulatory Visit: Payer: Self-pay

## 2019-02-14 ENCOUNTER — Other Ambulatory Visit

## 2019-02-14 DIAGNOSIS — E669 Obesity, unspecified: Secondary | ICD-10-CM

## 2019-02-14 DIAGNOSIS — E1159 Type 2 diabetes mellitus with other circulatory complications: Secondary | ICD-10-CM

## 2019-02-15 LAB — BMP8+EGFR
BUN/Creatinine Ratio: 14 (ref 9–23)
BUN: 9 mg/dL (ref 6–24)
CO2: 24 mmol/L (ref 20–29)
Calcium: 9.4 mg/dL (ref 8.7–10.2)
Chloride: 100 mmol/L (ref 96–106)
Creatinine, Ser: 0.66 mg/dL (ref 0.57–1.00)
GFR calc Af Amer: 127 mL/min/{1.73_m2} (ref >59)
GFR calc non Af Amer: 110 mL/min/{1.73_m2} (ref >59)
Glucose: 97 mg/dL (ref 65–99)
Potassium: 3.9 mmol/L (ref 3.5–5.2)
Sodium: 141 mmol/L (ref 134–144)

## 2019-02-15 LAB — HEMOGLOBIN A1C
Est. average glucose Bld gHb Est-mCnc: 123 mg/dL
Hgb A1c MFr Bld: 5.9 % — ABNORMAL HIGH (ref 4.8–5.6)

## 2019-05-10 ENCOUNTER — Other Ambulatory Visit: Payer: Self-pay

## 2019-05-10 ENCOUNTER — Ambulatory Visit (INDEPENDENT_AMBULATORY_CARE_PROVIDER_SITE_OTHER): Admitting: Internal Medicine

## 2019-05-10 ENCOUNTER — Encounter: Payer: Self-pay | Admitting: Internal Medicine

## 2019-05-10 VITALS — BP 120/76 | HR 73 | Temp 98.6°F | Ht 64.6 in | Wt 210.6 lb

## 2019-05-10 DIAGNOSIS — E1159 Type 2 diabetes mellitus with other circulatory complications: Secondary | ICD-10-CM

## 2019-05-10 DIAGNOSIS — Z0000 Encounter for general adult medical examination without abnormal findings: Secondary | ICD-10-CM

## 2019-05-10 DIAGNOSIS — E1169 Type 2 diabetes mellitus with other specified complication: Secondary | ICD-10-CM

## 2019-05-10 DIAGNOSIS — I10 Essential (primary) hypertension: Secondary | ICD-10-CM

## 2019-05-10 DIAGNOSIS — E1165 Type 2 diabetes mellitus with hyperglycemia: Secondary | ICD-10-CM

## 2019-05-10 DIAGNOSIS — E669 Obesity, unspecified: Secondary | ICD-10-CM

## 2019-05-10 DIAGNOSIS — I152 Hypertension secondary to endocrine disorders: Secondary | ICD-10-CM

## 2019-05-10 DIAGNOSIS — Z6835 Body mass index (BMI) 35.0-35.9, adult: Secondary | ICD-10-CM

## 2019-05-10 LAB — POCT URINALYSIS DIPSTICK
Bilirubin, UA: NEGATIVE
Blood, UA: NEGATIVE
Glucose, UA: NEGATIVE
Ketones, UA: NEGATIVE
Leukocytes, UA: NEGATIVE
Nitrite, UA: NEGATIVE
Protein, UA: NEGATIVE
Spec Grav, UA: >=1.03 — AB (ref 1.010–1.025)
Urobilinogen, UA: 0.2 E.U./dL
pH, UA: 5.5 (ref 5.0–8.0)

## 2019-05-10 MED ORDER — ACCU-CHEK GUIDE VI STRP: ORAL_STRIP | 2 refills | Status: DC

## 2019-05-10 MED ORDER — ASPIRIN EC 81 MG PO TBEC: 81.0000 mg | DELAYED_RELEASE_TABLET | Freq: Every day | ORAL | 2 refills | Status: DC

## 2019-05-10 MED ORDER — AMLODIPINE BESYLATE 5 MG PO TABS: 5.0000 mg | ORAL_TABLET | Freq: Every day | ORAL | 2 refills | Status: DC

## 2019-05-10 MED ORDER — OMEPRAZOLE 40 MG PO CPDR: 40.0000 mg | DELAYED_RELEASE_CAPSULE | Freq: Every day | ORAL | 2 refills | Status: DC

## 2019-05-10 MED ORDER — METOPROLOL SUCCINATE ER 50 MG PO TB24: 50.0000 mg | ORAL_TABLET | Freq: Every day | ORAL | 2 refills | Status: DC

## 2019-05-10 MED ORDER — MICROLET LANCETS MISC: 2 refills | Status: AC

## 2019-05-10 MED ORDER — TELMISARTAN-HCTZ 80-25 MG PO TABS: 1.0000 | ORAL_TABLET | Freq: Every day | ORAL | 2 refills | Status: DC

## 2019-05-10 MED ORDER — FREESTYLE FREEDOM LITE W/DEVICE KIT: PACK | 1 refills | Status: AC

## 2019-05-10 MED ORDER — FREESTYLE LITE TEST VI STRP: ORAL_STRIP | 2 refills | Status: DC

## 2019-05-10 MED ORDER — FLUTICASONE PROPIONATE 50 MCG/ACT NA SUSP: 2.0000 | Freq: Every day | NASAL | 2 refills | Status: DC

## 2019-05-10 NOTE — Patient Instructions (Signed)
Health Maintenance, Female Adopting a healthy lifestyle and getting preventive care are important in promoting health and wellness. Ask your health care provider about:  The right schedule for you to have regular tests and exams.  Things you can do on your own to prevent diseases and keep yourself healthy. What should I know about diet, weight, and exercise? Eat a healthy diet   Eat a diet that includes plenty of vegetables, fruits, low-fat dairy products, and lean protein.  Do not eat a lot of foods that are high in solid fats, added sugars, or sodium. Maintain a healthy weight Body mass index (BMI) is used to identify weight problems. It estimates body fat based on height and weight. Your health care provider can help determine your BMI and help you achieve or maintain a healthy weight. Get regular exercise Get regular exercise. This is one of the most important things you can do for your health. Most adults should:  Exercise for at least 150 minutes each week. The exercise should increase your heart rate and make you sweat (moderate-intensity exercise).  Do strengthening exercises at least twice a week. This is in addition to the moderate-intensity exercise.  Spend less time sitting. Even light physical activity can be beneficial. Watch cholesterol and blood lipids Have your blood tested for lipids and cholesterol at 42 years of age, then have this test every 5 years. Have your cholesterol levels checked more often if:  Your lipid or cholesterol levels are high.  You are older than 42 years of age.  You are at high risk for heart disease. What should I know about cancer screening? Depending on your health history and family history, you may need to have cancer screening at various ages. This may include screening for:  Breast cancer.  Cervical cancer.  Colorectal cancer.  Skin cancer.  Lung cancer. What should I know about heart disease, diabetes, and high blood  pressure? Blood pressure and heart disease  High blood pressure causes heart disease and increases the risk of stroke. This is more likely to develop in people who have high blood pressure readings, are of African descent, or are overweight.  Have your blood pressure checked: ? Every 3-5 years if you are 18-39 years of age. ? Every year if you are 40 years old or older. Diabetes Have regular diabetes screenings. This checks your fasting blood sugar level. Have the screening done:  Once every three years after age 40 if you are at a normal weight and have a low risk for diabetes.  More often and at a younger age if you are overweight or have a high risk for diabetes. What should I know about preventing infection? Hepatitis B If you have a higher risk for hepatitis B, you should be screened for this virus. Talk with your health care provider to find out if you are at risk for hepatitis B infection. Hepatitis C Testing is recommended for:  Everyone born from 1945 through 1965.  Anyone with known risk factors for hepatitis C. Sexually transmitted infections (STIs)  Get screened for STIs, including gonorrhea and chlamydia, if: ? You are sexually active and are younger than 42 years of age. ? You are older than 42 years of age and your health care provider tells you that you are at risk for this type of infection. ? Your sexual activity has changed since you were last screened, and you are at increased risk for chlamydia or gonorrhea. Ask your health care provider if   you are at risk.  Ask your health care provider about whether you are at high risk for HIV. Your health care provider may recommend a prescription medicine to help prevent HIV infection. If you choose to take medicine to prevent HIV, you should first get tested for HIV. You should then be tested every 3 months for as long as you are taking the medicine. Pregnancy  If you are about to stop having your period (premenopausal) and  you may become pregnant, seek counseling before you get pregnant.  Take 400 to 800 micrograms (mcg) of folic acid every day if you become pregnant.  Ask for birth control (contraception) if you want to prevent pregnancy. Osteoporosis and menopause Osteoporosis is a disease in which the bones lose minerals and strength with aging. This can result in bone fractures. If you are 65 years old or older, or if you are at risk for osteoporosis and fractures, ask your health care provider if you should:  Be screened for bone loss.  Take a calcium or vitamin D supplement to lower your risk of fractures.  Be given hormone replacement therapy (HRT) to treat symptoms of menopause. Follow these instructions at home: Lifestyle  Do not use any products that contain nicotine or tobacco, such as cigarettes, e-cigarettes, and chewing tobacco. If you need help quitting, ask your health care provider.  Do not use street drugs.  Do not share needles.  Ask your health care provider for help if you need support or information about quitting drugs. Alcohol use  Do not drink alcohol if: ? Your health care provider tells you not to drink. ? You are pregnant, may be pregnant, or are planning to become pregnant.  If you drink alcohol: ? Limit how much you use to 0-1 drink a day. ? Limit intake if you are breastfeeding.  Be aware of how much alcohol is in your drink. In the [place] one drink equals one 12 oz bottle of beer (355 mL), one 5 oz glass of wine (148 mL), or one 1 oz glass of hard liquor (44 mL). General instructions  Schedule regular health, dental, and eye exams.  Stay current with your vaccines.  Tell your health care provider if: ? You often feel depressed. ? You have ever been abused or do not feel safe at home. Summary  Adopting a healthy lifestyle and getting preventive care are important in promoting health and wellness.  Follow your health care provider's instructions about healthy  diet, exercising, and getting tested or screened for diseases.  Follow your health care provider's instructions on monitoring your cholesterol and blood pressure. This information is not intended to replace advice given to you by your health care provider. Make sure you discuss any questions you have with your health care provider. Document Released: 03/22/2011 Document Revised: 08/30/2018 Document Reviewed: 08/30/2018 Elsevier Patient Education  2020 Elsevier Inc.  

## 2019-05-10 NOTE — Progress Notes (Signed)
Subjective:     Patient ID: Gabrielle Perkins , female    DOB: [birth year] , 43 y.o.   MRN: [id number]   Chief Complaint  Patient presents with  . Annual Exam  . Diabetes  . Hypertension    HPI  She is here today for a full physical examination.  She is followed by Dr. Cletis Media for her pelvic exams.   Diabetes She presents for her follow-up diabetic visit. She has type 2 diabetes mellitus. There are no hypoglycemic associated symptoms. There are no diabetic associated symptoms. There are no hypoglycemic complications. Risk factors for coronary artery disease include diabetes mellitus, dyslipidemia, hypertension, obesity and sedentary lifestyle. She participates in exercise intermittently. Eye exam is current.  Hypertension This is a chronic problem. The current episode started more than 1 year ago. The problem has been gradually improving since onset. The problem is controlled. Risk factors for coronary artery disease include diabetes mellitus, obesity and sedentary lifestyle. Past treatments include angiotensin blockers and diuretics. The current treatment provides moderate improvement. Compliance problems include exercise.      Past Medical History:  Diagnosis Date  . Diabetes mellitus without complication ([place])   . Hypertension      Family History  Problem Relation Age of Onset  . Coronary artery disease Maternal Grandmother   . Hypertension Mother   . Diabetes Mother   . Hypertension Father   . Diabetes Father   . Sarcoidosis Father      Current Outpatient Medications:  .  acetaminophen (TYLENOL) 500 MG tablet, Take 500 mg by mouth every 6 (six) hours as needed., Disp: , Rfl:  .  cetirizine (ZYRTEC) 10 MG tablet, Take 10 mg by mouth daily., Disp: , Rfl:  .  metFORMIN (GLUCOPHAGE-XR) 500 MG 24 hr tablet, Take 2 tablets (1,000 mg total) by mouth daily with supper., Disp: , Rfl:  .  Semaglutide,0.25 or 0.5MG/DOS, (OZEMPIC, 0.25 OR 0.5 MG/DOSE,) 2 MG/1.5ML SOPN, Inject 0.5 mg  into the skin once a week., Disp: 12 pen, Rfl: 2 .  amLODipine (NORVASC) 5 MG tablet, Take 1 tablet (5 mg total) by mouth daily., Disp: 90 tablet, Rfl: 2 .  aspirin EC 81 MG tablet, Take 1 tablet (81 mg total) by mouth daily., Disp: 90 tablet, Rfl: 2 .  Blood Glucose Monitoring Suppl (FREESTYLE FREEDOM LITE) w/Device KIT, Use as directed to check blood sugars 2 times per day dx: e11.22, Disp: 1 kit, Rfl: 1 .  fluticasone (FLONASE) 50 MCG/ACT nasal spray, Place 2 sprays into both nostrils daily., Disp: 16 g, Rfl: 2 .  glucose blood (FREESTYLE LITE) test strip, Use as instructed to check blood sugars 2 times per day dx: e11.22, Disp: 150 each, Rfl: 2 .  metoprolol succinate (TOPROL XL) 50 MG 24 hr tablet, Take 1 tablet (50 mg total) by mouth daily., Disp: 90 tablet, Rfl: 2 .  Microlet Lancets MISC, Use as directed to check blood sugars 2 times per day dx: e11.22, Disp: 150 each, Rfl: 2 .  omeprazole (PRILOSEC) 40 MG capsule, Take 1 capsule (40 mg total) by mouth daily., Disp: 90 capsule, Rfl: 2 .  telmisartan-hydrochlorothiazide (MICARDIS HCT) 80-25 MG tablet, Take 1 tablet by mouth daily., Disp: 90 tablet, Rfl: 2   No Known Allergies   Review of Systems  Constitutional: Negative.   HENT: Negative.   Eyes: Negative.   Respiratory: Negative.   Cardiovascular: Negative.   Endocrine: Negative.   Genitourinary: Negative.   Musculoskeletal: Negative.   Skin: Negative.  Allergic/Immunologic: Negative.   Neurological: Negative.   Hematological: Negative.   Psychiatric/Behavioral: Negative.      Today's Vitals   05/10/19 1011  BP: 120/76  Pulse: 73  Temp: 98.6 F (37 C)  TempSrc: Oral  Weight: 210 lb 9.6 oz (95.5 kg)  Height: 5' 4.6" (1.641 m)   Body mass index is 35.48 kg/m.   Objective:  Physical Exam Vitals signs and nursing note reviewed.  Constitutional:      Appearance: Normal appearance.  HENT:     Head: Normocephalic and atraumatic.     Right Ear: Tympanic membrane,  ear canal and external ear normal.     Left Ear: Tympanic membrane, ear canal and external ear normal.     Nose: Nose normal.     Mouth/Throat:     Mouth: Mucous membranes are moist.     Pharynx: Oropharynx is clear.  Eyes:     Extraocular Movements: Extraocular movements intact.     Conjunctiva/sclera: Conjunctivae normal.     Pupils: Pupils are equal, round, and reactive to light.  Neck:     Musculoskeletal: Normal range of motion and neck supple.  Cardiovascular:     Rate and Rhythm: Normal rate and regular rhythm.     Pulses:          Dorsalis pedis pulses are 2+ on the right side and 2+ on the left side.     Heart sounds: Normal heart sounds.  Pulmonary:     Effort: Pulmonary effort is normal.     Breath sounds: Normal breath sounds.  Chest:     Breasts: Tanner Score is 5.        Right: Normal.        Left: Normal.  Abdominal:     General: Abdomen is flat. Bowel sounds are normal.     Palpations: Abdomen is soft.  Genitourinary:    Comments: deferred Musculoskeletal: Normal range of motion.  Feet:     Right foot:     Protective Sensation: 5 sites tested. 5 sites sensed.     Skin integrity: Skin integrity normal.     Toenail Condition: Right toenails are normal.     Left foot:     Protective Sensation: 5 sites tested. 5 sites sensed.     Skin integrity: Skin integrity normal.     Toenail Condition: Left toenails are normal.  Skin:    General: Skin is warm and dry.  Neurological:     General: No focal deficit present.     Mental Status: She is alert and oriented to person, place, and time.  Psychiatric:        Mood and Affect: Mood normal.        Behavior: Behavior normal.         Assessment And Plan:     1. Health care maintenance  A full exam was performed. She is encouraged to perform monthly self breast exams.  PATIENT HAS BEEN ADVISED TO GET 30-45 MINUTES REGULAR EXERCISE NO LESS THAN FOUR TO FIVE DAYS PER WEEK - BOTH WEIGHTBEARING EXERCISES AND AEROBIC  ARE RECOMMENDED.  SHE WAS ADVISED TO FOLLOW A HEALTHY DIET WITH AT LEAST SIX FRUITS/VEGGIES PER DAY, DECREASE INTAKE OF RED MEAT, AND TO INCREASE FISH INTAKE TO TWO DAYS PER WEEK.  MEATS/FISH SHOULD NOT BE FRIED, BAKED OR BROILED IS PREFERABLE.  I SUGGEST WEARING SPF 50 SUNSCREEN ON EXPOSED PARTS AND ESPECIALLY WHEN IN THE DIRECT SUNLIGHT FOR AN EXTENDED PERIOD OF TIME.  PLEASE  AVOID FAST FOOD RESTAURANTS AND INCREASE YOUR WATER INTAKE.   2. Obesity, diabetes, and hypertension syndrome ([place])  Diabetic foot exam was performed. She declined pneumonia vaccine today. She will think about getting it in the future.  I DISCUSSED WITH THE PATIENT AT LENGTH REGARDING THE GOALS OF GLYCEMIC CONTROL AND POSSIBLE LONG-TERM COMPLICATIONS.  I  ALSO STRESSED THE IMPORTANCE OF COMPLIANCE WITH HOME GLUCOSE MONITORING, DIETARY RESTRICTIONS INCLUDING AVOIDANCE OF SUGARY DRINKS/PROCESSED FOODS,  ALONG WITH REGULAR EXERCISE.  I  ALSO STRESSED THE IMPORTANCE OF ANNUAL EYE EXAMS, SELF FOOT CARE AND COMPLIANCE WITH OFFICE VISITS.  Her bp is well controlled. She will continue with current meds. Importance of dietary and medication compliance was discussed with the patient.    3. Class 2 severe obesity due to excess calories with serious comorbidity and body mass index (BMI) of 35.0 to 35.9 in adult [facility])  Importance of achieving optimal weight to decrease risk of cardiovascular disease and cancers was discussed with the patient in full detail. Importance of regular exercise was discussed with the patient.  She is encouraged to start slowly - start with 10 minutes twice daily at least three to four days per week and to gradually build to 30 minutes five days weekly. She was given tips to incorporate more activity into her daily routine - take stairs when possible, park farther away from her job, grocery stores, etc.    [name], MD    THE PATIENT IS ENCOURAGED TO PRACTICE SOCIAL DISTANCING DUE TO THE COVID-19  PANDEMIC.

## 2019-05-11 LAB — CMP14+EGFR
ALT: 14 IU/L (ref 0–32)
AST: 13 IU/L (ref 0–40)
Albumin/Globulin Ratio: 1.5 (ref 1.2–2.2)
Albumin: 4.5 g/dL (ref 3.8–4.8)
Alkaline Phosphatase: 112 IU/L (ref 39–117)
BUN/Creatinine Ratio: 8 — ABNORMAL LOW (ref 9–23)
BUN: 6 mg/dL (ref 6–24)
Bilirubin Total: 0.2 mg/dL (ref 0.0–1.2)
CO2: 23 mmol/L (ref 20–29)
Calcium: 9.5 mg/dL (ref 8.7–10.2)
Chloride: 98 mmol/L (ref 96–106)
Creatinine, Ser: 0.73 mg/dL (ref 0.57–1.00)
GFR calc Af Amer: 118 mL/min/{1.73_m2} (ref >59)
GFR calc non Af Amer: 103 mL/min/{1.73_m2} (ref >59)
Globulin, Total: 3.1 g/dL (ref 1.5–4.5)
Glucose: 119 mg/dL — ABNORMAL HIGH (ref 65–99)
Potassium: 3.7 mmol/L (ref 3.5–5.2)
Sodium: 138 mmol/L (ref 134–144)
Total Protein: 7.6 g/dL (ref 6.0–8.5)

## 2019-05-11 LAB — CBC
Hematocrit: 42.1 % (ref 34.0–46.6)
Hemoglobin: 12.9 g/dL (ref 11.1–15.9)
MCH: 27.9 pg (ref 26.6–33.0)
MCHC: 30.6 g/dL — ABNORMAL LOW (ref 31.5–35.7)
MCV: 91 fL (ref 79–97)
Platelets: 441 10*3/uL (ref 150–450)
RBC: 4.62 x10E6/uL (ref 3.77–5.28)
RDW: 13.7 % (ref 11.7–15.4)
WBC: 6.3 10*3/uL (ref 3.4–10.8)

## 2019-05-11 LAB — HEMOGLOBIN A1C
Est. average glucose Bld gHb Est-mCnc: 126 mg/dL
Hgb A1c MFr Bld: 6 % — ABNORMAL HIGH (ref 4.8–5.6)

## 2019-05-11 LAB — MICROALBUMIN / CREATININE URINE RATIO
Creatinine, Urine: 277.3 mg/dL
Microalb/Creat Ratio: 5 mg/g creat (ref 0–29)
Microalbumin, Urine: 15.2 ug/mL

## 2019-05-11 LAB — LIPID PANEL
Chol/HDL Ratio: 2.6 ratio (ref 0.0–4.4)
Cholesterol, Total: 120 mg/dL (ref 100–199)
HDL: 46 mg/dL (ref >39)
LDL Calculated: 58 mg/dL (ref 0–99)
Triglycerides: 82 mg/dL (ref 0–149)
VLDL Cholesterol Cal: 16 mg/dL (ref 5–40)

## 2019-06-21 ENCOUNTER — Encounter: Payer: Self-pay | Admitting: Internal Medicine

## 2019-06-25 ENCOUNTER — Ambulatory Visit
Admission: RE | Admit: 2019-06-25 | Discharge: 2019-06-25 | Disposition: A | Discharge status: Home / Self Care | Source: Ambulatory Visit | Attending: Internal Medicine | Admitting: Internal Medicine

## 2019-06-25 ENCOUNTER — Other Ambulatory Visit: Payer: Self-pay

## 2019-06-25 DIAGNOSIS — Z0000 Encounter for general adult medical examination without abnormal findings: Secondary | ICD-10-CM

## 2019-08-07 ENCOUNTER — Other Ambulatory Visit: Payer: Self-pay

## 2019-08-07 ENCOUNTER — Ambulatory Visit (INDEPENDENT_AMBULATORY_CARE_PROVIDER_SITE_OTHER): Admitting: Nurse Practitioner

## 2019-08-07 ENCOUNTER — Encounter: Payer: Self-pay | Admitting: Nurse Practitioner

## 2019-08-07 VITALS — BP 122/84 | HR 75 | Temp 98.5°F | Ht 65.0 in | Wt 213.4 lb

## 2019-08-07 DIAGNOSIS — Z23 Encounter for immunization: Secondary | ICD-10-CM

## 2019-08-07 DIAGNOSIS — R220 Localized swelling, mass and lump, head: Secondary | ICD-10-CM | POA: Diagnosis not present

## 2019-08-07 NOTE — Progress Notes (Signed)
Subjective:     Patient ID: Gabrielle Perkins , female    DOB: November 14, 1975 , 43 y.o.   MRN: [id number]   Chief Complaint  Patient presents with  . knot on head    patient stated she has a knot on her head for the past 2 months. patient denies any injuries or pain     HPI  She has noticed a "knot" to her forehead about 2 months ago, it is now getting bigger.  No pain, denies any injury.  Denies dizziness or lightheadedness. She is having frontal headaches  Will take tylenol with good results.     Past Medical History:  Diagnosis Date  . Diabetes mellitus without complication ([place])   . Hypertension      Family History  Problem Relation Age of Onset  . Coronary artery disease Maternal Grandmother   . Hypertension Mother   . Diabetes Mother   . Hypertension Father   . Diabetes Father   . Sarcoidosis Father   . Breast cancer Neg Hx      Current Outpatient Medications:  .  acetaminophen (TYLENOL) 500 MG tablet, Take 500 mg by mouth every 6 (six) hours as needed., Disp: , Rfl:  .  amLODipine (NORVASC) 5 MG tablet, Take 1 tablet (5 mg total) by mouth daily., Disp: 90 tablet, Rfl: 2 .  aspirin EC 81 MG tablet, Take 1 tablet (81 mg total) by mouth daily., Disp: 90 tablet, Rfl: 2 .  Blood Glucose Monitoring Suppl (FREESTYLE FREEDOM LITE) w/Device KIT, Use as directed to check blood sugars 2 times per day dx: e11.22, Disp: 1 kit, Rfl: 1 .  cetirizine (ZYRTEC) 10 MG tablet, Take 10 mg by mouth daily., Disp: , Rfl:  .  fluticasone (FLONASE) 50 MCG/ACT nasal spray, Place 2 sprays into both nostrils daily., Disp: 16 g, Rfl: 2 .  glucose blood (FREESTYLE LITE) test strip, Use as instructed to check blood sugars 2 times per day dx: e11.22, Disp: 150 each, Rfl: 2 .  metFORMIN (GLUCOPHAGE-XR) 500 MG 24 hr tablet, Take 2 tablets (1,000 mg total) by mouth daily with supper., Disp: , Rfl:  .  metoprolol succinate (TOPROL XL) 50 MG 24 hr tablet, Take 1 tablet (50 mg total) by mouth daily., Disp: 90  tablet, Rfl: 2 .  Microlet Lancets MISC, Use as directed to check blood sugars 2 times per day dx: e11.22, Disp: 150 each, Rfl: 2 .  omeprazole (PRILOSEC) 40 MG capsule, Take 1 capsule (40 mg total) by mouth daily., Disp: 90 capsule, Rfl: 2 .  Semaglutide,0.25 or 0.5MG/DOS, (OZEMPIC, 0.25 OR 0.5 MG/DOSE,) 2 MG/1.5ML SOPN, Inject 0.5 mg into the skin once a week., Disp: 12 pen, Rfl: 2 .  telmisartan-hydrochlorothiazide (MICARDIS HCT) 80-25 MG tablet, Take 1 tablet by mouth daily., Disp: 90 tablet, Rfl: 2   No Known Allergies   Review of Systems  Constitutional: Negative.   HENT:       Knot to frontal head for 2 months  Eyes: Negative for photophobia and visual disturbance.  Respiratory: Negative.   Cardiovascular: Negative.   Neurological: Negative for dizziness and headaches (frontal headache).  Psychiatric/Behavioral: Negative.      Today's Vitals   08/07/19 1207  BP: 122/84  Pulse: 75  Temp: 98.5 F (36.9 C)  TempSrc: Oral  Weight: 213 lb 6.4 oz (96.8 kg)  Height: _0  (1.651 m)  PainSc: 0-No pain   Body mass index is 35.51 kg/m.   Objective:  Physical Exam Constitutional:  Appearance: Normal appearance.  HENT:     Head:      Comments: Mid frontal head with soft lump Pulmonary:     Effort: Pulmonary effort is normal.  Skin:    General: Skin is warm and dry.     Findings: No rash.  Neurological:     General: No focal deficit present.     Mental Status: She is alert and oriented to person, place, and time.  Psychiatric:        Mood and Affect: Mood normal.        Behavior: Behavior normal.        Thought Content: Thought content normal.        Judgment: Judgment normal.         Assessment And Plan:     1. Localized swelling, mass, and lump of head  Soft palpable lump to mid frontal head  Will send for an ultrasound  Advised if has symptoms of severe headache, dizziness, or changes in vision to call office or go to ER for further evaluation - US  Soft Tissue Head/Neck; Future  2. Need for influenza vaccination  Influenza vaccine given in office  Advised to take Tylenol as needed for muscle aches or fever - Flu Vaccine QUAD 6+ mos PF IM (Fluarix Quad PF)   Minette Brine, FNP    THE PATIENT IS ENCOURAGED TO PRACTICE SOCIAL DISTANCING DUE TO THE COVID-19 PANDEMIC.

## 2019-08-08 ENCOUNTER — Ambulatory Visit: Admitting: Nurse Practitioner

## 2019-08-23 ENCOUNTER — Ambulatory Visit
Admission: RE | Admit: 2019-08-23 | Discharge: 2019-08-23 | Disposition: A | Discharge status: Home / Self Care | Source: Ambulatory Visit | Attending: Nurse Practitioner | Admitting: Nurse Practitioner

## 2019-08-23 DIAGNOSIS — R220 Localized swelling, mass and lump, head: Secondary | ICD-10-CM

## 2019-08-24 ENCOUNTER — Other Ambulatory Visit: Payer: Self-pay | Admitting: Nurse Practitioner

## 2019-08-24 DIAGNOSIS — R220 Localized swelling, mass and lump, head: Secondary | ICD-10-CM

## 2019-09-03 ENCOUNTER — Other Ambulatory Visit: Payer: Self-pay | Admitting: Nurse Practitioner

## 2019-09-03 DIAGNOSIS — R220 Localized swelling, mass and lump, head: Secondary | ICD-10-CM

## 2019-09-10 ENCOUNTER — Other Ambulatory Visit: Payer: Self-pay

## 2019-09-10 ENCOUNTER — Encounter: Payer: Self-pay | Admitting: Internal Medicine

## 2019-09-10 ENCOUNTER — Ambulatory Visit (INDEPENDENT_AMBULATORY_CARE_PROVIDER_SITE_OTHER): Admitting: Internal Medicine

## 2019-09-10 VITALS — BP 128/76 | HR 84 | Temp 98.4°F | Ht 65.0 in | Wt 215.0 lb

## 2019-09-10 DIAGNOSIS — E1159 Type 2 diabetes mellitus with other circulatory complications: Secondary | ICD-10-CM | POA: Insufficient documentation

## 2019-09-10 DIAGNOSIS — E1165 Type 2 diabetes mellitus with hyperglycemia: Secondary | ICD-10-CM

## 2019-09-10 DIAGNOSIS — Z23 Encounter for immunization: Secondary | ICD-10-CM

## 2019-09-10 DIAGNOSIS — I10 Essential (primary) hypertension: Secondary | ICD-10-CM

## 2019-09-10 DIAGNOSIS — I152 Hypertension secondary to endocrine disorders: Secondary | ICD-10-CM | POA: Insufficient documentation

## 2019-09-10 DIAGNOSIS — D170 Benign lipomatous neoplasm of skin and subcutaneous tissue of head, face and neck: Secondary | ICD-10-CM

## 2019-09-10 DIAGNOSIS — E669 Obesity, unspecified: Secondary | ICD-10-CM | POA: Insufficient documentation

## 2019-09-10 DIAGNOSIS — Z6835 Body mass index (BMI) 35.0-35.9, adult: Secondary | ICD-10-CM

## 2019-09-10 MED ORDER — CETIRIZINE HCL 10 MG PO TABS: 10.0000 mg | ORAL_TABLET | Freq: Every day | ORAL | 2 refills | Status: DC

## 2019-09-10 MED ORDER — FLUTICASONE PROPIONATE 50 MCG/ACT NA SUSP: 2.0000 | Freq: Every day | NASAL | 2 refills | Status: DC

## 2019-09-10 NOTE — Progress Notes (Signed)
This visit occurred during the SARS-CoV-2 public health emergency.  Safety protocols were in place, including screening questions prior to the visit, additional usage of staff PPE, and extensive cleaning of exam room while observing appropriate contact time as indicated for disinfecting solutions.  Subjective:     Patient ID: Gabrielle Perkins , female    DOB: 09/05/1976 , 43 y.o.   MRN: [id number]   Chief Complaint  Patient presents with  . Hypertension  . Diabetes  . Immunizations    tdap / pneumovax 23    HPI  She is here today for a HTN/DM f/u. She has no specific concerns or complaints at this time.   Hypertension This is a chronic problem. The current episode started more than 1 year ago. The problem has been gradually improving since onset. The problem is controlled. Past treatments include angiotensin blockers and diuretics. The current treatment provides moderate improvement. Compliance problems include exercise.   Diabetes She presents for her follow-up diabetic visit. She has type 2 diabetes mellitus. There are no hypoglycemic associated symptoms. There are no diabetic associated symptoms. There are no hypoglycemic complications. Risk factors for coronary artery disease include diabetes mellitus, dyslipidemia, hypertension, obesity and sedentary lifestyle. Current diabetic treatment includes oral agent (monotherapy). She is compliant with treatment some of the time. She is following a generally healthy diet. She participates in exercise intermittently. Her breakfast blood glucose is taken between 8-9 am. Her breakfast blood glucose range is generally 90-110 mg/dl. Eye exam is current.     Past Medical History:  Diagnosis Date  . Diabetes mellitus without complication ([place])   . Hypertension      Family History  Problem Relation Age of Onset  . Coronary artery disease Maternal Grandmother   . Hypertension Mother   . Diabetes Mother   . Hypertension Father   . Diabetes  Father   . Sarcoidosis Father   . Breast cancer Neg Hx      Current Outpatient Medications:  .  acetaminophen (TYLENOL) 500 MG tablet, Take 500 mg by mouth every 6 (six) hours as needed., Disp: , Rfl:  .  amLODipine (NORVASC) 5 MG tablet, Take 1 tablet (5 mg total) by mouth daily., Disp: 90 tablet, Rfl: 2 .  aspirin EC 81 MG tablet, Take 1 tablet (81 mg total) by mouth daily., Disp: 90 tablet, Rfl: 2 .  Blood Glucose Monitoring Suppl (FREESTYLE FREEDOM LITE) w/Device KIT, Use as directed to check blood sugars 2 times per day dx: e11.22, Disp: 1 kit, Rfl: 1 .  glucose blood (FREESTYLE LITE) test strip, Use as instructed to check blood sugars 2 times per day dx: e11.22, Disp: 150 each, Rfl: 2 .  metFORMIN (GLUCOPHAGE-XR) 500 MG 24 hr tablet, Take 2 tablets (1,000 mg total) by mouth daily with supper., Disp: , Rfl:  .  metoprolol succinate (TOPROL XL) 50 MG 24 hr tablet, Take 1 tablet (50 mg total) by mouth daily., Disp: 90 tablet, Rfl: 2 .  Microlet Lancets MISC, Use as directed to check blood sugars 2 times per day dx: e11.22, Disp: 150 each, Rfl: 2 .  omeprazole (PRILOSEC) 40 MG capsule, Take 1 capsule (40 mg total) by mouth daily., Disp: 90 capsule, Rfl: 2 .  Semaglutide,0.25 or 0.5MG/DOS, (OZEMPIC, 0.25 OR 0.5 MG/DOSE,) 2 MG/1.5ML SOPN, Inject 0.5 mg into the skin once a week., Disp: 12 pen, Rfl: 2 .  telmisartan-hydrochlorothiazide (MICARDIS HCT) 80-25 MG tablet, Take 1 tablet by mouth daily., Disp: 90 tablet, Rfl: 2 .  cetirizine (ZYRTEC) 10 MG tablet, Take 1 tablet (10 mg total) by mouth daily., Disp: 90 tablet, Rfl: 2 .  fluticasone (FLONASE) 50 MCG/ACT nasal spray, Place 2 sprays into both nostrils daily., Disp: 16 g, Rfl: 2   No Known Allergies   Review of Systems  Constitutional: Negative.   Respiratory: Negative.   Cardiovascular: Negative.   Gastrointestinal: Negative.   Skin:       She wants explanation of her u/s results. States she has bump on her head.   Neurological:  Negative.   Psychiatric/Behavioral: Negative.      Today's Vitals   09/10/19 0927  BP: 128/76  Pulse: 84  Temp: 98.4 F (36.9 C)  TempSrc: Oral  Weight: 215 lb (97.5 kg)  Height: '5\' 5"'  (1.651 m)   Body mass index is 35.78 kg/m.   Objective:  Physical Exam Vitals and nursing note reviewed.  Constitutional:      Appearance: Normal appearance.  HENT:     Head: Normocephalic and atraumatic.  Cardiovascular:     Rate and Rhythm: Normal rate and regular rhythm.     Heart sounds: Normal heart sounds.  Pulmonary:     Effort: Pulmonary effort is normal.     Breath sounds: Normal breath sounds.  Skin:    General: Skin is warm.     Comments: Slight area of swelling in frontal area. No overlying erythema. Non-tender to palpation  Neurological:     General: No focal deficit present.     Mental Status: She is alert.  Psychiatric:        Mood and Affect: Mood normal.        Behavior: Behavior normal.         Assessment And Plan:     1. Essential hypertension, benign  Chronic, well controlled. She will continue with current meds. She is encouraged to avoid adding salt to her foods.   2. Uncontrolled type 2 diabetes mellitus with hyperglycemia ([facility])  Chronic. Previous hba1c results reviewed. She will rto in four months for re-evaluation.   - Hemoglobin A1c - BMP8+EGFR  3. Lipoma of scalp  I reviewed her u/s results in full detail. She will let me know if area starts to enlarge.   4. Need for vaccination  - Tdap vaccine greater than or equal to 7yo IM - Pneumococcal polysaccharide vaccine 23-valent (for > 79 year old)  5. Class 2 severe obesity due to excess calories with serious comorbidity and body mass index (BMI) of 35.0 to 35.9 in adult [facility])  She is encouraged to strive to lose ten pounds prior to her next visit in 4 months. She is advised to incorporate more exercise into her daily routine.  [name], MD    THE PATIENT IS ENCOURAGED TO PRACTICE  SOCIAL DISTANCING DUE TO THE COVID-19 PANDEMIC.

## 2019-09-10 NOTE — Patient Instructions (Signed)
Diabetes Mellitus and Exercise Exercising regularly is important for your overall health, especially when you have diabetes (diabetes mellitus). Exercising is not only about losing weight. It has many other health benefits, such as increasing muscle strength and bone density and reducing body fat and stress. This leads to improved fitness, flexibility, and endurance, all of which result in better overall health. Exercise has additional benefits for people with diabetes, including:  Reducing appetite.  Helping to lower and control blood glucose.  Lowering blood pressure.  Helping to control amounts of fatty substances (lipids) in the blood, such as cholesterol and triglycerides.  Helping the body to respond better to insulin (improving insulin sensitivity).  Reducing how much insulin the body needs.  Decreasing the risk for heart disease by: ? Lowering cholesterol and triglyceride levels. ? Increasing the levels of good cholesterol. ? Lowering blood glucose levels. What is my activity plan? Your health care provider or certified diabetes educator can help you make a plan for the type and frequency of exercise (activity plan) that works for you. Make sure that you:  Do at least 150 minutes of moderate-intensity or vigorous-intensity exercise each week. This could be brisk walking, biking, or water aerobics. ? Do stretching and strength exercises, such as yoga or weightlifting, at least 2 times a week. ? Spread out your activity over at least 3 days of the week.  Get some form of physical activity every day. ? Do not go more than 2 days in a row without some kind of physical activity. ? Avoid being inactive for more than 30 minutes at a time. Take frequent breaks to walk or stretch.  Choose a type of exercise or activity that you enjoy, and set realistic goals.  Start slowly, and gradually increase the intensity of your exercise over time. What do I need to know about managing my  diabetes?   Check your blood glucose before and after exercising. ? If your blood glucose is 240 mg/dL (13.3 mmol/L) or higher before you exercise, check your urine for ketones. If you have ketones in your urine, do not exercise until your blood glucose returns to normal. ? If your blood glucose is 100 mg/dL (5.6 mmol/L) or lower, eat a snack containing 15-20 grams of carbohydrate. Check your blood glucose 15 minutes after the snack to make sure that your level is above 100 mg/dL (5.6 mmol/L) before you start your exercise.  Know the symptoms of low blood glucose (hypoglycemia) and how to treat it. Your risk for hypoglycemia increases during and after exercise. Common symptoms of hypoglycemia can include: ? Hunger. ? Anxiety. ? Sweating and feeling clammy. ? Confusion. ? Dizziness or feeling light-headed. ? Increased heart rate or palpitations. ? Blurry vision. ? Tingling or numbness around the mouth, lips, or tongue. ? Tremors or shakes. ? Irritability.  Keep a rapid-acting carbohydrate snack available before, during, and after exercise to help prevent or treat hypoglycemia.  Avoid injecting insulin into areas of the body that are going to be exercised. For example, avoid injecting insulin into: ? The arms, when playing tennis. ? The legs, when jogging.  Keep records of your exercise habits. Doing this can help you and your health care provider adjust your diabetes management plan as needed. Write down: ? Food that you eat before and after you exercise. ? Blood glucose levels before and after you exercise. ? The type and amount of exercise you have done. ? When your insulin is expected to peak, if you use   insulin. Avoid exercising at times when your insulin is peaking.  When you start a new exercise or activity, work with your health care provider to make sure the activity is safe for you, and to adjust your insulin, medicines, or food intake as needed.  Drink plenty of water while  you exercise to prevent dehydration or heat stroke. Drink enough fluid to keep your urine clear or pale yellow. Summary  Exercising regularly is important for your overall health, especially when you have diabetes (diabetes mellitus).  Exercising has many health benefits, such as increasing muscle strength and bone density and reducing body fat and stress.  Your health care provider or certified diabetes educator can help you make a plan for the type and frequency of exercise (activity plan) that works for you.  When you start a new exercise or activity, work with your health care provider to make sure the activity is safe for you, and to adjust your insulin, medicines, or food intake as needed. This information is not intended to replace advice given to you by your health care provider. Make sure you discuss any questions you have with your health care provider. Document Released: 11/27/2003 Document Revised: 03/31/2017 Document Reviewed: 02/16/2016 Elsevier Patient Education  2020 [employer]  

## 2019-09-11 LAB — BMP8+EGFR
BUN/Creatinine Ratio: 9 (ref 9–23)
BUN: 7 mg/dL (ref 6–24)
CO2: 23 mmol/L (ref 20–29)
Calcium: 8.9 mg/dL (ref 8.7–10.2)
Chloride: 103 mmol/L (ref 96–106)
Creatinine, Ser: 0.76 mg/dL (ref 0.57–1.00)
GFR calc Af Amer: 112 mL/min/{1.73_m2} (ref >59)
GFR calc non Af Amer: 97 mL/min/{1.73_m2} (ref >59)
Glucose: 115 mg/dL — ABNORMAL HIGH (ref 65–99)
Potassium: 3.6 mmol/L (ref 3.5–5.2)
Sodium: 140 mmol/L (ref 134–144)

## 2019-09-11 LAB — HEMOGLOBIN A1C
Est. average glucose Bld gHb Est-mCnc: 134 mg/dL
Hgb A1c MFr Bld: 6.3 % — ABNORMAL HIGH (ref 4.8–5.6)

## 2019-09-20 ENCOUNTER — Ambulatory Visit (INDEPENDENT_AMBULATORY_CARE_PROVIDER_SITE_OTHER): Admitting: Plastic Surgery

## 2019-09-20 ENCOUNTER — Encounter: Payer: Self-pay | Admitting: Plastic Surgery

## 2019-09-20 ENCOUNTER — Other Ambulatory Visit: Payer: Self-pay

## 2019-09-20 VITALS — BP 147/86 | HR 85 | Temp 97.7°F | Ht 65.0 in | Wt 217.0 lb

## 2019-09-20 DIAGNOSIS — D170 Benign lipomatous neoplasm of skin and subcutaneous tissue of head, face and neck: Secondary | ICD-10-CM

## 2019-09-20 NOTE — Progress Notes (Signed)
Referring Provider [place] Chard, [place],  [place] [address]   CC:  Chief Complaint  Patient presents with  . Advice Only    swelling mass for head      Gabrielle Perkins is an 43 y.o. female.  HPI: She presents with a 55-monthhistory of a growing mass in her upper forehead.  This is intermittently painful and bothers her.  She was sent by her dermatologist to have it excised.  She has had no prior lesions similar to this excised.  Ultrasound suggest lipoma.  No Known Allergies  Outpatient Encounter Medications as of 09/20/2019  Medication Sig  . acetaminophen (TYLENOL) 500 MG tablet Take 500 mg by mouth every 6 (six) hours as needed.  .Marland KitchenamLODipine (NORVASC) 5 MG tablet Take 1 tablet (5 mg total) by mouth daily.  .Marland Kitchenaspirin EC 81 MG tablet Take 1 tablet (81 mg total) by mouth daily.  . Blood Glucose Monitoring Suppl (FREESTYLE FREEDOM LITE) w/Device KIT Use as directed to check blood sugars 2 times per day dx: e11.22  . cetirizine (ZYRTEC) 10 MG tablet Take 1 tablet (10 mg total) by mouth daily.  . fluticasone (FLONASE) 50 MCG/ACT nasal spray Place 2 sprays into both nostrils daily.  .Marland Kitchenglucose blood (FREESTYLE LITE) test strip Use as instructed to check blood sugars 2 times per day dx: e11.22  . metFORMIN (GLUCOPHAGE-XR) 500 MG 24 hr tablet Take 2 tablets (1,000 mg total) by mouth daily with supper.  . metoprolol succinate (TOPROL XL) 50 MG 24 hr tablet Take 1 tablet (50 mg total) by mouth daily.  . Microlet Lancets MISC Use as directed to check blood sugars 2 times per day dx: e11.22  . omeprazole (PRILOSEC) 40 MG capsule Take 1 capsule (40 mg total) by mouth daily.  . Semaglutide,0.25 or 0.5MG/DOS, (OZEMPIC, 0.25 OR 0.5 MG/DOSE,) 2 MG/1.5ML SOPN Inject 0.5 mg into the skin once a week.  . telmisartan-hydrochlorothiazide (MICARDIS HCT) 80-25 MG tablet Take 1 tablet by mouth daily.   No facility-administered encounter medications on file as of  09/20/2019.     Past Medical History:  Diagnosis Date  . [place]   . Hypertension     Past Surgical History:  Procedure Laterality Date  . btl    . LEFT HEART CATH AND CORONARY ANGIOGRAPHY N/A 09/12/2017   Procedure: LEFT HEART CATH AND CORONARY ANGIOGRAPHY;  Surgeon: KDixie Dials MD;  [facility];  Service: Cardiovascular;  Laterality: N/A;    Family History  Problem Relation Age of Onset  . Coronary artery disease Maternal Grandmother   . Hypertension Mother   . Diabetes Mother   . Hypertension Father   . Diabetes Father   . Sarcoidosis Father   . Breast cancer Neg Hx     Social History   Social History Narrative  . Not on file     Review of Systems General: Denies fevers, chills, weight loss CV: Denies chest pain, shortness of breath, palpitations  Physical Exam Vitals with BMI 09/20/2019 09/10/2019 08/07/2019  Height '5\' 5"'  '5\' 5"'  '5\' 5"'   Weight 217 lbs 215 lbs 213 lbs 6 oz  BMI 36.11 [phone] Systolic [id number] Diastolic 86 76 84  Pulse 85 84 75    General:  No acute distress,  Alert and oriented, Non-Toxic, Normal speech and affect HEENT: Normocephalic atraumatic.  Cranial nerves grossly intact.  Extraocular movements intact.  She has  approximately 4 cm mobile soft tissue mass at the hairline centrally.  This does not appear fixed to the underlying bone.  Assessment/Plan Patient presents with what looks like a lipoma of the central upper forehead.  We discussed excision.  We discussed the risk that include bleeding, infection, damage to surrounding structures, need for additional procedures.  We discussed the potential for recurrence.  She is fully understanding and will plan to get this scheduled for the outpatient surgery center.  Cindra Presume 09/20/2019, 5:31 PM

## 2019-09-25 ENCOUNTER — Telehealth: Payer: Self-pay

## 2019-09-25 NOTE — Telephone Encounter (Signed)
error 

## 2019-09-28 ENCOUNTER — Other Ambulatory Visit: Payer: Self-pay

## 2019-09-28 ENCOUNTER — Encounter (HOSPITAL_BASED_OUTPATIENT_CLINIC_OR_DEPARTMENT_OTHER): Payer: Self-pay | Admitting: Plastic Surgery

## 2019-10-01 ENCOUNTER — Encounter: Payer: Self-pay | Admitting: Surgical

## 2019-10-01 ENCOUNTER — Ambulatory Visit (INDEPENDENT_AMBULATORY_CARE_PROVIDER_SITE_OTHER): Admitting: Surgical

## 2019-10-01 ENCOUNTER — Other Ambulatory Visit: Payer: Self-pay

## 2019-10-01 ENCOUNTER — Other Ambulatory Visit (HOSPITAL_COMMUNITY)
Admission: RE | Admit: 2019-10-01 | Discharge: 2019-10-01 | Disposition: A | Discharge status: Home / Self Care | Source: Ambulatory Visit | Attending: Plastic Surgery | Admitting: Plastic Surgery

## 2019-10-01 VITALS — BP 148/93 | HR 63 | Temp 97.9°F | Ht 64.0 in | Wt 213.0 lb

## 2019-10-01 DIAGNOSIS — Z01812 Encounter for preprocedural laboratory examination: Secondary | ICD-10-CM | POA: Diagnosis present

## 2019-10-01 DIAGNOSIS — D170 Benign lipomatous neoplasm of skin and subcutaneous tissue of head, face and neck: Secondary | ICD-10-CM

## 2019-10-01 DIAGNOSIS — Z20822 Contact with and (suspected) exposure to covid-19: Secondary | ICD-10-CM | POA: Insufficient documentation

## 2019-10-01 MED ORDER — HYDROCODONE-ACETAMINOPHEN 5-325 MG PO TABS: 1.0000 | ORAL_TABLET | Freq: Four times a day (QID) | ORAL | 0 refills | Status: AC | PRN

## 2019-10-01 MED ORDER — ONDANSETRON HCL 4 MG PO TABS: 4.0000 mg | ORAL_TABLET | Freq: Three times a day (TID) | ORAL | 0 refills | Status: DC | PRN

## 2019-10-01 NOTE — Progress Notes (Signed)
   Patient ID: Gabrielle Perkins, female    DOB: 04/03/1976, 43 y.o.   MRN: [id number]  Chief Complaint  Patient presents with  . Pre-op Exam      ICD-10-CM   1. 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  female  with a history of a mass growing on her upper forehead, she has intermittent pain. She had an US 08/23/19 of this area, impression was probable benign scalp lipoma.  She presents for preoperative evaluation for upcoming procedure, excision of forehead scalp lipoma, scheduled for 10/04/19 with Dr. Pace.  The patient has not had problems with anesthesia.  No history of DVT/PE.  No recent colds or fevers.  No nausea or vomiting.  She stopped taking her aspirin 4 days ago.  She reports having a heart cath 2 years ago with no abnormal findings.  No stents were placed.  She has a hx of hypertension, obesity, diabetes. Last A1c 09/10/19 was 6.3.  HTN is well controlled, she reports she did not take her medication yet today. She does not smoke.  Past Medical History: Allergies: No Known Allergies  Current Medications:  Current Outpatient Medications:  .  acetaminophen (TYLENOL) 500 MG tablet, Take 500 mg by mouth every 6 (six) hours as needed., Disp: , Rfl:  .  amLODipine (NORVASC) 5 MG tablet, Take 1 tablet (5 mg total) by mouth daily., Disp: 90 tablet, Rfl: 2 .  aspirin EC 81 MG tablet, Take 1 tablet (81 mg total) by mouth daily., Disp: 90 tablet, Rfl: 2 .  Blood Glucose Monitoring Suppl (FREESTYLE FREEDOM LITE) w/Device KIT, Use as directed to check blood sugars 2 times per day dx: e11.22, Disp: 1 kit, Rfl: 1 .  cetirizine (ZYRTEC) 10 MG tablet, Take 1 tablet (10 mg total) by mouth daily., Disp: 90 tablet, Rfl: 2 .  fluticasone (FLONASE) 50 MCG/ACT nasal spray, Place 2 sprays into both nostrils daily., Disp: 16 g, Rfl: 2 .  glucose blood (FREESTYLE LITE) test strip, Use as instructed to check blood sugars 2 times per day dx: e11.22, Disp:  150 each, Rfl: 2 .  metFORMIN (GLUCOPHAGE-XR) 500 MG 24 hr tablet, Take 2 tablets (1,000 mg total) by mouth daily with supper., Disp: , Rfl:  .  metoprolol succinate (TOPROL XL) 50 MG 24 hr tablet, Take 1 tablet (50 mg total) by mouth daily., Disp: 90 tablet, Rfl: 2 .  Microlet Lancets MISC, Use as directed to check blood sugars 2 times per day dx: e11.22, Disp: 150 each, Rfl: 2 .  omeprazole (PRILOSEC) 40 MG capsule, Take 1 capsule (40 mg total) by mouth daily., Disp: 90 capsule, Rfl: 2 .  Semaglutide,0.25 or 0.5MG/DOS, (OZEMPIC, 0.25 OR 0.5 MG/DOSE,) 2 MG/1.5ML SOPN, Inject 0.5 mg into the skin once a week., Disp: 12 pen, Rfl: 2 .  telmisartan-hydrochlorothiazide (MICARDIS HCT) 80-25 MG tablet, Take 1 tablet by mouth daily., Disp: 90 tablet, Rfl: 2  Past Medical Problems: Past Medical History:  Diagnosis Date  . Diabetes mellitus without complication ([facility])   . Hypertension     Past Surgical History: Past Surgical History:  Procedure Laterality Date  . btl    . LEFT HEART CATH AND CORONARY ANGIOGRAPHY N/A 09/12/2017   Procedure: LEFT HEART CATH AND CORONARY ANGIOGRAPHY;  Surgeon: Kadakia, Ajay, MD;  [facility];  Service: Cardiovascular;  Laterality: N/A;    Social History: Social History     Socioeconomic History  . Marital status: Married    Spouse name: Not on file  . Number of children: Not on file  . Years of education: Not on file  . Highest education level: Not on file  Occupational History  . Not on file  Tobacco Use  . Smoking status: Never Smoker  . Smokeless tobacco: Never Used  Substance and Sexual Activity  . Alcohol use: No  . Drug use: Never  . Sexual activity: Not on file  Other Topics Concern  . Not on file  Social History Narrative  . Not on file   Social Determinants of Health   Financial Resource Strain:   . Difficulty of Paying Living Expenses: Not on file  Food Insecurity:   . Worried About Running Out of Food in the Last Year:  Not on file  . Ran Out of Food in the Last Year: Not on file  Transportation Needs:   . Lack of Transportation (Medical): Not on file  . Lack of Transportation (Non-Medical): Not on file  Physical Activity:   . Days of Exercise per Week: Not on file  . Minutes of Exercise per Session: Not on file  Stress:   . Feeling of Stress : Not on file  Social Connections:   . Frequency of Communication with Friends and Family: Not on file  . Frequency of Social Gatherings with Friends and Family: Not on file  . Attends Religious Services: Not on file  . Active Member of Clubs or Organizations: Not on file  . Attends Club or Organization Meetings: Not on file  . Marital Status: Not on file  Intimate Partner Violence:   . Fear of Current or Ex-Partner: Not on file  . Emotionally Abused: Not on file  . Physically Abused: Not on file  . Sexually Abused: Not on file    Family History: Family History  Problem Relation Age of Onset  . Coronary artery disease Maternal Grandmother   . Hypertension Mother   . Diabetes Mother   . Hypertension Father   . Diabetes Father   . Sarcoidosis Father   . Breast cancer Neg Hx     Review of Systems: Review of Systems  Constitutional: Negative for chills, fever and malaise/fatigue.  Respiratory: Negative.   Cardiovascular: Negative.   Gastrointestinal: Negative.   Genitourinary: Negative.   Musculoskeletal: Negative.   Neurological: Positive for headaches (Occasional). Negative for dizziness and weakness.    Physical Exam: Vital Signs BP (!) 148/93 (BP Location: Right Arm, Patient Position: Sitting, Cuff Size: Normal)   Pulse 63   Temp 97.9 F (36.6 C) (Temporal)   Ht 5' 4" (1.626 m)   Wt 213 lb (96.6 kg)   LMP 09/03/2019   SpO2 99%   BMI 36.56 kg/m  Physical Exam Exam conducted with a chaperone present.  Constitutional:      General: She is not in acute distress.    Appearance: Normal appearance. She is not ill-appearing.  HENT:      Head: Normocephalic and atraumatic. Forehead mass extending into hairline, ~ 4 cm. Eyes:     Pupils: Pupils are equal, round Neck:     Musculoskeletal: Normal range of motion.  Cardiovascular:     Rate and Rhythm: Normal rate and regular rhythm.     Pulses: Normal pulses.     Heart sounds: Normal heart sounds. No murmur.  Pulmonary:     Effort: Pulmonary effort is normal. No respiratory distress.     Breath sounds:   Normal breath sounds. No wheezing.  Abdominal:     General: Abdomen is flat. There is no distension.     Palpations: Abdomen is soft.     Tenderness: There is no abdominal tenderness.  Musculoskeletal: Normal range of motion.  Skin:    General: Skin is warm and dry.     Findings: No erythema or rash.  Neurological:     General: No focal deficit present.     Mental Status: She is alert and oriented to person, place, and time. Mental status is at baseline.     Motor: No weakness.  Psychiatric:        Mood and Affect: Mood normal.        Behavior: Behavior normal.    Assessment/Plan: The patient is scheduled for excision of forehead lipoma with Dr. Claudia Desanctis on 10/04/19.   Rx sent to pharmacy.   The risks that can be encountered with and after surgery for excision of lipoma were discussed and include the following but not limited to these: bleeding, infection, delayed healing, anesthesia risks, skin sensation changes, injury to structures including nerves, blood vessels, and muscles which may be temporary or permanent, allergies to tape, suture materials and glues, blood products, topical preparations or injected agents, skin contour irregularities, skin discoloration and swelling, deep vein thrombosis, cardiac and pulmonary complications, pain, which may persist, persistent pain, recurrence of the lesion, poor healing of the incision, possible need for revisional surgery or staged procedures. Risk of recurrence was explained.   Electronically signed by: Carola Rhine Sion Thane,  PA-C 10/01/2019 11:09 AM

## 2019-10-01 NOTE — H&P (View-Only) (Signed)
Patient ID: Gabrielle Perkins, female    DOB: 09-Jul-1976, 44 y.o.   MRN: [id number]  Chief Complaint  Patient presents with  . Pre-op Exam      ICD-10-CM   1. 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  female  with a history of a mass growing on her upper forehead, she has intermittent pain. She had an [place] 08/23/19 of this area, impression was probable benign scalp lipoma.  She presents for preoperative evaluation for upcoming procedure, excision of forehead scalp lipoma, scheduled for 10/04/19 with Dr. Claudia Desanctis.  The patient has not had problems with anesthesia.  No history of DVT/PE.  No recent colds or fevers.  No nausea or vomiting.  She stopped taking her aspirin 4 days ago.  She reports having a heart cath 2 years ago with no abnormal findings.  No stents were placed.  She has a hx of hypertension, obesity, diabetes. Last A1c 09/10/19 was 6.3.  HTN is well controlled, she reports she did not take her medication yet today. She does not smoke.  Past Medical History: Allergies: No Known Allergies  Current Medications:  Current Outpatient Medications:  .  acetaminophen (TYLENOL) 500 MG tablet, Take 500 mg by mouth every 6 (six) hours as needed., Disp: , Rfl:  .  amLODipine (NORVASC) 5 MG tablet, Take 1 tablet (5 mg total) by mouth daily., Disp: 90 tablet, Rfl: 2 .  aspirin EC 81 MG tablet, Take 1 tablet (81 mg total) by mouth daily., Disp: 90 tablet, Rfl: 2 .  Blood Glucose Monitoring Suppl (FREESTYLE FREEDOM LITE) w/Device KIT, Use as directed to check blood sugars 2 times per day dx: e11.22, Disp: 1 kit, Rfl: 1 .  cetirizine (ZYRTEC) 10 MG tablet, Take 1 tablet (10 mg total) by mouth daily., Disp: 90 tablet, Rfl: 2 .  fluticasone (FLONASE) 50 MCG/ACT nasal spray, Place 2 sprays into both nostrils daily., Disp: 16 g, Rfl: 2 .  glucose blood (FREESTYLE LITE) test strip, Use as instructed to check blood sugars 2 times per day dx: e11.22, Disp:  150 each, Rfl: 2 .  metFORMIN (GLUCOPHAGE-XR) 500 MG 24 hr tablet, Take 2 tablets (1,000 mg total) by mouth daily with supper., Disp: , Rfl:  .  metoprolol succinate (TOPROL XL) 50 MG 24 hr tablet, Take 1 tablet (50 mg total) by mouth daily., Disp: 90 tablet, Rfl: 2 .  Microlet Lancets MISC, Use as directed to check blood sugars 2 times per day dx: e11.22, Disp: 150 each, Rfl: 2 .  omeprazole (PRILOSEC) 40 MG capsule, Take 1 capsule (40 mg total) by mouth daily., Disp: 90 capsule, Rfl: 2 .  Semaglutide,0.25 or 0.5MG/DOS, (OZEMPIC, 0.25 OR 0.5 MG/DOSE,) 2 MG/1.5ML SOPN, Inject 0.5 mg into the skin once a week., Disp: 12 pen, Rfl: 2 .  telmisartan-hydrochlorothiazide (MICARDIS HCT) 80-25 MG tablet, Take 1 tablet by mouth daily., Disp: 90 tablet, Rfl: 2  Past Medical Problems: Past Medical History:  Diagnosis Date  . Diabetes mellitus without complication ([place])   . Hypertension     Past Surgical History: Past Surgical History:  Procedure Laterality Date  . btl    . LEFT HEART CATH AND CORONARY ANGIOGRAPHY N/A 09/12/2017   Procedure: LEFT HEART CATH AND CORONARY ANGIOGRAPHY;  Surgeon: Dixie Dials, MD;  Location: [facility];  Service: Cardiovascular;  Laterality: N/A;    Social History: Social History  Socioeconomic History  . Marital status: Married    Spouse name: Not on file  . Number of children: Not on file  . Years of education: Not on file  . Highest education level: Not on file  Occupational History  . Not on file  Tobacco Use  . Smoking status: Never Smoker  . Smokeless tobacco: Never Used  Substance and Sexual Activity  . Alcohol use: No  . Drug use: Never  . Sexual activity: Not on file  Other Topics Concern  . Not on file  Social History Narrative  . Not on file   Social Determinants of Health   Financial Resource Strain:   . Difficulty of Paying Living Expenses: Not on file  Food Insecurity:   . Worried About Charity fundraiser in the Last Year:  Not on file  . Ran Out of Food in the Last Year: Not on file  Transportation Needs:   . Lack of Transportation (Medical): Not on file  . Lack of Transportation (Non-Medical): Not on file  Physical Activity:   . Days of Exercise per Week: Not on file  . Minutes of Exercise per Session: Not on file  Stress:   . Feeling of Stress : Not on file  Social Connections:   . Frequency of Communication with Friends and Family: Not on file  . Frequency of Social Gatherings with Friends and Family: Not on file  . Attends Religious Services: Not on file  . Active Member of Clubs or Organizations: Not on file  . Attends Archivist Meetings: Not on file  . Marital Status: Not on file  Intimate Partner Violence:   . Fear of Current or Ex-Partner: Not on file  . Emotionally Abused: Not on file  . Physically Abused: Not on file  . Sexually Abused: Not on file    Family History: Family History  Problem Relation Age of Onset  . Coronary artery disease Maternal Grandmother   . Hypertension Mother   . Diabetes Mother   . Hypertension Father   . Diabetes Father   . Sarcoidosis Father   . Breast cancer Neg Hx     Review of Systems: Review of Systems  Constitutional: Negative for chills, fever and malaise/fatigue.  Respiratory: Negative.   Cardiovascular: Negative.   Gastrointestinal: Negative.   Genitourinary: Negative.   Musculoskeletal: Negative.   Neurological: Positive for headaches (Occasional). Negative for dizziness and weakness.    Physical Exam: Vital Signs BP (!) 148/93 (BP Location: Right Arm, Patient Position: Sitting, Cuff Size: Normal)   Pulse 63   Temp 97.9 F (36.6 C) (Temporal)   Ht 5' 4" (1.626 m)   Wt 213 lb (96.6 kg)   LMP 09/03/2019   SpO2 99%   BMI 36.56 kg/m  Physical Exam Exam conducted with a chaperone present.  Constitutional:      General: She is not in acute distress.    Appearance: Normal appearance. She is not ill-appearing.  HENT:      Head: Normocephalic and atraumatic. Forehead mass extending into hairline, ~ 4 cm. Eyes:     Pupils: Pupils are equal, round Neck:     Musculoskeletal: Normal range of motion.  Cardiovascular:     Rate and Rhythm: Normal rate and regular rhythm.     Pulses: Normal pulses.     Heart sounds: Normal heart sounds. No murmur.  Pulmonary:     Effort: Pulmonary effort is normal. No respiratory distress.     Breath sounds:  Normal breath sounds. No wheezing.  Abdominal:     General: Abdomen is flat. There is no distension.     Palpations: Abdomen is soft.     Tenderness: There is no abdominal tenderness.  Musculoskeletal: Normal range of motion.  Skin:    General: Skin is warm and dry.     Findings: No erythema or rash.  Neurological:     General: No focal deficit present.     Mental Status: She is alert and oriented to person, place, and time. Mental status is at baseline.     Motor: No weakness.  Psychiatric:        Mood and Affect: Mood normal.        Behavior: Behavior normal.    Assessment/Plan: The patient is scheduled for excision of forehead lipoma with Dr. Claudia Desanctis on 10/04/19.   Rx sent to pharmacy.   The risks that can be encountered with and after surgery for excision of lipoma were discussed and include the following but not limited to these: bleeding, infection, delayed healing, anesthesia risks, skin sensation changes, injury to structures including nerves, blood vessels, and muscles which may be temporary or permanent, allergies to tape, suture materials and glues, blood products, topical preparations or injected agents, skin contour irregularities, skin discoloration and swelling, deep vein thrombosis, cardiac and pulmonary complications, pain, which may persist, persistent pain, recurrence of the lesion, poor healing of the incision, possible need for revisional surgery or staged procedures. Risk of recurrence was explained.   Electronically signed by: Carola Rhine Elanore Talcott,  PA-C 10/01/2019 11:09 AM

## 2019-10-02 LAB — NOVEL CORONAVIRUS, NAA (HOSP ORDER, SEND-OUT TO REF LAB; TAT 18-24 HRS): SARS-CoV-2, NAA: NOT DETECTED

## 2019-10-03 NOTE — Anesthesia Preprocedure Evaluation (Addendum)
Anesthesia Evaluation  Patient identified by MRN, date of birth, ID band Patient awake    Reviewed: Allergy & Precautions, H&P , NPO status , Patient's Chart, lab work & pertinent test results  Airway Mallampati: II  TM Distance: >3 FB Neck ROM: Full    Dental no notable dental hx. (+) Teeth Intact, Dental Advisory Given   Pulmonary neg pulmonary ROS,    Pulmonary exam normal breath sounds clear to auscultation       Cardiovascular Exercise Tolerance: Good hypertension, Pt. on medications negative cardio ROS Normal cardiovascular exam Rhythm:Regular Rate:Normal  Cath 73' NL  Echo 18' Study Conclusions  - Left ventricle: The cavity size was normal. Wall thickness was   normal. Systolic function was normal. The estimated ejection   fraction was in the range of 60% to 65%. Wall motion was normal;   there were no regional wall motion abnormalities. Left   ventricular diastolic function parameters were normal. - Mitral valve: There was mild regurgitation. - Left atrium: The atrium was mildly dilated. - Tricuspid valve: There was moderate regurgitation.    Neuro/Psych negative neurological ROS  negative psych ROS   GI/Hepatic negative GI ROS, Neg liver ROS,   Endo/Other  negative endocrine ROSdiabetes, Well Controlled  Renal/GU negative Renal ROS  negative genitourinary   Musculoskeletal negative musculoskeletal ROS (+)   Abdominal   Peds negative pediatric ROS (+)  Hematology negative hematology ROS (+)   Anesthesia Other Findings   Reproductive/Obstetrics negative OB ROS                            Anesthesia Physical Anesthesia Plan  ASA: III  Anesthesia Plan: General   Post-op Pain Management:    Induction: Intravenous  PONV Risk Score and Plan: 3 and Ondansetron, Treatment may vary due to age or medical condition and Midazolam  Airway Management Planned: LMA  Additional  Equipment:   Intra-op Plan:   Post-operative Plan:   Informed Consent: I have reviewed the patients History and Physical, chart, labs and discussed the procedure including the risks, benefits and alternatives for the proposed anesthesia with the patient or authorized representative who has indicated his/her understanding and acceptance.       Plan Discussed with: CRNA and Surgeon  Anesthesia Plan Comments: ( )        Anesthesia Quick Evaluation

## 2019-10-04 ENCOUNTER — Ambulatory Visit (HOSPITAL_BASED_OUTPATIENT_CLINIC_OR_DEPARTMENT_OTHER): Admitting: Anesthesiology

## 2019-10-04 ENCOUNTER — Encounter (HOSPITAL_BASED_OUTPATIENT_CLINIC_OR_DEPARTMENT_OTHER): Payer: Self-pay | Admitting: Plastic Surgery

## 2019-10-04 ENCOUNTER — Ambulatory Visit (HOSPITAL_BASED_OUTPATIENT_CLINIC_OR_DEPARTMENT_OTHER)
Admission: RE | Admit: 2019-10-04 | Discharge: 2019-10-04 | Disposition: A | Discharge status: Home / Self Care | Attending: Plastic Surgery | Admitting: Plastic Surgery

## 2019-10-04 ENCOUNTER — Other Ambulatory Visit: Payer: Self-pay

## 2019-10-04 ENCOUNTER — Encounter (HOSPITAL_BASED_OUTPATIENT_CLINIC_OR_DEPARTMENT_OTHER)
Admission: RE | Disposition: A | Discharge status: Home / Self Care | Payer: Self-pay | Source: Home / Self Care | Attending: Plastic Surgery

## 2019-10-04 DIAGNOSIS — E119 Type 2 diabetes mellitus without complications: Secondary | ICD-10-CM | POA: Insufficient documentation

## 2019-10-04 DIAGNOSIS — E669 Obesity, unspecified: Secondary | ICD-10-CM | POA: Insufficient documentation

## 2019-10-04 DIAGNOSIS — Z7984 Long term (current) use of oral hypoglycemic drugs: Secondary | ICD-10-CM | POA: Diagnosis not present

## 2019-10-04 DIAGNOSIS — Z79899 Other long term (current) drug therapy: Secondary | ICD-10-CM | POA: Diagnosis not present

## 2019-10-04 DIAGNOSIS — Z6836 Body mass index (BMI) 36.0-36.9, adult: Secondary | ICD-10-CM | POA: Diagnosis not present

## 2019-10-04 DIAGNOSIS — I10 Essential (primary) hypertension: Secondary | ICD-10-CM | POA: Diagnosis not present

## 2019-10-04 DIAGNOSIS — D170 Benign lipomatous neoplasm of skin and subcutaneous tissue of head, face and neck: Secondary | ICD-10-CM | POA: Insufficient documentation

## 2019-10-04 DIAGNOSIS — Z7982 Long term (current) use of aspirin: Secondary | ICD-10-CM | POA: Insufficient documentation

## 2019-10-04 HISTORY — PX: LIPOMA EXCISION: SHX5283

## 2019-10-04 LAB — POCT PREGNANCY, URINE: Preg Test, Ur: NEGATIVE

## 2019-10-04 LAB — GLUCOSE, CAPILLARY: Glucose-Capillary: 138 mg/dL — ABNORMAL HIGH (ref 70–99)

## 2019-10-04 SURGERY — EXCISION LIPOMA
Anesthesia: General | Site: Face

## 2019-10-04 MED ORDER — CEFAZOLIN SODIUM-DEXTROSE 2-4 GM/100ML-% IV SOLN: 2.0000 g | INTRAVENOUS | Status: DC

## 2019-10-04 MED ORDER — MEPERIDINE HCL 25 MG/ML IJ SOLN: 6.2500 mg | INTRAMUSCULAR | Status: DC | PRN

## 2019-10-04 MED ORDER — CEFAZOLIN SODIUM-DEXTROSE 2-4 GM/100ML-% IV SOLN
INTRAVENOUS | Status: AC
  Filled 2019-10-04: qty 100

## 2019-10-04 MED ORDER — ONDANSETRON HCL 4 MG/2ML IJ SOLN: 4.0000 mg | Freq: Once | INTRAMUSCULAR | Status: DC | PRN

## 2019-10-04 MED ORDER — OXYCODONE HCL 5 MG/5ML PO SOLN: 5.0000 mg | Freq: Once | ORAL | Status: AC | PRN

## 2019-10-04 MED ORDER — LIDOCAINE-EPINEPHRINE 1 %-1:100000 IJ SOLN
INTRAMUSCULAR | Status: AC
  Filled 2019-10-04: qty 2

## 2019-10-04 MED ORDER — LIDOCAINE-EPINEPHRINE 0.5 %-1:200000 IJ SOLN
INTRAMUSCULAR | Status: AC
  Filled 2019-10-04: qty 1

## 2019-10-04 MED ORDER — BACITRACIN ZINC 500 UNIT/GM EX OINT
TOPICAL_OINTMENT | CUTANEOUS | Status: AC
  Filled 2019-10-04: qty 1.8

## 2019-10-04 MED ORDER — OXYCODONE HCL 5 MG PO TABS
ORAL_TABLET | ORAL | Status: AC
  Filled 2019-10-04: qty 1

## 2019-10-04 MED ORDER — LIDOCAINE-EPINEPHRINE 1 %-1:100000 IJ SOLN
INTRAMUSCULAR | Status: AC
  Filled 2019-10-04: qty 1

## 2019-10-04 MED ORDER — FENTANYL CITRATE (PF) 100 MCG/2ML IJ SOLN
INTRAMUSCULAR | Status: DC | PRN
  Administered 2019-10-04: 50 ug via INTRAVENOUS

## 2019-10-04 MED ORDER — DEXAMETHASONE SODIUM PHOSPHATE 4 MG/ML IJ SOLN
INTRAMUSCULAR | Status: DC | PRN
  Administered 2019-10-04: 5 mg via INTRAVENOUS

## 2019-10-04 MED ORDER — BUPIVACAINE HCL (PF) 0.5 % IJ SOLN
INTRAMUSCULAR | Status: AC
  Filled 2019-10-04: qty 30

## 2019-10-04 MED ORDER — LACTATED RINGERS IV SOLN: INTRAVENOUS | Status: DC | PRN

## 2019-10-04 MED ORDER — ACETAMINOPHEN 325 MG PO TABS: 325.0000 mg | ORAL_TABLET | ORAL | Status: DC | PRN

## 2019-10-04 MED ORDER — ACETAMINOPHEN 160 MG/5ML PO SOLN: 325.0000 mg | ORAL | Status: DC | PRN

## 2019-10-04 MED ORDER — OXYCODONE HCL 5 MG PO TABS
5.0000 mg | ORAL_TABLET | Freq: Once | ORAL | Status: AC | PRN
  Administered 2019-10-04: 5 mg via ORAL

## 2019-10-04 MED ORDER — LIDOCAINE HCL (CARDIAC) PF 100 MG/5ML IV SOSY
PREFILLED_SYRINGE | INTRAVENOUS | Status: DC | PRN
  Administered 2019-10-04: 75 mg via INTRAVENOUS

## 2019-10-04 MED ORDER — MIDAZOLAM HCL 2 MG/2ML IJ SOLN
INTRAMUSCULAR | Status: AC
  Filled 2019-10-04: qty 2

## 2019-10-04 MED ORDER — PROPOFOL 10 MG/ML IV BOLUS
INTRAVENOUS | Status: DC | PRN
  Administered 2019-10-04: 200 mg via INTRAVENOUS

## 2019-10-04 MED ORDER — LIDOCAINE HCL (PF) 1 % IJ SOLN
INTRAMUSCULAR | Status: AC
  Filled 2019-10-04: qty 30

## 2019-10-04 MED ORDER — FENTANYL CITRATE (PF) 100 MCG/2ML IJ SOLN
INTRAMUSCULAR | Status: AC
  Filled 2019-10-04: qty 2

## 2019-10-04 MED ORDER — BUPIVACAINE HCL (PF) 0.25 % IJ SOLN
INTRAMUSCULAR | Status: AC
  Filled 2019-10-04: qty 60

## 2019-10-04 MED ORDER — MIDAZOLAM HCL 5 MG/5ML IJ SOLN
INTRAMUSCULAR | Status: DC | PRN
  Administered 2019-10-04: 2 mg via INTRAVENOUS

## 2019-10-04 MED ORDER — LIDOCAINE-EPINEPHRINE 0.5 %-1:200000 IJ SOLN
INTRAMUSCULAR | Status: DC | PRN
  Administered 2019-10-04: 10 mL

## 2019-10-04 MED ORDER — LACTATED RINGERS IV SOLN: INTRAVENOUS | Status: DC

## 2019-10-04 MED ORDER — ONDANSETRON HCL 4 MG/2ML IJ SOLN
INTRAMUSCULAR | Status: DC | PRN
  Administered 2019-10-04: 4 mg via INTRAVENOUS

## 2019-10-04 MED ORDER — CEFAZOLIN SODIUM-DEXTROSE 2-3 GM-%(50ML) IV SOLR
INTRAVENOUS | Status: DC | PRN
  Administered 2019-10-04: 2 g via INTRAVENOUS

## 2019-10-04 MED ORDER — FENTANYL CITRATE (PF) 100 MCG/2ML IJ SOLN: 25.0000 ug | INTRAMUSCULAR | Status: DC | PRN

## 2019-10-04 SURGICAL SUPPLY — 62 items
BAND RUBBER #18 3X1/16 STRL (MISCELLANEOUS) IMPLANT
BENZOIN TINCTURE PRP APPL 2/3 (GAUZE/BANDAGES/DRESSINGS) IMPLANT
BLADE CLIPPER SURG (BLADE) IMPLANT
BLADE SURG 15 STRL LF DISP TIS (BLADE) ×1 IMPLANT
BLADE SURG 15 STRL SS (BLADE) ×2
CANISTER SUCT 1200ML W/VALVE (MISCELLANEOUS) IMPLANT
CHLORAPREP W/TINT 26 (MISCELLANEOUS) ×2 IMPLANT
COVER BACK TABLE 60X90IN (DRAPES) ×2 IMPLANT
COVER MAYO STAND STRL (DRAPES) ×2 IMPLANT
COVER WAND RF STERILE (DRAPES) IMPLANT
DERMABOND ADVANCED (GAUZE/BANDAGES/DRESSINGS)
DERMABOND ADVANCED .7 DNX12 (GAUZE/BANDAGES/DRESSINGS) IMPLANT
DRAIN JP 10F RND SILICONE (MISCELLANEOUS) IMPLANT
DRAPE HALF SHEET 70X43 (DRAPES) IMPLANT
DRAPE LAPAROTOMY 100X72 PEDS (DRAPES) IMPLANT
DRAPE U-SHAPE 76X120 STRL (DRAPES) IMPLANT
DRAPE UTILITY XL STRL (DRAPES) ×2 IMPLANT
DRSG TELFA 3X8 NADH (GAUZE/BANDAGES/DRESSINGS) IMPLANT
ELECT COATED BLADE 2.86 ST (ELECTRODE) IMPLANT
ELECT NEEDLE BLADE 2-5/6 (NEEDLE) ×2 IMPLANT
ELECT REM PT RETURN 9FT ADLT (ELECTROSURGICAL)
ELECT REM PT RETURN 9FT PED (ELECTROSURGICAL)
ELECTRODE REM PT RETRN 9FT PED (ELECTROSURGICAL) IMPLANT
ELECTRODE REM PT RTRN 9FT ADLT (ELECTROSURGICAL) IMPLANT
EVACUATOR SILICONE 100CC (DRAIN) IMPLANT
GAUZE SPONGE 4X4 12PLY STRL LF (GAUZE/BANDAGES/DRESSINGS) IMPLANT
GAUZE XEROFORM 1X8 LF (GAUZE/BANDAGES/DRESSINGS) ×2 IMPLANT
GLOVE BIO SURGEON STRL SZ 6.5 (GLOVE) ×2 IMPLANT
GLOVE BIOGEL M STRL SZ7.5 (GLOVE) ×2 IMPLANT
GLOVE BIOGEL PI IND STRL 6.5 (GLOVE) ×1 IMPLANT
GLOVE BIOGEL PI IND STRL 7.0 (GLOVE) ×1 IMPLANT
GLOVE BIOGEL PI IND STRL 8 (GLOVE) ×2 IMPLANT
GLOVE BIOGEL PI INDICATOR 6.5 (GLOVE) ×1
GLOVE BIOGEL PI INDICATOR 7.0 (GLOVE) ×1
GLOVE BIOGEL PI INDICATOR 8 (GLOVE) ×2
GOWN STRL REUS W/ TWL LRG LVL3 (GOWN DISPOSABLE) ×2 IMPLANT
GOWN STRL REUS W/TWL LRG LVL3 (GOWN DISPOSABLE) ×4
NEEDLE HYPO 30GX1 BEV (NEEDLE) IMPLANT
NEEDLE PRECISIONGLIDE 27X1.5 (NEEDLE) ×2 IMPLANT
NS IRRIG 1000ML POUR BTL (IV SOLUTION) IMPLANT
PACK BASIN DAY SURGERY FS (CUSTOM PROCEDURE TRAY) ×2 IMPLANT
PENCIL SMOKE EVACUATOR (MISCELLANEOUS) ×2 IMPLANT
SLEEVE SCD COMPRESS KNEE MED (MISCELLANEOUS) IMPLANT
SPONGE GAUZE 2X2 8PLY STRL LF (GAUZE/BANDAGES/DRESSINGS) IMPLANT
SPONGE LAP 18X18 RF (DISPOSABLE) IMPLANT
STAPLER VISISTAT 35W (STAPLE) ×2 IMPLANT
STRIP CLOSURE SKIN 1/2X4 (GAUZE/BANDAGES/DRESSINGS) IMPLANT
SUCTION FRAZIER HANDLE 10FR (MISCELLANEOUS)
SUCTION TUBE FRAZIER 10FR DISP (MISCELLANEOUS) IMPLANT
SUT ETHILON 4 0 PS 2 18 (SUTURE) IMPLANT
SUT MNCRL AB 4-0 PS2 18 (SUTURE) IMPLANT
SUT MON AB 5-0 P3 18 (SUTURE) IMPLANT
SUT PLAIN 5 0 P 3 18 (SUTURE) IMPLANT
SUT PROLENE 5 0 P 3 (SUTURE) IMPLANT
SUT VICRYL 4-0 PS2 18IN ABS (SUTURE) IMPLANT
SWAB COLLECTION DEVICE MRSA (MISCELLANEOUS) IMPLANT
SWAB CULTURE ESWAB REG 1ML (MISCELLANEOUS) IMPLANT
SYR BULB 3OZ (MISCELLANEOUS) IMPLANT
SYR CONTROL 10ML LL (SYRINGE) ×2 IMPLANT
TOWEL GREEN STERILE FF (TOWEL DISPOSABLE) ×2 IMPLANT
TRAY DSU PREP LF (CUSTOM PROCEDURE TRAY) IMPLANT
TUBE CONNECTING 20X1/4 (TUBING) IMPLANT

## 2019-10-04 NOTE — Interval H&P Note (Signed)
History and Physical Interval Note:  10/04/2019 7:17 AM  Gabrielle Perkins  has presented today for surgery, with the diagnosis of lipoma of forehead.  The various methods of treatment have been discussed with the patient and family. After consideration of risks, benefits and other options for treatment, the patient has consented to  Procedure(s) with comments: EXCISION OF FOREHEAD LIPOMA (N/A) - 45 min, please as a surgical intervention.  The patient's history has been reviewed, patient examined, no change in status, stable for surgery.  I have reviewed the patient's chart and labs.  Questions were answered to the patient's satisfaction.     Cindra Presume

## 2019-10-04 NOTE — Discharge Instructions (Signed)
Activity as tolerated Can shower regularly starting tomorrow Avoid strenuous activity  Diet: Regular  Wound Care: Can cover the wound as needed for drainage.  Does not have to be covered all the time.  Call doctor if any unusual problems occur such as pain, excessive bleeding, unrelieved nausea/vomiting, fever &/or chills  Follow-up appointment: Scheduled for 1-2 weeks.     Post Anesthesia Home Care Instructions  Activity: Get plenty of rest for the remainder of the day. A responsible individual must stay with you for 24 hours following the procedure.  For the next 24 hours, DO NOT: -Drive a car -Paediatric nurse -Drink alcoholic beverages -Take any medication unless instructed by your physician -Make any legal decisions or sign important papers.  Meals: Start with liquid foods such as gelatin or soup. Progress to regular foods as tolerated. Avoid greasy, spicy, heavy foods. If nausea and/or vomiting occur, drink only clear liquids until the nausea and/or vomiting subsides. Call your physician if vomiting continues.  Special Instructions/Symptoms: Your throat may feel dry or sore from the anesthesia or the breathing tube placed in your throat during surgery. If this causes discomfort, gargle with warm salt water. The discomfort should disappear within 24 hours.  If you had a scopolamine patch placed behind your ear for the management of post- operative nausea and/or vomiting:  1. The medication in the patch is effective for 72 hours, after which it should be removed.  Wrap patch in a tissue and discard in the trash. Wash hands thoroughly with soap and water. 2. You may remove the patch earlier than 72 hours if you experience unpleasant side effects which may include dry mouth, dizziness or visual disturbances. 3. Avoid touching the patch. Wash your hands with soap and water after contact with the patch.

## 2019-10-04 NOTE — Op Note (Signed)
Operative Note   DATE OF OPERATION: 10/04/2019  SURGICAL DEPARTMENT: Plastic Surgery  PREOPERATIVE DIAGNOSES:  Forehead Mass  POSTOPERATIVE DIAGNOSES:  same  PROCEDURE: 1.  Excision of forehead mass 4.5 cm 2.  Complex closure of the forehead 4.5 cm  SURGEON: Talmadge Coventry, MD  ASSISTANT: Misty Stanley, PA The advanced practice practitioner (APP) assisted throughout the case.  The APP was essential in retraction and counter traction when needed to make the case progress smoothly.  This retraction and assistance made it possible to see the tissue plans for the procedure.  The assistance was needed for blood control, tissue re-approximation and assisted with closure of the incision site.  ANESTHESIA:  General.   COMPLICATIONS: None.   INDICATIONS FOR PROCEDURE:  The patient, Gabrielle Perkins is a 44 y.o. female born on 06-Apr-1976, is here for treatment of forehead mass MRN: [id number]  CONSENT:  Informed consent was obtained directly from the patient. Risks, benefits and alternatives were fully discussed. Specific risks including but not limited to bleeding, infection, hematoma, seroma, scarring, pain, contracture, asymmetry, wound healing problems, and need for further surgery were all discussed. The patient did have an ample opportunity to have questions answered to satisfaction.   DESCRIPTION OF PROCEDURE:  The patient was taken to the operating room. SCDs were placed and Ancef antibiotics were given.  General anesthesia was administered.  The patient's operative site was prepped and draped in a sterile fashion. A time out was performed and all information was confirmed to be correct.  1% lidocaine with epinephrine was injected circumferentially.  Transverse incision was made behind the hairline with a 15 blade and cautery was used to dissect down to the mass that appeared to be a lipoma.  Tenotomy dissection was used to free it up from the surrounding tissues and it was excised en  bloc.  Hemostasis was obtained with cautery.  Surrounding skin was undermined as needed and advanced and closed in layers with interrupted buried 4-0 Monocryl sutures.  A running Monocryl was then done for the skin and covered with a soft dressing.  The patient tolerated the procedure well.  There were no complications. The patient was allowed to wake from anesthesia, extubated and taken to the recovery room in satisfactory condition.

## 2019-10-04 NOTE — Brief Op Note (Signed)
10/04/2019  8:20 AM  PATIENT:  Gabrielle Perkins  44 y.o. female  PRE-OPERATIVE DIAGNOSIS:  lipoma of forehead  POST-OPERATIVE DIAGNOSIS:  lipoma of forehead  PROCEDURE:  Procedure(s) with comments: EXCISION OF FOREHEAD LIPOMA (N/A) - 45 min, please  SURGEON:  Surgeon(s) and Role:    * Nikeia Henkes, Steffanie Dunn, MD - Primary  PHYSICIAN ASSISTANT: Software engineer, PA  ASSISTANTS: none   ANESTHESIA:   general  EBL:  10   BLOOD ADMINISTERED:none  DRAINS: none   LOCAL MEDICATIONS USED:  LIDOCAINE   SPECIMEN:  Source of Specimen:  scalp lipoma  DISPOSITION OF SPECIMEN:  PATHOLOGY  COUNTS:  YES  TOURNIQUET:  * No tourniquets in log *  DICTATION: .Dragon Dictation  PLAN OF CARE: Discharge to home after PACU  PATIENT DISPOSITION:  PACU - hemodynamically stable.   Delay start of Pharmacological VTE agent (>24hrs) due to surgical blood loss or risk of bleeding: not applicable

## 2019-10-04 NOTE — Transfer of Care (Signed)
Immediate Anesthesia Transfer of Care Note  Patient: Gabrielle Perkins  Procedure(s) Performed: EXCISION OF FOREHEAD LIPOMA (N/A Face)  Patient Location: PACU  Anesthesia Type:General  Level of Consciousness: awake, alert , oriented and drowsy  Airway & Oxygen Therapy: Patient Spontanous Breathing and Patient connected to face mask oxygen  Post-op Assessment: Report given to RN and Post -op Vital signs reviewed and stable  Post vital signs: Reviewed and stable  Last Vitals:  Vitals Value Taken Time  BP 120/82 10/04/19 0832  Temp    Pulse 73 10/04/19 0834  Resp 12 10/04/19 0834  SpO2 100 % 10/04/19 0834  Vitals shown include unvalidated device data.  Last Pain:  Vitals:   10/04/19 0711  TempSrc:   PainSc: 0-No pain      Patients Stated Pain Goal: 3 ([id number])  Complications: No apparent anesthesia complications

## 2019-10-04 NOTE — Anesthesia Procedure Notes (Signed)
Procedure Name: LMA Insertion Date/Time: 10/04/2019 7:35 AM Performed by: Willa Frater, CRNA Pre-anesthesia Checklist: Patient identified, Emergency Drugs available, Suction available and Patient being monitored Patient Re-evaluated:Patient Re-evaluated prior to induction Oxygen Delivery Method: Circle system utilized Preoxygenation: Pre-oxygenation with 100% oxygen Induction Type: IV induction Ventilation: Mask ventilation without difficulty LMA: LMA inserted LMA Size: 4.0 Number of attempts: 1 Airway Equipment and Method: Bite block Placement Confirmation: positive ETCO2 Tube secured with: Tape Dental Injury: Teeth and Oropharynx as per pre-operative assessment

## 2019-10-04 NOTE — Anesthesia Postprocedure Evaluation (Signed)
Anesthesia Post Note  Patient: Gabrielle Perkins  Procedure(s) Performed: EXCISION OF FOREHEAD LIPOMA (N/A Face)     Patient location during evaluation: PACU Anesthesia Type: General Level of consciousness: awake and alert Pain management: pain level controlled Vital Signs Assessment: post-procedure vital signs reviewed and stable Respiratory status: spontaneous breathing, nonlabored ventilation, respiratory function stable and patient connected to nasal cannula oxygen Cardiovascular status: blood pressure returned to baseline and stable Postop Assessment: no apparent nausea or vomiting Anesthetic complications: no    Last Vitals:  Vitals:   10/04/19 0845 10/04/19 0900  BP: 127/82 118/78  Pulse: 64 65  Resp: 12 16  Temp:  36.8 C  SpO2: 97% 97%    Last Pain:  Vitals:   10/04/19 0930  TempSrc:   PainSc: 2                  Yoshika Vensel

## 2019-10-05 ENCOUNTER — Encounter: Payer: Self-pay | Admitting: *Deleted

## 2019-10-05 LAB — SURGICAL PATHOLOGY

## 2019-10-11 ENCOUNTER — Other Ambulatory Visit: Payer: Self-pay

## 2019-10-11 ENCOUNTER — Encounter: Payer: Self-pay | Admitting: Surgical

## 2019-10-11 ENCOUNTER — Ambulatory Visit (INDEPENDENT_AMBULATORY_CARE_PROVIDER_SITE_OTHER): Admitting: Surgical

## 2019-10-11 VITALS — BP 134/88 | HR 69 | Temp 97.7°F | Ht 64.0 in | Wt 215.0 lb

## 2019-10-11 DIAGNOSIS — D170 Benign lipomatous neoplasm of skin and subcutaneous tissue of head, face and neck: Secondary | ICD-10-CM

## 2019-10-11 NOTE — Progress Notes (Signed)
Gabrielle Perkins is a 44 year old female here for follow-up after excision of forehead mass with Dr. Claudia Desanctis.  She is 1 week postop today.  Pathology showed lipoma.  She is doing well.  Her incision is healing well.  There is some scabbing, no drainage noted.  She has a little bit of a fluid collection, but it is not tight or tender.  She has not had any fevers, chills, nausea, vomiting.  She does have a little bit of numbness along the superior aspect of her forehead.  There is no periwound erythema.  Monocryl sutures removed.   Call with any questions or concerns, follow-up as needed.

## 2019-12-07 ENCOUNTER — Ambulatory Visit: Attending: Internal Medicine

## 2019-12-07 DIAGNOSIS — Z23 Encounter for immunization: Secondary | ICD-10-CM

## 2019-12-07 NOTE — Progress Notes (Signed)
   Covid-19 Vaccination Clinic  Name:  Gabrielle Perkins    MRN: [id number] DOB: December 20, 1975  12/07/2019  [name] was observed post Covid-19 immunization for 15 minutes without incident. She was provided with Vaccine Information Sheet and instruction to access the V-Safe system.   [name] was instructed to call 911 with any severe reactions post vaccine: Marland Kitchen Difficulty breathing  . Swelling of face and throat  . A fast heartbeat  . A bad rash all over body  . Dizziness and weakness   Immunizations Administered    Name Date Dose VIS Date Route   Pfizer COVID-19 Vaccine 12/07/2019  9:26 AM 0.3 mL 08/31/2019 Intramuscular   Manufacturer: [place]   Lot: [id number]   [place]: [id number]

## 2020-01-01 ENCOUNTER — Ambulatory Visit: Attending: Internal Medicine

## 2020-01-01 DIAGNOSIS — Z23 Encounter for immunization: Secondary | ICD-10-CM

## 2020-01-01 NOTE — Progress Notes (Signed)
   Covid-19 Vaccination Clinic  Name:  Gabrielle Perkins    MRN: [id number] DOB: 03-Mar-1976  01/01/2020  [name] was observed post Covid-19 immunization for 15 minutes without incident. She was provided with Vaccine Information Sheet and instruction to access the V-Safe system.   [name] was instructed to call 911 with any severe reactions post vaccine: Marland Kitchen Difficulty breathing  . Swelling of face and throat  . A fast heartbeat  . A bad rash all over body  . Dizziness and weakness   Immunizations Administered    Name Date Dose VIS Date Route   Pfizer COVID-19 Vaccine 01/01/2020 10:24 AM 0.3 mL 08/31/2019 Intramuscular   Manufacturer: [place]   Lot: [id number]   [place]: [id number]

## 2020-01-15 ENCOUNTER — Encounter: Payer: Self-pay | Admitting: Internal Medicine

## 2020-01-15 ENCOUNTER — Ambulatory Visit (INDEPENDENT_AMBULATORY_CARE_PROVIDER_SITE_OTHER): Admitting: Internal Medicine

## 2020-01-15 ENCOUNTER — Other Ambulatory Visit: Payer: Self-pay

## 2020-01-15 VITALS — BP 132/96 | HR 75 | Temp 98.1°F | Ht 64.0 in | Wt 219.0 lb

## 2020-01-15 DIAGNOSIS — I10 Essential (primary) hypertension: Secondary | ICD-10-CM | POA: Diagnosis not present

## 2020-01-15 DIAGNOSIS — E1165 Type 2 diabetes mellitus with hyperglycemia: Secondary | ICD-10-CM | POA: Diagnosis not present

## 2020-01-15 DIAGNOSIS — E7800 Pure hypercholesterolemia, unspecified: Secondary | ICD-10-CM | POA: Diagnosis not present

## 2020-01-15 DIAGNOSIS — Z6837 Body mass index (BMI) 37.0-37.9, adult: Secondary | ICD-10-CM

## 2020-01-15 NOTE — Progress Notes (Signed)
This visit occurred during the SARS-CoV-2 public health emergency.  Safety protocols were in place, including screening questions prior to the visit, additional usage of staff PPE, and extensive cleaning of exam room while observing appropriate contact time as indicated for disinfecting solutions.  Subjective:     Patient ID: Gabrielle Perkins , female    DOB: [birth year] , 44 y.o.   MRN: [id number]   Chief Complaint  Patient presents with  . Diabetes  . Hypertension    HPI  She presents today for diabetes check. She reports she is no longer taking Ozempic b/c of the cost. She states her sugar readings are 100-120s.   Diabetes She presents for her follow-up diabetic visit. She has type 2 diabetes mellitus. There are no hypoglycemic associated symptoms. There are no diabetic associated symptoms. There are no hypoglycemic complications. Risk factors for coronary artery disease include diabetes mellitus, dyslipidemia, hypertension, obesity and sedentary lifestyle. Current diabetic treatment includes oral agent (monotherapy). She is compliant with treatment some of the time. She is following a generally healthy diet. She participates in exercise intermittently. Her breakfast blood glucose is taken between 8-9 am. Her breakfast blood glucose range is generally 90-110 mg/dl. Eye exam is current.  Hypertension This is a chronic problem. The current episode started more than 1 year ago. The problem has been gradually improving since onset. The problem is controlled. Past treatments include angiotensin blockers and diuretics. The current treatment provides moderate improvement. Compliance problems include exercise.      Past Medical History:  Diagnosis Date  . Diabetes mellitus without complication (Time)   . Hypertension      Family History  Problem Relation Age of Onset  . Coronary artery disease Maternal Grandmother   . Hypertension Mother   . Diabetes Mother   . Hypertension Father   .  Diabetes Father   . Sarcoidosis Father   . Breast cancer Neg Hx      Current Outpatient Medications:  .  acetaminophen (TYLENOL) 500 MG tablet, Take 500 mg by mouth every 6 (six) hours as needed., Disp: , Rfl:  .  amLODipine (NORVASC) 5 MG tablet, Take 1 tablet (5 mg total) by mouth daily., Disp: 90 tablet, Rfl: 2 .  aspirin EC 81 MG tablet, Take 1 tablet (81 mg total) by mouth daily., Disp: 90 tablet, Rfl: 2 .  Blood Glucose Monitoring Suppl (FREESTYLE FREEDOM LITE) w/Device KIT, Use as directed to check blood sugars 2 times per day dx: e11.22, Disp: 1 kit, Rfl: 1 .  cetirizine (ZYRTEC) 10 MG tablet, Take 1 tablet (10 mg total) by mouth daily., Disp: 90 tablet, Rfl: 2 .  fluticasone (FLONASE) 50 MCG/ACT nasal spray, Place 2 sprays into both nostrils daily., Disp: 16 g, Rfl: 2 .  glucose blood (FREESTYLE LITE) test strip, Use as instructed to check blood sugars 2 times per day dx: e11.22, Disp: 150 each, Rfl: 2 .  metFORMIN (GLUCOPHAGE-XR) 500 MG 24 hr tablet, Take 2 tablets (1,000 mg total) by mouth daily with supper., Disp: , Rfl:  .  metoprolol succinate (TOPROL XL) 50 MG 24 hr tablet, Take 1 tablet (50 mg total) by mouth daily., Disp: 90 tablet, Rfl: 2 .  Microlet Lancets MISC, Use as directed to check blood sugars 2 times per day dx: e11.22, Disp: 150 each, Rfl: 2 .  omeprazole (PRILOSEC) 40 MG capsule, Take 1 capsule (40 mg total) by mouth daily., Disp: 90 capsule, Rfl: 2 .  telmisartan-hydrochlorothiazide (MICARDIS HCT) 80-25 MG tablet,  Take 1 tablet by mouth daily., Disp: 90 tablet, Rfl: 2   No Known Allergies   Review of Systems  Constitutional: Negative.   Respiratory: Negative.   Cardiovascular: Negative.   Gastrointestinal: Negative.   Neurological: Negative.   Psychiatric/Behavioral: Negative.      Today's Vitals   01/15/20 0920  BP: (!) 132/96  Pulse: 75  Temp: 98.1 F (36.7 C)  TempSrc: Oral  Weight: 219 lb (99.3 kg)  Height: '5\' 4"'  (1.626 m)   Body mass index  is 37.59 kg/m.   Wt Readings from Last 3 Encounters:  01/15/20 219 lb (99.3 kg)  10/11/19 215 lb (97.5 kg)  10/04/19 214 lb 1.1 oz (97.1 kg)    Objective:  Physical Exam Vitals and nursing note reviewed.  Constitutional:      Appearance: Normal appearance. She is obese.  HENT:     Head: Normocephalic and atraumatic.  Cardiovascular:     Rate and Rhythm: Normal rate and regular rhythm.     Heart sounds: Normal heart sounds.  Pulmonary:     Effort: Pulmonary effort is normal.     Breath sounds: Normal breath sounds.  Skin:    General: Skin is warm.  Neurological:     General: No focal deficit present.     Mental Status: She is alert.  Psychiatric:        Mood and Affect: Mood normal.        Behavior: Behavior normal.         Assessment And Plan:     1. Uncontrolled type 2 diabetes mellitus with hyperglycemia (HCC)  Chronic, I will check labs as listed below. She is encouraged to incorporate more exercise into her daily routine.   - Comprehensive metabolic panel - Hemoglobin A1c  2. Essential hypertension, benign  Chronic, fair control. She will continue with current meds. She is encouraged to avoid adding salt to her foods. Also advised to exercise at least 150 minutes per week.   3. Pure hypercholesterolemia  Chronic, I will check fasting lipid panel today. LDL goal is less than 70.   4. Class 2 severe obesity due to excess calories with serious comorbidity and body mass index (BMI) of 37.0 to 37.9 in adult [facility])  She is encouraged to strive for BMI less than 30 to decrease cardiac risk. She is encouraged to avoid sugary beverages and processed foods.    [name], MD    THE PATIENT IS ENCOURAGED TO PRACTICE SOCIAL DISTANCING DUE TO THE COVID-19 PANDEMIC.

## 2020-01-15 NOTE — Patient Instructions (Signed)

## 2020-01-16 LAB — COMPREHENSIVE METABOLIC PANEL
ALT: 24 IU/L (ref 0–32)
AST: 27 IU/L (ref 0–40)
Albumin/Globulin Ratio: 1.5 (ref 1.2–2.2)
Albumin: 4.1 g/dL (ref 3.8–4.8)
Alkaline Phosphatase: 107 IU/L (ref 39–117)
BUN/Creatinine Ratio: 10 (ref 9–23)
BUN: 7 mg/dL (ref 6–24)
Bilirubin Total: <0.2 mg/dL (ref 0.0–1.2)
CO2: 23 mmol/L (ref 20–29)
Calcium: 9.2 mg/dL (ref 8.7–10.2)
Chloride: 104 mmol/L (ref 96–106)
Creatinine, Ser: 0.73 mg/dL (ref 0.57–1.00)
GFR calc Af Amer: 117 mL/min/{1.73_m2} (ref >59)
GFR calc non Af Amer: 102 mL/min/{1.73_m2} (ref >59)
Globulin, Total: 2.8 g/dL (ref 1.5–4.5)
Glucose: 113 mg/dL — ABNORMAL HIGH (ref 65–99)
Potassium: 3.8 mmol/L (ref 3.5–5.2)
Sodium: 140 mmol/L (ref 134–144)
Total Protein: 6.9 g/dL (ref 6.0–8.5)

## 2020-01-16 LAB — LIPID PANEL WITH LDL/HDL RATIO
Cholesterol, Total: 123 mg/dL (ref 100–199)
HDL: 37 mg/dL — ABNORMAL LOW (ref >39)
LDL Chol Calc (NIH): 71 mg/dL (ref 0–99)
LDL/HDL Ratio: 1.9 ratio (ref 0.0–3.2)
Triglycerides: 73 mg/dL (ref 0–149)
VLDL Cholesterol Cal: 15 mg/dL (ref 5–40)

## 2020-01-16 LAB — SPECIMEN STATUS REPORT

## 2020-01-16 LAB — HEMOGLOBIN A1C
Est. average glucose Bld gHb Est-mCnc: 126 mg/dL
Hgb A1c MFr Bld: 6 % — ABNORMAL HIGH (ref 4.8–5.6)

## 2020-01-16 LAB — HM PAP SMEAR

## 2020-03-10 ENCOUNTER — Other Ambulatory Visit: Payer: Self-pay

## 2020-03-10 MED ORDER — CETIRIZINE HCL 10 MG PO TABS: 10.0000 mg | ORAL_TABLET | Freq: Every day | ORAL | 2 refills | Status: DC

## 2020-03-10 MED ORDER — ASPIRIN EC 81 MG PO TBEC: 81.0000 mg | DELAYED_RELEASE_TABLET | Freq: Every day | ORAL | 2 refills | Status: DC

## 2020-03-10 MED ORDER — METFORMIN HCL ER 500 MG PO TB24: 1000.0000 mg | ORAL_TABLET | Freq: Every day | ORAL | 1 refills | Status: DC

## 2020-05-12 ENCOUNTER — Encounter: Admitting: Internal Medicine

## 2020-06-05 ENCOUNTER — Other Ambulatory Visit: Payer: Self-pay | Admitting: Internal Medicine

## 2020-06-05 DIAGNOSIS — Z1231 Encounter for screening mammogram for malignant neoplasm of breast: Secondary | ICD-10-CM

## 2020-06-26 ENCOUNTER — Ambulatory Visit
Admission: RE | Admit: 2020-06-26 | Discharge: 2020-06-26 | Disposition: A | Discharge status: Home / Self Care | Source: Ambulatory Visit | Attending: Internal Medicine | Admitting: Internal Medicine

## 2020-06-26 ENCOUNTER — Other Ambulatory Visit: Payer: Self-pay

## 2020-06-26 DIAGNOSIS — Z1231 Encounter for screening mammogram for malignant neoplasm of breast: Secondary | ICD-10-CM

## 2020-07-01 ENCOUNTER — Encounter: Admitting: Nurse Practitioner

## 2020-10-13 ENCOUNTER — Other Ambulatory Visit: Payer: Self-pay

## 2020-10-13 ENCOUNTER — Ambulatory Visit (INDEPENDENT_AMBULATORY_CARE_PROVIDER_SITE_OTHER): Admitting: Internal Medicine

## 2020-10-13 ENCOUNTER — Encounter: Payer: Self-pay | Admitting: Internal Medicine

## 2020-10-13 VITALS — BP 122/80 | HR 72 | Temp 98.3°F | Ht 63.4 in | Wt 209.8 lb

## 2020-10-13 DIAGNOSIS — E1165 Type 2 diabetes mellitus with hyperglycemia: Secondary | ICD-10-CM

## 2020-10-13 DIAGNOSIS — Z0000 Encounter for general adult medical examination without abnormal findings: Secondary | ICD-10-CM

## 2020-10-13 DIAGNOSIS — E7800 Pure hypercholesterolemia, unspecified: Secondary | ICD-10-CM

## 2020-10-13 DIAGNOSIS — I10 Essential (primary) hypertension: Secondary | ICD-10-CM

## 2020-10-13 DIAGNOSIS — Z6836 Body mass index (BMI) 36.0-36.9, adult: Secondary | ICD-10-CM

## 2020-10-13 LAB — POCT URINALYSIS DIPSTICK
Bilirubin, UA: NEGATIVE
Blood, UA: NEGATIVE
Glucose, UA: NEGATIVE
Leukocytes, UA: NEGATIVE
Nitrite, UA: NEGATIVE
Protein, UA: NEGATIVE
Spec Grav, UA: 1.015 (ref 1.010–1.025)
Urobilinogen, UA: 0.2 E.U./dL
pH, UA: 6 (ref 5.0–8.0)

## 2020-10-13 LAB — POCT UA - MICROALBUMIN
Albumin/Creatinine Ratio, Urine, POC: <30
Creatinine, POC: 100 mg/dL
Microalbumin Ur, POC: 30 mg/L

## 2020-10-13 MED ORDER — TELMISARTAN-HCTZ 80-25 MG PO TABS: 1.0000 | ORAL_TABLET | Freq: Every day | ORAL | 2 refills | Status: DC

## 2020-10-13 MED ORDER — METOPROLOL SUCCINATE ER 50 MG PO TB24: 50.0000 mg | ORAL_TABLET | Freq: Every day | ORAL | 2 refills | Status: DC

## 2020-10-13 MED ORDER — ASPIRIN EC 81 MG PO TBEC: 81.0000 mg | DELAYED_RELEASE_TABLET | Freq: Every day | ORAL | 2 refills | Status: DC

## 2020-10-13 MED ORDER — AMLODIPINE BESYLATE 5 MG PO TABS: 5.0000 mg | ORAL_TABLET | Freq: Every day | ORAL | 2 refills | Status: DC

## 2020-10-13 MED ORDER — OMEPRAZOLE 40 MG PO CPDR: 40.0000 mg | DELAYED_RELEASE_CAPSULE | Freq: Every day | ORAL | 2 refills | Status: DC

## 2020-10-13 MED ORDER — METFORMIN HCL ER 500 MG PO TB24: 1000.0000 mg | ORAL_TABLET | Freq: Every day | ORAL | 2 refills | Status: DC

## 2020-10-13 MED ORDER — FLUTICASONE PROPIONATE 50 MCG/ACT NA SUSP: 2.0000 | Freq: Every day | NASAL | 2 refills | Status: DC

## 2020-10-13 MED ORDER — CETIRIZINE HCL 10 MG PO TABS: 10.0000 mg | ORAL_TABLET | Freq: Every day | ORAL | 2 refills | Status: DC

## 2020-10-13 NOTE — Progress Notes (Signed)
I,[name],acting as a Education administrator for Gabrielle Greenland, MD.,have documented all relevant documentation on the behalf of Gabrielle Greenland, MD,as directed by  Gabrielle Greenland, MD while in the presence of Gabrielle Greenland, MD.  This visit occurred during the SARS-CoV-2 public health emergency.  Safety protocols were in place, including screening questions prior to the visit, additional usage of staff PPE, and extensive cleaning of exam room while observing appropriate contact time as indicated for disinfecting solutions.  Subjective:     Patient ID: Gabrielle Perkins , female    DOB: 05-23-1976 , 45 y.o.   MRN: [id number]   Chief Complaint  Patient presents with  . Annual Exam  . Diabetes  . Hypertension    HPI  She is here today for a full physical examination.  She is followed by Dr. Cletis Media for her pelvic exams. She denies headaches, chest pain and shortness of breath. She has no specific concerns or complaints at this time.   Diabetes She presents for her follow-up diabetic visit. She has type 2 diabetes mellitus. There are no hypoglycemic associated symptoms. There are no diabetic associated symptoms. There are no hypoglycemic complications. Risk factors for coronary artery disease include diabetes mellitus, dyslipidemia, hypertension, obesity and sedentary lifestyle. She participates in exercise intermittently. Eye exam is current.  Hypertension This is a chronic problem. The current episode started more than 1 year ago. The problem has been gradually improving since onset. The problem is controlled. Risk factors for coronary artery disease include diabetes mellitus, obesity and sedentary lifestyle. Past treatments include angiotensin blockers and diuretics. The current treatment provides moderate improvement. Compliance problems include exercise.      Past Medical History:  Diagnosis Date  . Diabetes mellitus without complication ([place])   . GERD (gastroesophageal reflux disease)   .  Hypertension      Family History  Problem Relation Age of Onset  . Coronary artery disease Maternal Grandmother   . Hypertension Mother   . Diabetes Mother   . Hypertension Father   . Diabetes Father   . Sarcoidosis Father   . Breast cancer Neg Hx      Current Outpatient Medications:  .  acetaminophen (TYLENOL) 500 MG tablet, Take 500 mg by mouth every 6 (six) hours as needed., Disp: , Rfl:  .  amLODipine (NORVASC) 5 MG tablet, Take 1 tablet (5 mg total) by mouth daily., Disp: 90 tablet, Rfl: 2 .  aspirin EC 81 MG tablet, Take 1 tablet (81 mg total) by mouth daily., Disp: 90 tablet, Rfl: 2 .  Blood Glucose Monitoring Suppl (FREESTYLE FREEDOM LITE) w/Device KIT, Use as directed to check blood sugars 2 times per day dx: e11.22, Disp: 1 kit, Rfl: 1 .  cetirizine (ZYRTEC) 10 MG tablet, Take 1 tablet (10 mg total) by mouth daily., Disp: 90 tablet, Rfl: 2 .  fluticasone (FLONASE) 50 MCG/ACT nasal spray, Place 2 sprays into both nostrils daily., Disp: 16 g, Rfl: 2 .  glucose blood (FREESTYLE LITE) test strip, Use as instructed to check blood sugars 2 times per day dx: e11.22, Disp: 150 each, Rfl: 2 .  Iron-FA-B Cmp-C-Biot-Probiotic (FUSION PLUS) CAPS, One capsule po qd, Disp: 30 capsule, Rfl: 3 .  lidocaine (XYLOCAINE) 2 % solution, Use as directed 15 mLs in the mouth or throat as needed for mouth pain., Disp: 100 mL, Rfl: 0 .  metFORMIN (GLUCOPHAGE-XR) 500 MG 24 hr tablet, Take 2 tablets (1,000 mg total) by mouth daily with supper., Disp: 90  tablet, Rfl: 2 .  metoprolol succinate (TOPROL XL) 50 MG 24 hr tablet, Take 1 tablet (50 mg total) by mouth daily., Disp: 90 tablet, Rfl: 2 .  Microlet Lancets MISC, Use as directed to check blood sugars 2 times per day dx: e11.22, Disp: 150 each, Rfl: 2 .  omeprazole (PRILOSEC) 40 MG capsule, Take 1 capsule (40 mg total) by mouth daily., Disp: 90 capsule, Rfl: 2 .  telmisartan-hydrochlorothiazide (MICARDIS HCT) 80-25 MG tablet, Take 1 tablet by mouth  daily., Disp: 90 tablet, Rfl: 2   No Known Allergies    The patient states she uses none for birth control. Last LMP was Patient's last menstrual period was 09/28/2020.. Negative for Dysmenorrhea. Negative for: breast discharge, breast lump(s), breast pain and breast self exam. Associated symptoms include abnormal vaginal bleeding. Pertinent negatives include abnormal bleeding (hematology), anxiety, decreased libido, depression, difficulty falling sleep, dyspareunia, history of infertility, nocturia, sexual dysfunction, sleep disturbances, urinary incontinence, urinary urgency, vaginal discharge and vaginal itching. Diet regular.The patient states her exercise level is  intermittent.   . The patient's tobacco use is:  Social History   Tobacco Use  Smoking Status Never Smoker  Smokeless Tobacco Never Used  . She has been exposed to passive smoke. The patient's alcohol use is:  Social History   Substance and Sexual Activity  Alcohol Use No    Review of Systems  Constitutional: Negative.   HENT: Negative.   Eyes: Negative.   Respiratory: Negative.   Cardiovascular: Negative.   Gastrointestinal: Negative.   Endocrine: Negative.   Genitourinary: Negative.   Musculoskeletal: Negative.   Skin: Negative.   Allergic/Immunologic: Negative.   Neurological: Negative.   Hematological: Negative.   Psychiatric/Behavioral: Negative.      Today's Vitals   10/13/20 1133  BP: 122/80  Pulse: 72  Temp: 98.3 F (36.8 C)  TempSrc: Oral  Weight: 209 lb 12.8 oz (95.2 kg)  Height: 5' 3.4" (1.61 m)   Body mass index is 36.7 kg/m.  Wt Readings from Last 3 Encounters:  10/13/20 209 lb 12.8 oz (95.2 kg)  01/15/20 219 lb (99.3 kg)  10/11/19 215 lb (97.5 kg)   Objective:  Physical Exam Vitals and nursing note reviewed.  Constitutional:      General: She is not in acute distress.    Appearance: Normal appearance. She is well-developed. She is obese.  HENT:     Head: Normocephalic and  atraumatic.     Right Ear: Hearing, tympanic membrane, ear canal and external ear normal. There is no impacted cerumen.     Left Ear: Hearing, tympanic membrane, ear canal and external ear normal. There is no impacted cerumen.     Nose:     Comments: Masked      Mouth/Throat:     Comments: Masked  Eyes:     General: Lids are normal.     Extraocular Movements: Extraocular movements intact.     Conjunctiva/sclera: Conjunctivae normal.  Neck:     Thyroid: No thyroid mass.     Vascular: No carotid bruit.  Cardiovascular:     Rate and Rhythm: Normal rate and regular rhythm.     Pulses: Normal pulses.     Heart sounds: Normal heart sounds. No murmur heard.   Pulmonary:     Effort: Pulmonary effort is normal.     Breath sounds: Normal breath sounds.  Chest:  Breasts:     Tanner Score is 5.     Right: Normal.     Left:  Normal.    Abdominal:     General: Bowel sounds are normal. There is no distension.     Palpations: Abdomen is soft.     Tenderness: There is no abdominal tenderness.     Comments: Obese, soft.   Genitourinary:    Comments: Deferred  Musculoskeletal:        General: No swelling. Normal range of motion.     Cervical back: Full passive range of motion without pain, normal range of motion and neck supple.     Right lower leg: No edema.     Left lower leg: No edema.  Feet:     Right foot:     Protective Sensation: 5 sites tested. 5 sites sensed.     Skin integrity: Dry skin present.     Toenail Condition: Right toenails are normal.     Left foot:     Protective Sensation: 5 sites tested. 5 sites sensed.     Skin integrity: Dry skin present.     Toenail Condition: Left toenails are normal.  Skin:    General: Skin is warm and dry.     Capillary Refill: Capillary refill takes less than 2 seconds.  Neurological:     General: No focal deficit present.     Mental Status: She is alert and oriented to person, place, and time.     Cranial Nerves: No cranial nerve  deficit.     Sensory: No sensory deficit.  Psychiatric:        Mood and Affect: Mood normal.        Behavior: Behavior normal.        Thought Content: Thought content normal.        Judgment: Judgment normal.         Assessment And Plan:     1. Health care maintenance Comments: A full exam was performed.  Importance of monthly self breast exams was discussed with the patient. PATIENT IS ADVISED TO GET 30-45 MINUTES REGULAR EXERCISE NO LESS THAN FOUR TO FIVE DAYS PER WEEK - BOTH WEIGHTBEARING EXERCISES AND AEROBIC ARE RECOMMENDED.  PATIENT IS ADVISED TO FOLLOW A HEALTHY DIET WITH AT LEAST SIX FRUITS/VEGGIES PER DAY, DECREASE INTAKE OF RED MEAT, AND TO INCREASE FISH INTAKE TO TWO DAYS PER WEEK.  MEATS/FISH SHOULD NOT BE FRIED, BAKED OR BROILED IS PREFERABLE.  I SUGGEST WEARING SPF 50 SUNSCREEN ON EXPOSED PARTS AND ESPECIALLY WHEN IN THE DIRECT SUNLIGHT FOR AN EXTENDED PERIOD OF TIME.  PLEASE AVOID FAST FOOD RESTAURANTS AND INCREASE YOUR WATER INTAKE.  - Hepatitis C antibody  2. Uncontrolled type 2 diabetes mellitus with hyperglycemia ([place]) Comments: Diabetic foot exam was performed. She does not wish to resume Ozempic. She will f/u in four months for re-evaluation.  I DISCUSSED WITH THE PATIENT AT LENGTH REGARDING THE GOALS OF GLYCEMIC CONTROL AND POSSIBLE LONG-TERM COMPLICATIONS.  I  ALSO STRESSED THE IMPORTANCE OF COMPLIANCE WITH HOME GLUCOSE MONITORING, DIETARY RESTRICTIONS INCLUDING AVOIDANCE OF SUGARY DRINKS/PROCESSED FOODS,  ALONG WITH REGULAR EXERCISE.  I  ALSO STRESSED THE IMPORTANCE OF ANNUAL EYE EXAMS, SELF FOOT CARE AND COMPLIANCE WITH OFFICE VISITS.  - POCT Urinalysis Dipstick (81002) - POCT UA - Microalbumin - Hemoglobin A1c - CBC - CMP14+EGFR - Lipid panel  3. Essential hypertension, benign Comments: Chronic, well controlled. She is encouraged to follow low sodium diet. 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  She will f/u in four to six months for re-evaluation.  -  EKG 12-Lead  4. Pure hypercholesterolemia  Comments: Chronic.I will check lipid panel today. Previous LDL 71, nearly at goal. However, she would likely still benefit from low dose statin therapy.   5. Class 2 severe obesity due to excess calories with serious comorbidity and body mass index (BMI) of 36.0 to 36.9 in adult [facility]) Comments: She was congratulated on her 10 pound weight loss since April 2021. Encouraged to strive for BMI less than 30 to decrease cardiac risk.  She is encouraged to strive for BMI less than 30 to decrease cardiac risk. Advised to aim for at least 150 minutes of exercise per week.  Patient was given opportunity to ask questions. Patient verbalized understanding of the plan and was able to repeat key elements of the plan. All questions were answered to their satisfaction.  Gabrielle Greenland, MD   I, Gabrielle Greenland, MD, have reviewed all documentation for this visit. The documentation on 11/03/20 for the exam, diagnosis, procedures, and orders are all accurate and complete.  THE PATIENT IS ENCOURAGED TO PRACTICE SOCIAL DISTANCING DUE TO THE COVID-19 PANDEMIC.

## 2020-10-13 NOTE — Patient Instructions (Signed)
Health Maintenance, Female Adopting a healthy lifestyle and getting preventive care are important in promoting health and wellness. Ask your health care provider about:  The right schedule for you to have regular tests and exams.  Things you can do on your own to prevent diseases and keep yourself healthy. What should I know about diet, weight, and exercise? Eat a healthy diet  Eat a diet that includes plenty of vegetables, fruits, low-fat dairy products, and lean protein.  Do not eat a lot of foods that are high in solid fats, added sugars, or sodium.   Maintain a healthy weight Body mass index (BMI) is used to identify weight problems. It estimates body fat based on height and weight. Your health care provider can help determine your BMI and help you achieve or maintain a healthy weight. Get regular exercise Get regular exercise. This is one of the most important things you can do for your health. Most adults should:  Exercise for at least 150 minutes each week. The exercise should increase your heart rate and make you sweat (moderate-intensity exercise).  Do strengthening exercises at least twice a week. This is in addition to the moderate-intensity exercise.  Spend less time sitting. Even light physical activity can be beneficial. Watch cholesterol and blood lipids Have your blood tested for lipids and cholesterol at 44 years of age, then have this test every 5 years. Have your cholesterol levels checked more often if:  Your lipid or cholesterol levels are high.  You are older than 44 years of age.  You are at high risk for heart disease. What should I know about cancer screening? Depending on your health history and family history, you may need to have cancer screening at various ages. This may include screening for:  Breast cancer.  Cervical cancer.  Colorectal cancer.  Skin cancer.  Lung cancer. What should I know about heart disease, diabetes, and high blood  pressure? Blood pressure and heart disease  High blood pressure causes heart disease and increases the risk of stroke. This is more likely to develop in people who have high blood pressure readings, are of African descent, or are overweight.  Have your blood pressure checked: ? Every 3-5 years if you are 18-39 years of age. ? Every year if you are 40 years old or older. Diabetes Have regular diabetes screenings. This checks your fasting blood sugar level. Have the screening done:  Once every three years after age 40 if you are at a normal weight and have a low risk for diabetes.  More often and at a younger age if you are overweight or have a high risk for diabetes. What should I know about preventing infection? Hepatitis B If you have a higher risk for hepatitis B, you should be screened for this virus. Talk with your health care provider to find out if you are at risk for hepatitis B infection. Hepatitis C Testing is recommended for:  Everyone born from 1945 through 1965.  Anyone with known risk factors for hepatitis C. Sexually transmitted infections (STIs)  Get screened for STIs, including gonorrhea and chlamydia, if: ? You are sexually active and are younger than 44 years of age. ? You are older than 44 years of age and your health care provider tells you that you are at risk for this type of infection. ? Your sexual activity has changed since you were last screened, and you are at increased risk for chlamydia or gonorrhea. Ask your health care provider   if you are at risk.  Ask your health care provider about whether you are at high risk for HIV. Your health care provider may recommend a prescription medicine to help prevent HIV infection. If you choose to take medicine to prevent HIV, you should first get tested for HIV. You should then be tested every 3 months for as long as you are taking the medicine. Pregnancy  If you are about to stop having your period (premenopausal) and  you may become pregnant, seek counseling before you get pregnant.  Take 400 to 800 micrograms (mcg) of folic acid every day if you become pregnant.  Ask for birth control (contraception) if you want to prevent pregnancy. Osteoporosis and menopause Osteoporosis is a disease in which the bones lose minerals and strength with aging. This can result in bone fractures. If you are 65 years old or older, or if you are at risk for osteoporosis and fractures, ask your health care provider if you should:  Be screened for bone loss.  Take a calcium or vitamin D supplement to lower your risk of fractures.  Be given hormone replacement therapy (HRT) to treat symptoms of menopause. Follow these instructions at home: Lifestyle  Do not use any products that contain nicotine or tobacco, such as cigarettes, e-cigarettes, and chewing tobacco. If you need help quitting, ask your health care provider.  Do not use street drugs.  Do not share needles.  Ask your health care provider for help if you need support or information about quitting drugs. Alcohol use  Do not drink alcohol if: ? Your health care provider tells you not to drink. ? You are pregnant, may be pregnant, or are planning to become pregnant.  If you drink alcohol: ? Limit how much you use to 0-1 drink a day. ? Limit intake if you are breastfeeding.  Be aware of how much alcohol is in your drink. In the [place] one drink equals one 12 oz bottle of beer (355 mL), one 5 oz glass of wine (148 mL), or one 1 oz glass of hard liquor (44 mL). General instructions  Schedule regular health, dental, and eye exams.  Stay current with your vaccines.  Tell your health care provider if: ? You often feel depressed. ? You have ever been abused or do not feel safe at home. Summary  Adopting a healthy lifestyle and getting preventive care are important in promoting health and wellness.  Follow your health care provider's instructions about healthy  diet, exercising, and getting tested or screened for diseases.  Follow your health care provider's instructions on monitoring your cholesterol and blood pressure. This information is not intended to replace advice given to you by your health care provider. Make sure you discuss any questions you have with your health care provider. Document Revised: 08/30/2018 Document Reviewed: 08/30/2018 [employer]vier Patient Education  2021 [employer]  

## 2020-10-14 LAB — CMP14+EGFR
ALT: 19 IU/L (ref 0–32)
AST: 15 IU/L (ref 0–40)
Albumin/Globulin Ratio: 1.4 (ref 1.2–2.2)
Albumin: 4.3 g/dL (ref 3.8–4.8)
Alkaline Phosphatase: 106 IU/L (ref 44–121)
BUN/Creatinine Ratio: 7 — ABNORMAL LOW (ref 9–23)
BUN: 6 mg/dL (ref 6–24)
Bilirubin Total: 0.2 mg/dL (ref 0.0–1.2)
CO2: 27 mmol/L (ref 20–29)
Calcium: 9.6 mg/dL (ref 8.7–10.2)
Chloride: 98 mmol/L (ref 96–106)
Creatinine, Ser: 0.81 mg/dL (ref 0.57–1.00)
GFR calc Af Amer: 103 mL/min/{1.73_m2} (ref >59)
GFR calc non Af Amer: 89 mL/min/{1.73_m2} (ref >59)
Globulin, Total: 3 g/dL (ref 1.5–4.5)
Glucose: 106 mg/dL — ABNORMAL HIGH (ref 65–99)
Potassium: 3.6 mmol/L (ref 3.5–5.2)
Sodium: 139 mmol/L (ref 134–144)
Total Protein: 7.3 g/dL (ref 6.0–8.5)

## 2020-10-14 LAB — LIPID PANEL
Chol/HDL Ratio: 3.2 ratio (ref 0.0–4.4)
Cholesterol, Total: 136 mg/dL (ref 100–199)
HDL: 43 mg/dL (ref >39)
LDL Chol Calc (NIH): 72 mg/dL (ref 0–99)
Triglycerides: 118 mg/dL (ref 0–149)
VLDL Cholesterol Cal: 21 mg/dL (ref 5–40)

## 2020-10-14 LAB — CBC
Hematocrit: 33.2 % — ABNORMAL LOW (ref 34.0–46.6)
Hemoglobin: 10 g/dL — ABNORMAL LOW (ref 11.1–15.9)
MCH: 23.2 pg — ABNORMAL LOW (ref 26.6–33.0)
MCHC: 30.1 g/dL — ABNORMAL LOW (ref 31.5–35.7)
MCV: 77 fL — ABNORMAL LOW (ref 79–97)
Platelets: 547 10*3/uL — ABNORMAL HIGH (ref 150–450)
RBC: 4.31 x10E6/uL (ref 3.77–5.28)
RDW: 15 % (ref 11.7–15.4)
WBC: 5.3 10*3/uL (ref 3.4–10.8)

## 2020-10-14 LAB — HEMOGLOBIN A1C
Est. average glucose Bld gHb Est-mCnc: 131 mg/dL
Hgb A1c MFr Bld: 6.2 % — ABNORMAL HIGH (ref 4.8–5.6)

## 2020-10-14 LAB — HEPATITIS C ANTIBODY: Hep C Virus Ab: <0.1 s/co ratio (ref 0.0–0.9)

## 2020-10-16 LAB — IRON AND TIBC
Iron Saturation: 6 % — CL (ref 15–55)
Iron: 27 ug/dL (ref 27–159)
Total Iron Binding Capacity: 457 ug/dL — ABNORMAL HIGH (ref 250–450)
UIBC: 430 ug/dL — ABNORMAL HIGH (ref 131–425)

## 2020-10-16 LAB — SPECIMEN STATUS REPORT

## 2020-10-16 LAB — FERRITIN: Ferritin: 9 ng/mL — ABNORMAL LOW (ref 15–150)

## 2020-10-17 ENCOUNTER — Encounter: Payer: Self-pay | Admitting: Internal Medicine

## 2020-10-18 ENCOUNTER — Other Ambulatory Visit: Payer: Self-pay | Admitting: Internal Medicine

## 2020-10-18 DIAGNOSIS — D500 Iron deficiency anemia secondary to blood loss (chronic): Secondary | ICD-10-CM

## 2020-10-18 DIAGNOSIS — N920 Excessive and frequent menstruation with regular cycle: Secondary | ICD-10-CM

## 2020-10-18 MED ORDER — FUSION PLUS PO CAPS: ORAL_CAPSULE | ORAL | 3 refills | Status: DC

## 2020-10-19 ENCOUNTER — Ambulatory Visit
Admission: RE | Admit: 2020-10-19 | Discharge: 2020-10-19 | Disposition: A | Discharge status: Home / Self Care | Source: Ambulatory Visit | Attending: Emergency Medicine | Admitting: Emergency Medicine

## 2020-10-19 ENCOUNTER — Other Ambulatory Visit: Payer: Self-pay

## 2020-10-19 VITALS — BP 110/70 | HR 105 | Temp 100.9°F | Resp 20

## 2020-10-19 DIAGNOSIS — Z20822 Contact with and (suspected) exposure to covid-19: Secondary | ICD-10-CM | POA: Insufficient documentation

## 2020-10-19 DIAGNOSIS — J029 Acute pharyngitis, unspecified: Secondary | ICD-10-CM | POA: Insufficient documentation

## 2020-10-19 HISTORY — DX: Gastro-esophageal reflux disease without esophagitis: K21.9

## 2020-10-19 LAB — POCT RAPID STREP A (OFFICE): Rapid Strep A Screen: NEGATIVE

## 2020-10-19 MED ORDER — LIDOCAINE VISCOUS HCL 2 % MT SOLN: 15.0000 mL | OROMUCOSAL | 0 refills | Status: DC | PRN

## 2020-10-19 NOTE — ED Provider Notes (Signed)
[facility]    CSN: [id number] Arrival date & time: 10/19/20  9323      History   Chief Complaint Chief Complaint  Patient presents with  . Sore Throat    HPI Gabrielle Perkins is a 45 y.o. female  With history as below presenting for chills, sore throat, fever since last night.  Feels like she is had a little bit of wheezing in her chest for last few days.  Does have mild myalgias, though states it is more near her trapezius and neck, second to lymphadenopathy.  Denies chest pain, shortness of breath, drooling, dental pain.  No known sick contacts, did have COVID shots.  Past Medical History:  Diagnosis Date  . Diabetes mellitus without complication ([place])   . GERD (gastroesophageal reflux disease)   . Hypertension     Patient Active Problem List   Diagnosis Date Noted  . Obesity, diabetes, and hypertension syndrome ([place]) 09/10/2019  . Uncontrolled type 2 diabetes mellitus with hyperglycemia ([place]) 09/10/2019  . 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 Essential hypertension, benign 09/08/2017  . Diabetes mellitus without complication ([place]) 55/73/2202  . Chest pain 09/08/2017  . Hypokalemia 09/08/2017    Past Surgical History:  Procedure Laterality Date  . btl    . LEFT HEART CATH AND CORONARY ANGIOGRAPHY N/A 09/12/2017   Procedure: LEFT HEART CATH AND CORONARY ANGIOGRAPHY;  Surgeon: Dixie Dials, MD;  Location: [facility];  Service: Cardiovascular;  Laterality: N/A;  . LIPOMA EXCISION N/A 10/04/2019   Procedure: EXCISION OF FOREHEAD LIPOMA;  Surgeon: Cindra Presume, MD;  Location: [facility];  Service: Plastics;  Laterality: N/A;  45 min, please  . TUBAL LIGATION      OB History   No obstetric history on file.      Home Medications    Prior to Admission medications   Medication Sig Start Date End Date Taking? Authorizing Provider  acetaminophen (TYLENOL) 500 MG tablet Take 500 mg by mouth every 6 (six) hours as needed.   Yes [provider]  amLODipine (NORVASC) 5 MG tablet Take 1 tablet (5 mg total) by mouth daily. 10/13/20  Yes Glendale Chard, MD  aspirin EC 81 MG tablet Take 1 tablet (81 mg total) by mouth daily. 10/13/20  Yes Glendale Chard, MD  cetirizine (ZYRTEC) 10 MG tablet Take 1 tablet (10 mg total) by mouth daily. 10/13/20  Yes Glendale Chard, MD  fluticasone [facility]) 50 MCG/ACT nasal spray Place 2 sprays into both nostrils daily. 10/13/20  Yes Glendale Chard, MD  lidocaine (XYLOCAINE) 2 % solution Use as directed 15 mLs in the mouth or throat as needed for mouth pain. 10/19/20  Yes Hall-Potvin, [place], PA-C  metFORMIN (GLUCOPHAGE-XR) 500 MG 24 hr tablet Take 2 tablets (1,000 mg total) by mouth daily with supper. 10/13/20  Yes Glendale Chard, MD  metoprolol succinate (TOPROL XL) 50 MG 24 hr tablet Take 1 tablet (50 mg total) by mouth daily. 10/13/20  Yes Glendale Chard, MD  omeprazole (PRILOSEC) 40 MG capsule Take 1 capsule (40 mg total) by mouth daily. 10/13/20 10/13/21 Yes Glendale Chard, MD  telmisartan-hydrochlorothiazide (MICARDIS HCT) 80-25 MG tablet Take 1 tablet by mouth daily. 10/13/20  Yes Glendale Chard, MD  Blood Glucose Monitoring Suppl (FREESTYLE FREEDOM LITE) w/Device KIT Use as directed to check blood sugars 2 times per day dx: e11.22 05/10/19   Glendale Chard, MD  glucose blood (FREESTYLE LITE) test strip Use as instructed to check blood sugars 2 times per  day dx: e11.22 05/10/19   Glendale Chard, MD  Iron-FA-B Cmp-C-Biot-Probiotic (FUSION PLUS) CAPS One capsule po qd 10/18/20   Glendale Chard, MD  Microlet Lancets MISC Use as directed to check blood sugars 2 times per day dx: e11.22 05/10/19   Glendale Chard, MD    Family History Family History  Problem Relation Age of Onset  . Coronary artery disease Maternal Grandmother   . Hypertension Mother   . Diabetes Mother   . Hypertension Father   . Diabetes Father   . Sarcoidosis Father   . Breast cancer Neg Hx     Social History Social History    Tobacco Use  . Smoking status: Never Smoker  . Smokeless tobacco: Never Used  Vaping Use  . Vaping Use: Never used  Substance Use Topics  . Alcohol use: No  . Drug use: Never     Allergies   Patient has no known allergies.   Review of Systems Review of Systems  Constitutional: Positive for fever. Negative for fatigue.  HENT: Positive for sore throat. Negative for congestion, dental problem, ear pain, facial swelling, hearing loss, sinus pain, trouble swallowing and voice change.   Eyes: Negative for photophobia, pain and visual disturbance.  Respiratory: Negative for cough and shortness of breath.   Cardiovascular: Negative for chest pain and palpitations.  Gastrointestinal: Negative for diarrhea and vomiting.  Musculoskeletal: Positive for myalgias. Negative for arthralgias.  Neurological: Negative for dizziness and headaches.     Physical Exam Triage Vital Signs ED Triage Vitals  Enc Vitals Group     BP 10/19/20 0915 110/70     Pulse Rate 10/19/20 0915 (!) 105     Resp 10/19/20 0915 20     Temp 10/19/20 0915 (!) 100.9 F (38.3 C)     Temp Source 10/19/20 0915 Oral     SpO2 10/19/20 0915 96 %     Weight --      Height --      Head Circumference --      Peak Flow --      Pain Score 10/19/20 1004 7     Pain Loc --      Pain Edu? --      Excl. in [place]? --    No data found.  Updated Vital Signs BP 110/70 (BP Location: Left Arm)   Pulse (!) 105   Temp (!) 100.9 F (38.3 C) (Oral)   Resp 20   LMP 09/28/2020 (Exact Date)   SpO2 96%   Visual Acuity Right Eye Distance:   Left Eye Distance:   Bilateral Distance:    Right Eye Near:   Left Eye Near:    Bilateral Near:     Physical Exam Constitutional:      General: She is not in acute distress.    Appearance: She is not ill-appearing or diaphoretic.  HENT:     Head: Normocephalic and atraumatic.     Right Ear: Tympanic membrane and ear canal normal.     Left Ear: Tympanic membrane and ear canal  normal.     Mouth/Throat:     Mouth: Mucous membranes are moist.     Pharynx: Oropharynx is clear. Uvula midline. Posterior oropharyngeal erythema present. No oropharyngeal exudate or uvula swelling.     Tonsils: 1+ on the right. 1+ on the left.  Eyes:     General: No scleral icterus.    Conjunctiva/sclera: Conjunctivae normal.     Pupils: Pupils are equal, round, and reactive to  light.  Neck:     Comments: Trachea midline, negative JVD Cardiovascular:     Rate and Rhythm: Normal rate and regular rhythm.     Heart sounds: No murmur heard. No gallop.   Pulmonary:     Effort: Pulmonary effort is normal. No respiratory distress.     Breath sounds: No wheezing, rhonchi or rales.  Musculoskeletal:     Cervical back: Neck supple. No tenderness.  Lymphadenopathy:     Cervical: Cervical adenopathy present.  Skin:    Capillary Refill: Capillary refill takes less than 2 seconds.     Coloration: Skin is not jaundiced or pale.     Findings: No rash.  Neurological:     General: No focal deficit present.     Mental Status: She is alert and oriented to person, place, and time.      UC Treatments / Results  Labs (all labs ordered are listed, but only abnormal results are displayed) Labs Reviewed  NOVEL CORONAVIRUS, NAA  CULTURE, GROUP A STREP [facility])  POCT RAPID STREP A (OFFICE)    EKG   Radiology No results found.  Procedures Procedures (including critical care time)  Medications Ordered in UC Medications - No data to display  Initial Impression / Assessment and Plan / UC Course  I have reviewed the triage vital signs and the nursing notes.  Pertinent labs & imaging results that were available during my care of the patient were reviewed by me and considered in my medical decision making (see chart for details).     Patient febrile, nontoxic, with SpO2 96%.  Rapid strep negative, culture and Covid PCR pending.  Patient to quarantine until results are back.  We will treat  supportively as outlined below.  Return precautions discussed, patient verbalized understanding and is agreeable to plan. Final Clinical Impressions(s) / UC Diagnoses   Final diagnoses:  Encounter for laboratory testing for COVID-19 virus  Sore throat     Discharge Instructions     Your rapid strep test was negative today.  The culture is pending.  Please look on your MyChart for test results.   We will notify you if the culture positive and outline a treatment plan at that time.   Please continue Tylenol and/or Ibuprofen as needed for fever, pain.  May try warm salt water gargles, cepacol lozenges, throat spray, warm tea or water with lemon/honey, or OTC cold relief medicine for throat discomfort.   For congestion: take a daily anti-histamine like Zyrtec, Claritin, and a oral decongestant to help with post nasal drip that may be irritating your throat.   It is important to stay hydrated: drink plenty of fluids (primarily water) to keep your throat moisturized and help further relieve irritation/discomfort.     ED Prescriptions    Medication Sig Dispense Auth. Provider   lidocaine (XYLOCAINE) 2 % solution Use as directed 15 mLs in the mouth or throat as needed for mouth pain. 100 mL Hall-Potvin, [place], PA-C     PDMP not reviewed this encounter.   Hall-Potvin, [place], [place] 10/19/20 1207

## 2020-10-19 NOTE — ED Triage Notes (Signed)
Started with chills and sore throat yesterday with temps of 99.5.  Last night felt "a little bit of wheezing in my chest".  C/O body aches.

## 2020-10-19 NOTE — Discharge Instructions (Addendum)

## 2020-10-20 LAB — SARS-COV-2, NAA 2 DAY TAT

## 2020-10-20 LAB — NOVEL CORONAVIRUS, NAA: SARS-CoV-2, NAA: DETECTED — AB

## 2020-10-21 ENCOUNTER — Encounter: Payer: Self-pay | Admitting: Internal Medicine

## 2020-10-22 ENCOUNTER — Other Ambulatory Visit: Payer: Self-pay

## 2020-10-22 NOTE — Telephone Encounter (Signed)
Prior auth done for fusion plus waiting on a response from the [employer].

## 2020-10-23 LAB — CULTURE, GROUP A STREP (THRC)

## 2020-10-29 ENCOUNTER — Telehealth: Payer: Self-pay

## 2020-10-29 NOTE — Telephone Encounter (Signed)
Pt stopped by the office to ask about a PA for a prescription LVM for pt to call the office back when she gets a chance

## 2020-10-31 ENCOUNTER — Telehealth: Payer: Self-pay

## 2020-10-31 NOTE — Telephone Encounter (Signed)
The pt was notified that Dr Baird Cancer said her transabdominal / transvaginal pelvic ultrasound shows that the pt has a fibroid and needed to see her gyn for a follow-up.  The results was faxed to Dr. Katharine Look Rivard.

## 2020-11-07 ENCOUNTER — Encounter: Payer: Self-pay | Admitting: Internal Medicine

## 2021-01-14 ENCOUNTER — Encounter: Payer: Self-pay | Admitting: Internal Medicine

## 2021-01-14 LAB — HM MAMMOGRAPHY

## 2021-01-16 ENCOUNTER — Encounter: Payer: Self-pay | Admitting: Internal Medicine

## 2021-02-10 ENCOUNTER — Ambulatory Visit: Admitting: Internal Medicine

## 2021-03-11 LAB — HM DIABETES EYE EXAM

## 2021-03-18 ENCOUNTER — Ambulatory Visit (INDEPENDENT_AMBULATORY_CARE_PROVIDER_SITE_OTHER): Admitting: Internal Medicine

## 2021-03-18 ENCOUNTER — Other Ambulatory Visit: Payer: Self-pay

## 2021-03-18 ENCOUNTER — Encounter: Payer: Self-pay | Admitting: Internal Medicine

## 2021-03-18 VITALS — BP 114/82 | HR 79 | Temp 98.6°F | Ht 63.4 in | Wt 207.8 lb

## 2021-03-18 DIAGNOSIS — I10 Essential (primary) hypertension: Secondary | ICD-10-CM

## 2021-03-18 DIAGNOSIS — D500 Iron deficiency anemia secondary to blood loss (chronic): Secondary | ICD-10-CM

## 2021-03-18 DIAGNOSIS — E1165 Type 2 diabetes mellitus with hyperglycemia: Secondary | ICD-10-CM

## 2021-03-18 DIAGNOSIS — E7800 Pure hypercholesterolemia, unspecified: Secondary | ICD-10-CM | POA: Insufficient documentation

## 2021-03-18 MED ORDER — FUSION PLUS PO CAPS: ORAL_CAPSULE | ORAL | 3 refills | Status: DC

## 2021-03-18 NOTE — Progress Notes (Signed)
I,[name],acting as a Education administrator for [name], MD.,have documented all relevant documentation on the behalf of [name], MD,as directed by  [name], MD while in the presence of [name], MD.  This visit occurred during the SARS-CoV-2 public health emergency.  Safety protocols were in place, including screening questions prior to the visit, additional usage of staff PPE, and extensive cleaning of exam room while observing appropriate contact time as indicated for disinfecting solutions.  Subjective:     Patient ID: Gabrielle Perkins , female    DOB: 10-May-1976 , 45 y.o.   MRN: [id number]   Chief Complaint  Patient presents with   Diabetes   Hypertension    HPI  She presents today for diabetes and blood pressure f/u.  She reports compliance with meds. Still off of Ozempic. She has no specific concerns or complaints at this time.   Diabetes She presents for her follow-up diabetic visit. She has type 2 diabetes mellitus. There are no hypoglycemic associated symptoms. There are no diabetic associated symptoms. There are no hypoglycemic complications. Risk factors for coronary artery disease include diabetes mellitus, dyslipidemia, hypertension, obesity and sedentary lifestyle. Current diabetic treatment includes oral agent (monotherapy). She is compliant with treatment some of the time. She is following a generally healthy diet. She participates in exercise intermittently. Her breakfast blood glucose is taken between 8-9 am. Her breakfast blood glucose range is generally 90-110 mg/dl. Eye exam is current.  Hypertension This is a chronic problem. The current episode started more than 1 year ago. The problem has been gradually improving since onset. The problem is controlled. Past treatments include angiotensin blockers and diuretics. The current treatment provides moderate improvement. Compliance problems include exercise.     Past Medical History:  Diagnosis Date    Diabetes mellitus without complication (HCC)    GERD (gastroesophageal reflux disease)    Hypertension      Family History  Problem Relation Age of Onset   Coronary artery disease Maternal Grandmother    Hypertension Mother    Diabetes Mother    Hypertension Father    Diabetes Father    Sarcoidosis Father    Breast cancer Neg Hx      Current Outpatient Medications:    acetaminophen (TYLENOL) 500 MG tablet, Take 500 mg by mouth every 6 (six) hours as needed., Disp: , Rfl:    amLODipine (NORVASC) 5 MG tablet, Take 1 tablet (5 mg total) by mouth daily., Disp: 90 tablet, Rfl: 2   aspirin EC 81 MG tablet, Take 1 tablet (81 mg total) by mouth daily., Disp: 90 tablet, Rfl: 2   Blood Glucose Monitoring Suppl (FREESTYLE FREEDOM LITE) w/Device KIT, Use as directed to check blood sugars 2 times per day dx: e11.22, Disp: 1 kit, Rfl: 1   cetirizine (ZYRTEC) 10 MG tablet, Take 1 tablet (10 mg total) by mouth daily., Disp: 90 tablet, Rfl: 2   fluticasone (FLONASE) 50 MCG/ACT nasal spray, Place 2 sprays into both nostrils daily., Disp: 16 g, Rfl: 2   glucose blood (FREESTYLE LITE) test strip, Use as instructed to check blood sugars 2 times per day dx: e11.22, Disp: 150 each, Rfl: 2   levonorgestrel (MIRENA, 52 MG,) 20 MCG/DAY IUD, Mirena 20 mcg/24 hours (7 yrs) 52 mg intrauterine device  Take 1 device by intrauterine route., Disp: , Rfl:    metFORMIN (GLUCOPHAGE-XR) 500 MG 24 hr tablet, Take 2 tablets (1,000 mg total) by mouth daily with supper., Disp: 90  tablet, Rfl: 2   metoprolol succinate (TOPROL XL) 50 MG 24 hr tablet, Take 1 tablet (50 mg total) by mouth daily., Disp: 90 tablet, Rfl: 2   Microlet Lancets MISC, Use as directed to check blood sugars 2 times per day dx: e11.22, Disp: 150 each, Rfl: 2   omeprazole (PRILOSEC) 40 MG capsule, Take 1 capsule (40 mg total) by mouth daily., Disp: 90 capsule, Rfl: 2   telmisartan-hydrochlorothiazide (MICARDIS HCT) 80-25 MG tablet, Take 1 tablet by mouth  daily., Disp: 90 tablet, Rfl: 2   Iron-FA-B Cmp-C-Biot-Probiotic (FUSION PLUS) CAPS, One capsule po qd, Disp: 90 capsule, Rfl: 3   lidocaine (XYLOCAINE) 2 % solution, Use as directed 15 mLs in the mouth or throat as needed for mouth pain. (Patient not taking: Reported on 03/18/2021), Disp: 100 mL, Rfl: 0   No Known Allergies   Review of Systems  Constitutional: Negative.   Respiratory: Negative.    Cardiovascular: Negative.   Gastrointestinal: Negative.   Psychiatric/Behavioral: Negative.    All other systems reviewed and are negative.   Today's Vitals   03/18/21 1030  BP: 114/82  Pulse: 79  Temp: 98.6 F (37 C)  TempSrc: Oral  Weight: 207 lb 12.8 oz (94.3 kg)  Height: 5' 3.4" (1.61 m)   Body mass index is 36.35 kg/m.  Wt Readings from Last 3 Encounters:  03/18/21 207 lb 12.8 oz (94.3 kg)  10/13/20 209 lb 12.8 oz (95.2 kg)  01/15/20 219 lb (99.3 kg)    BP Readings from Last 3 Encounters:  03/18/21 114/82  10/19/20 110/70  10/13/20 122/80    Objective:  Physical Exam Vitals and nursing note reviewed.  Constitutional:      Appearance: Normal appearance. She is obese.  HENT:     Head: Normocephalic and atraumatic.     Nose:     Comments: Masked     Mouth/Throat:     Comments: Masked Cardiovascular:     Rate and Rhythm: Normal rate and regular rhythm.     Heart sounds: Normal heart sounds.  Pulmonary:     Effort: Pulmonary effort is normal.     Breath sounds: Normal breath sounds.  Musculoskeletal:     Cervical back: Normal range of motion.  Skin:    General: Skin is warm.  Neurological:     General: No focal deficit present.     Mental Status: She is alert.  Psychiatric:        Mood and Affect: Mood normal.        Behavior: Behavior normal.        Assessment And Plan:     1. Uncontrolled type 2 diabetes mellitus with hyperglycemia ([place]) Comments: I will request her 2021 eye exam. I will check labs as listed below. I will adjust meds as needed. She will  rto in 4 months for re-evaluation.  - BMP8+EGFR - Hemoglobin A1c  2. Essential hypertension, benign Comments: Chronic, well controlled. She is encouraged to limit her intake of salty foods. NO med changes today.   3. Iron deficiency anemia due to chronic blood loss Comments: Due to menorrhagia/uterine fibroids. She will c/w iron def anemia.  - CBC no Diff - Iron and IBC ([id number]) - Ferritin    Patient was given opportunity to ask questions. Patient verbalized understanding of the plan and was able to repeat key elements of the plan. All questions were answered to their satisfaction.   I, [name], MD, have reviewed all documentation for this visit.  The documentation on 03/18/21 for the exam, diagnosis, procedures, and orders are all accurate and complete.   IF YOU HAVE BEEN REFERRED TO A SPECIALIST, IT MAY TAKE 1-2 WEEKS TO SCHEDULE/PROCESS THE REFERRAL. IF YOU HAVE NOT HEARD FROM US/SPECIALIST IN TWO WEEKS, PLEASE GIVE [place] A CALL AT [phone] X [phone].   THE PATIENT IS ENCOURAGED TO PRACTICE SOCIAL DISTANCING DUE TO THE COVID-19 PANDEMIC.

## 2021-03-18 NOTE — Patient Instructions (Signed)
Mediterranean Diet A Mediterranean diet refers to food and lifestyle choices that are based on the traditions of countries located on the [place] Sea. This way of eating has been shown to help prevent certain conditions and improve outcomes for people who have chronic diseases, like kidney disease and heart disease. What are tips for following this plan? Lifestyle  Cook and eat meals together with your family, when possible.  Drink enough fluid to keep your urine clear or pale yellow.  Be physically active every day. This includes: ? Aerobic exercise like running or swimming. ? Leisure activities like gardening, walking, or housework.  Get 7-8 hours of sleep each night.  If recommended by your health care provider, drink red wine in moderation. This means 1 glass a day for nonpregnant women and 2 glasses a day for men. A glass of wine equals 5 oz (150 mL). Reading food labels  Check the serving size of packaged foods. For foods such as rice and pasta, the serving size refers to the amount of cooked product, not dry.  Check the total fat in packaged foods. Avoid foods that have saturated fat or trans fats.  Check the ingredients list for added sugars, such as corn syrup.   Shopping  At the grocery store, buy most of your food from the areas near the walls of the store. This includes: ? Fresh fruits and vegetables (produce). ? Grains, beans, nuts, and seeds. Some of these may be available in unpackaged forms or large amounts (in bulk). ? Fresh seafood. ? Poultry and eggs. ? Low-fat dairy products.  Buy whole ingredients instead of prepackaged foods.  Buy fresh fruits and vegetables in-season from local farmers markets.  Buy frozen fruits and vegetables in resealable bags.  If you do not have access to quality fresh seafood, buy precooked frozen shrimp or canned fish, such as tuna, salmon, or sardines.  Buy small amounts of raw or cooked vegetables, salads, or olives from  the deli or salad bar at your store.  Stock your pantry so you always have certain foods on hand, such as olive oil, canned tuna, canned tomatoes, rice, pasta, and beans. Cooking  Cook foods with extra-virgin olive oil instead of using butter or other vegetable oils.  Have meat as a side dish, and have vegetables or grains as your main dish. This means having meat in small portions or adding small amounts of meat to foods like pasta or stew.  Use beans or vegetables instead of meat in common dishes like chili or lasagna.  Experiment with different cooking methods. Try roasting or broiling vegetables instead of steaming or sauteing them.  Add frozen vegetables to soups, stews, pasta, or rice.  Add nuts or seeds for added healthy fat at each meal. You can add these to yogurt, salads, or vegetable dishes.  Marinate fish or vegetables using olive oil, lemon juice, garlic, and fresh herbs. Meal planning  Plan to eat 1 vegetarian meal one day each week. Try to work up to 2 vegetarian meals, if possible.  Eat seafood 2 or more times a week.  Have healthy snacks readily available, such as: ? Vegetable sticks with hummus. ? Greek yogurt. ? Fruit and nut trail mix.  Eat balanced meals throughout the week. This includes: ? Fruit: 2-3 servings a day ? Vegetables: 4-5 servings a day ? Low-fat dairy: 2 servings a day ? Fish, poultry, or lean meat: 1 serving a day ? Beans and legumes: 2 or more servings a week ?   Nuts and seeds: 1-2 servings a day ? Whole grains: 6-8 servings a day ? Extra-virgin olive oil: 3-4 servings a day  Limit red meat and sweets to only a few servings a month   What are my food choices?  Mediterranean diet ? Recommended  Grains: Whole-grain pasta. Brown rice. Bulgar wheat. Polenta. Couscous. Whole-wheat bread. Oatmeal. Quinoa.  Vegetables: Artichokes. Beets. Broccoli. Cabbage. Carrots. Eggplant. Green beans. Chard. Kale. Spinach. Onions. Leeks. Peas. Squash.  Tomatoes. Peppers. Radishes.  Fruits: Apples. Apricots. Avocado. Berries. Bananas. Cherries. Dates. Figs. Grapes. Lemons. Melon. Oranges. Peaches. Plums. Pomegranate.  Meats and other protein foods: Beans. Almonds. Sunflower seeds. Pine nuts. Peanuts. Cod. Salmon. Scallops. Shrimp. Tuna. Tilapia. Clams. Oysters. Eggs.  Dairy: Low-fat milk. Cheese. Greek yogurt.  Beverages: Water. Red wine. Herbal tea.  Fats and oils: Extra virgin olive oil. Avocado oil. Grape seed oil.  Sweets and desserts: Greek yogurt with honey. Baked apples. Poached pears. Trail mix.  Seasoning and other foods: Basil. Cilantro. Coriander. Cumin. Mint. Parsley. Sage. Rosemary. Tarragon. Garlic. Oregano. Thyme. Pepper. Balsalmic vinegar. Tahini. Hummus. Tomato sauce. Olives. Mushrooms. ? Limit these  Grains: Prepackaged pasta or rice dishes. Prepackaged cereal with added sugar.  Vegetables: Deep fried potatoes (french fries).  Fruits: Fruit canned in syrup.  Meats and other protein foods: Beef. Pork. Lamb. Poultry with skin. Hot dogs. Bacon.  Dairy: Ice cream. Sour cream. Whole milk.  Beverages: Juice. Sugar-sweetened soft drinks. Beer. Liquor and spirits.  Fats and oils: Butter. Canola oil. Vegetable oil. Beef fat (tallow). Lard.  Sweets and desserts: Cookies. Cakes. Pies. Candy.  Seasoning and other foods: Mayonnaise. Premade sauces and marinades. The items listed may not be a complete list. Talk with your dietitian about what dietary choices are right for you. Summary  The Mediterranean diet includes both food and lifestyle choices.  Eat a variety of fresh fruits and vegetables, beans, nuts, seeds, and whole grains.  Limit the amount of red meat and sweets that you eat.  Talk with your health care provider about whether it is safe for you to drink red wine in moderation. This means 1 glass a day for nonpregnant women and 2 glasses a day for men. A glass of wine equals 5 oz (150 mL). This information  is not intended to replace advice given to you by your health care provider. Make sure you discuss any questions you have with your health care provider. Document Revised: 05/06/2016 Document Reviewed: 04/29/2016 Elsevier Patient Education  2020 [employer]  

## 2021-03-19 ENCOUNTER — Encounter: Payer: Self-pay | Admitting: Internal Medicine

## 2021-03-19 LAB — CBC
Hematocrit: 32.5 % — ABNORMAL LOW (ref 34.0–46.6)
Hemoglobin: 9.8 g/dL — ABNORMAL LOW (ref 11.1–15.9)
MCH: 24.2 pg — ABNORMAL LOW (ref 26.6–33.0)
MCHC: 30.2 g/dL — ABNORMAL LOW (ref 31.5–35.7)
MCV: 80 fL (ref 79–97)
Platelets: 398 10*3/uL (ref 150–450)
RBC: 4.05 x10E6/uL (ref 3.77–5.28)
RDW: 16.9 % — ABNORMAL HIGH (ref 11.7–15.4)
WBC: 5.1 10*3/uL (ref 3.4–10.8)

## 2021-03-19 LAB — BMP8+EGFR
BUN/Creatinine Ratio: 11 (ref 9–23)
BUN: 8 mg/dL (ref 6–24)
CO2: 21 mmol/L (ref 20–29)
Calcium: 9.2 mg/dL (ref 8.7–10.2)
Chloride: 101 mmol/L (ref 96–106)
Creatinine, Ser: 0.75 mg/dL (ref 0.57–1.00)
Glucose: 111 mg/dL — ABNORMAL HIGH (ref 65–99)
Potassium: 4.1 mmol/L (ref 3.5–5.2)
Sodium: 138 mmol/L (ref 134–144)
eGFR: 101 mL/min/{1.73_m2} (ref >59)

## 2021-03-19 LAB — IRON AND TIBC
Iron Saturation: 5 % — CL (ref 15–55)
Iron: 20 ug/dL — ABNORMAL LOW (ref 27–159)
Total Iron Binding Capacity: 389 ug/dL (ref 250–450)
UIBC: 369 ug/dL (ref 131–425)

## 2021-03-19 LAB — HEMOGLOBIN A1C
Est. average glucose Bld gHb Est-mCnc: 131 mg/dL
Hgb A1c MFr Bld: 6.2 % — ABNORMAL HIGH (ref 4.8–5.6)

## 2021-03-19 LAB — FERRITIN: Ferritin: 16 ng/mL (ref 15–150)

## 2021-04-07 LAB — HM DIABETES EYE EXAM

## 2021-04-08 ENCOUNTER — Encounter: Payer: Self-pay | Admitting: Internal Medicine

## 2021-05-19 ENCOUNTER — Other Ambulatory Visit: Payer: Self-pay | Admitting: Internal Medicine

## 2021-05-19 DIAGNOSIS — Z1231 Encounter for screening mammogram for malignant neoplasm of breast: Secondary | ICD-10-CM

## 2021-06-29 ENCOUNTER — Other Ambulatory Visit: Payer: Self-pay

## 2021-06-29 ENCOUNTER — Ambulatory Visit
Admission: RE | Admit: 2021-06-29 | Discharge: 2021-06-29 | Disposition: A | Discharge status: Home / Self Care | Source: Ambulatory Visit | Attending: Internal Medicine | Admitting: Internal Medicine

## 2021-06-29 DIAGNOSIS — Z1231 Encounter for screening mammogram for malignant neoplasm of breast: Secondary | ICD-10-CM

## 2021-07-20 ENCOUNTER — Ambulatory Visit (INDEPENDENT_AMBULATORY_CARE_PROVIDER_SITE_OTHER): Admitting: Internal Medicine

## 2021-07-20 ENCOUNTER — Other Ambulatory Visit: Payer: Self-pay

## 2021-07-20 ENCOUNTER — Encounter: Payer: Self-pay | Admitting: Internal Medicine

## 2021-07-20 VITALS — BP 112/78 | HR 85 | Temp 98.9°F | Ht 63.4 in | Wt 215.0 lb

## 2021-07-20 DIAGNOSIS — E1165 Type 2 diabetes mellitus with hyperglycemia: Secondary | ICD-10-CM | POA: Diagnosis not present

## 2021-07-20 DIAGNOSIS — Z6837 Body mass index (BMI) 37.0-37.9, adult: Secondary | ICD-10-CM | POA: Diagnosis not present

## 2021-07-20 DIAGNOSIS — Z23 Encounter for immunization: Secondary | ICD-10-CM | POA: Diagnosis not present

## 2021-07-20 DIAGNOSIS — I10 Essential (primary) hypertension: Secondary | ICD-10-CM | POA: Diagnosis not present

## 2021-07-20 LAB — HEMOGLOBIN A1C
Est. average glucose Bld gHb Est-mCnc: 146 mg/dL
Hgb A1c MFr Bld: 6.7 % — ABNORMAL HIGH (ref 4.8–5.6)

## 2021-07-20 LAB — CMP14+EGFR
ALT: 17 IU/L (ref 0–32)
AST: 19 IU/L (ref 0–40)
Albumin/Globulin Ratio: 1.2 (ref 1.2–2.2)
Albumin: 4.3 g/dL (ref 3.8–4.8)
Alkaline Phosphatase: 125 IU/L — ABNORMAL HIGH (ref 44–121)
BUN/Creatinine Ratio: 10 (ref 9–23)
BUN: 8 mg/dL (ref 6–24)
Bilirubin Total: 0.3 mg/dL (ref 0.0–1.2)
CO2: 25 mmol/L (ref 20–29)
Calcium: 9.5 mg/dL (ref 8.7–10.2)
Chloride: 101 mmol/L (ref 96–106)
Creatinine, Ser: 0.78 mg/dL (ref 0.57–1.00)
Globulin, Total: 3.5 g/dL (ref 1.5–4.5)
Glucose: 125 mg/dL — ABNORMAL HIGH (ref 70–99)
Potassium: 4.2 mmol/L (ref 3.5–5.2)
Sodium: 138 mmol/L (ref 134–144)
Total Protein: 7.8 g/dL (ref 6.0–8.5)
eGFR: 96 mL/min/{1.73_m2} (ref >59)

## 2021-07-20 MED ORDER — PREVNAR 20 0.5 ML IM SUSY: 0.5000 mL | PREFILLED_SYRINGE | INTRAMUSCULAR | 0 refills | Status: AC

## 2021-07-20 NOTE — Patient Instructions (Signed)

## 2021-07-20 NOTE — Progress Notes (Signed)
I,[name],acting as a Education administrator for [name], MD.,have documented all relevant documentation on the behalf of [name], MD,as directed by  [name], MD while in the presence of [name], MD.  This visit occurred during the SARS-CoV-2 public health emergency.  Safety protocols were in place, including screening questions prior to the visit, additional usage of staff PPE, and extensive cleaning of exam room while observing appropriate contact time as indicated for disinfecting solutions.  Subjective:     Patient ID: Gabrielle Perkins , female    DOB: 1975/11/22 , 45 y.o.   MRN: [id number]   Chief Complaint  Patient presents with   Diabetes   Hypertension    HPI  She presents today for diabetes and blood pressure f/u. She reports compliance with meds. Denies headaches, chest pain and shortness of breath.   She states her AM sugars are less than 100. She has no other concerns.   Diabetes She presents for her follow-up diabetic visit. She has type 2 diabetes mellitus. There are no hypoglycemic associated symptoms. There are no diabetic associated symptoms. There are no hypoglycemic complications. Risk factors for coronary artery disease include diabetes mellitus, dyslipidemia, hypertension, obesity and sedentary lifestyle. Current diabetic treatment includes oral agent (monotherapy). She is compliant with treatment some of the time. She is following a generally healthy diet. She participates in exercise intermittently. Her breakfast blood glucose is taken between 8-9 am. Her breakfast blood glucose range is generally 90-110 mg/dl. Eye exam is current.  Hypertension This is a chronic problem. The current episode started more than 1 year ago. The problem has been gradually improving since onset. The problem is controlled. Past treatments include angiotensin blockers and diuretics. The current treatment provides moderate improvement. Compliance problems include exercise.      Past Medical History:  Diagnosis Date   Diabetes mellitus without complication (HCC)    GERD (gastroesophageal reflux disease)    Hypertension      Family History  Problem Relation Age of Onset   Coronary artery disease Maternal Grandmother    Hypertension Mother    Diabetes Mother    Hypertension Father    Diabetes Father    Sarcoidosis Father    Breast cancer Neg Hx      Current Outpatient Medications:    acetaminophen (TYLENOL) 500 MG tablet, Take 500 mg by mouth every 6 (six) hours as needed., Disp: , Rfl:    amLODipine (NORVASC) 5 MG tablet, Take 1 tablet (5 mg total) by mouth daily., Disp: 90 tablet, Rfl: 2   aspirin EC 81 MG tablet, Take 1 tablet (81 mg total) by mouth daily., Disp: 90 tablet, Rfl: 2   Blood Glucose Monitoring Suppl (FREESTYLE FREEDOM LITE) w/Device KIT, Use as directed to check blood sugars 2 times per day dx: e11.22, Disp: 1 kit, Rfl: 1   cetirizine (ZYRTEC) 10 MG tablet, Take 1 tablet (10 mg total) by mouth daily., Disp: 90 tablet, Rfl: 2   fluticasone (FLONASE) 50 MCG/ACT nasal spray, Place 2 sprays into both nostrils daily., Disp: 16 g, Rfl: 2   glucose blood (FREESTYLE LITE) test strip, Use as instructed to check blood sugars 2 times per day dx: e11.22, Disp: 150 each, Rfl: 2   Iron-FA-B Cmp-C-Biot-Probiotic (FUSION PLUS) CAPS, One capsule po qd, Disp: 90 capsule, Rfl: 3   levonorgestrel (MIRENA, 52 MG,) 20 MCG/DAY IUD, Mirena 20 mcg/24 hours (7 yrs) 52 mg intrauterine device  Take 1 device by intrauterine route., Disp: ,  Rfl:    lidocaine (XYLOCAINE) 2 % solution, Use as directed 15 mLs in the mouth or throat as needed for mouth pain., Disp: 100 mL, Rfl: 0   metFORMIN (GLUCOPHAGE-XR) 500 MG 24 hr tablet, Take 2 tablets (1,000 mg total) by mouth daily with supper., Disp: 90 tablet, Rfl: 2   metoprolol succinate (TOPROL XL) 50 MG 24 hr tablet, Take 1 tablet (50 mg total) by mouth daily., Disp: 90 tablet, Rfl: 2   Microlet Lancets MISC, Use as  directed to check blood sugars 2 times per day dx: e11.22, Disp: 150 each, Rfl: 2   omeprazole (PRILOSEC) 40 MG capsule, Take 1 capsule (40 mg total) by mouth daily., Disp: 90 capsule, Rfl: 2   pneumococcal 20-valent conjugate vaccine (PREVNAR 20) 0.5 ML injection, Inject 0.5 mLs into the muscle tomorrow at 10 am for 1 dose., Disp: 0.5 mL, Rfl: 0   telmisartan-hydrochlorothiazide (MICARDIS HCT) 80-25 MG tablet, Take 1 tablet by mouth daily., Disp: 90 tablet, Rfl: 2   No Known Allergies   Review of Systems  Constitutional: Negative.   Respiratory: Negative.    Cardiovascular: Negative.   Gastrointestinal: Negative.   Psychiatric/Behavioral: Negative.    All other systems reviewed and are negative.   Today's Vitals   07/20/21 0929  BP: 112/78  Pulse: 85  Temp: 98.9 F (37.2 C)  Weight: 215 lb (97.5 kg)  Height: 5' 3.4" (1.61 m)   Body mass index is 37.61 kg/m.  Wt Readings from Last 3 Encounters:  07/20/21 215 lb (97.5 kg)  03/18/21 207 lb 12.8 oz (94.3 kg)  10/13/20 209 lb 12.8 oz (95.2 kg)    BP Readings from Last 3 Encounters:  07/20/21 112/78  03/18/21 114/82  10/19/20 110/70    Objective:  Physical Exam Vitals and nursing note reviewed.  Constitutional:      Appearance: Normal appearance. She is obese.  HENT:     Head: Normocephalic and atraumatic.     Nose:     Comments: Masked     Mouth/Throat:     Comments: Masked  Eyes:     Extraocular Movements: Extraocular movements intact.  Cardiovascular:     Rate and Rhythm: Normal rate and regular rhythm.     Heart sounds: Normal heart sounds.  Pulmonary:     Effort: Pulmonary effort is normal.     Breath sounds: Normal breath sounds.  Musculoskeletal:     Cervical back: Normal range of motion.  Skin:    General: Skin is warm.  Neurological:     General: No focal deficit present.     Mental Status: She is alert.  Psychiatric:        Mood and Affect: Mood normal.        Behavior: Behavior normal.         Assessment And Plan:     1. Uncontrolled type 2 diabetes mellitus with hyperglycemia (HCC) Comments: Chronic, I will check labs as listed below. Encouraged to limit her intake of sweetened beverages, including diet drinks.  - Hemoglobin A1c - CMP14+EGFR  2. Essential hypertension, benign Comments: Chronic, well controlled. Encouraged to follow low sodium diet.  She will f/u in Jan 2023 for her next physical examination.   3. Need for vaccination Comments: She was given flu vaccine.  I will send Prevnar-20 to her local pharmacy.  - Flu Vaccine QUAD 6+ mos PF IM (Fluarix Quad PF)  4. Class 2 severe obesity due to excess calories with serious comorbidity and body  mass index (BMI) of 37.0 to 37.9 in adult [facility])  She iwas made aware of 8lb weight gain since June 2022. She is encouraged to strive for BMI less than 30 to decrease cardiac risk. Advised to aim for at least 150 minutes of exercise per week.   Patient was given opportunity to ask questions. Patient verbalized understanding of the plan and was able to repeat key elements of the plan. All questions were answered to their satisfaction.   I, [name], MD, have reviewed all documentation for this visit. The documentation on 07/20/21 for the exam, diagnosis, procedures, and orders are all accurate and complete.   IF YOU HAVE BEEN REFERRED TO A SPECIALIST, IT MAY TAKE 1-2 WEEKS TO SCHEDULE/PROCESS THE REFERRAL. IF YOU HAVE NOT HEARD FROM US/SPECIALIST IN TWO WEEKS, PLEASE GIVE [place] A CALL AT [phone].   THE PATIENT IS ENCOURAGED TO PRACTICE SOCIAL DISTANCING DUE TO THE COVID-19 PANDEMIC.

## 2021-09-30 ENCOUNTER — Encounter: Payer: Self-pay | Admitting: Nurse Practitioner

## 2021-09-30 ENCOUNTER — Other Ambulatory Visit: Payer: Self-pay

## 2021-09-30 ENCOUNTER — Ambulatory Visit (INDEPENDENT_AMBULATORY_CARE_PROVIDER_SITE_OTHER): Admitting: Nurse Practitioner

## 2021-09-30 DIAGNOSIS — R197 Diarrhea, unspecified: Secondary | ICD-10-CM | POA: Diagnosis not present

## 2021-09-30 LAB — CMP14+EGFR
ALT: 16 IU/L (ref 0–32)
AST: 15 IU/L (ref 0–40)
Albumin/Globulin Ratio: 1.3 (ref 1.2–2.2)
Albumin: 4.1 g/dL (ref 3.8–4.8)
Alkaline Phosphatase: 105 IU/L (ref 44–121)
BUN/Creatinine Ratio: 11 (ref 9–23)
BUN: 8 mg/dL (ref 6–24)
Bilirubin Total: <0.2 mg/dL (ref 0.0–1.2)
CO2: 25 mmol/L (ref 20–29)
Calcium: 9.5 mg/dL (ref 8.7–10.2)
Chloride: 99 mmol/L (ref 96–106)
Creatinine, Ser: 0.71 mg/dL (ref 0.57–1.00)
Globulin, Total: 3.2 g/dL (ref 1.5–4.5)
Glucose: 192 mg/dL — ABNORMAL HIGH (ref 70–99)
Potassium: 3.5 mmol/L (ref 3.5–5.2)
Sodium: 138 mmol/L (ref 134–144)
Total Protein: 7.3 g/dL (ref 6.0–8.5)
eGFR: 107 mL/min/{1.73_m2} (ref >59)

## 2021-09-30 LAB — CBC WITH DIFFERENTIAL/PLATELET
Basophils Absolute: 0 10*3/uL (ref 0.0–0.2)
Basos: 0 %
EOS (ABSOLUTE): 0.1 10*3/uL (ref 0.0–0.4)
Eos: 2 %
Hematocrit: 32.6 % — ABNORMAL LOW (ref 34.0–46.6)
Hemoglobin: 10.3 g/dL — ABNORMAL LOW (ref 11.1–15.9)
Immature Grans (Abs): 0 10*3/uL (ref 0.0–0.1)
Immature Granulocytes: 0 %
Lymphocytes Absolute: 2.2 10*3/uL (ref 0.7–3.1)
Lymphs: 34 %
MCH: 25.1 pg — ABNORMAL LOW (ref 26.6–33.0)
MCHC: 31.6 g/dL (ref 31.5–35.7)
MCV: 79 fL (ref 79–97)
Monocytes Absolute: 0.5 10*3/uL (ref 0.1–0.9)
Monocytes: 7 %
Neutrophils Absolute: 3.7 10*3/uL (ref 1.4–7.0)
Neutrophils: 57 %
Platelets: 557 10*3/uL — ABNORMAL HIGH (ref 150–450)
RBC: 4.11 x10E6/uL (ref 3.77–5.28)
RDW: 14.2 % (ref 11.7–15.4)
WBC: 6.5 10*3/uL (ref 3.4–10.8)

## 2021-09-30 NOTE — Patient Instructions (Signed)
Diarrhea, Adult °Diarrhea is when you pass loose and watery poop (stool) often. Diarrhea can make you feel weak and cause you to lose water in your body (get dehydrated). Losing water in your body can cause you to: °Feel tired and thirsty. °Have a dry mouth. °Go pee (urinate) less often. °Diarrhea often lasts 2-3 days. However, it can last longer if it is a sign of something more serious. It is important to treat your diarrhea as told by your doctor. °Follow these instructions at home: °Eating and drinking °  °Follow these instructions as told by your doctor: °Take an ORS (oral rehydration solution). This is a drink that helps you replace fluids and minerals your body lost. It is sold at pharmacies and stores. °Drink plenty of fluids, such as: °Water. °Ice chips. °Diluted fruit juice. °Low-calorie sports drinks. °Milk, if you want. °Avoid drinking fluids that have a lot of sugar or caffeine in them. °Eat bland, easy-to-digest foods in small amounts as you are able. These foods include: °Bananas. °Applesauce. °Rice. °Low-fat (lean) meats. °Toast. °Crackers. °Avoid alcohol. °Avoid spicy or fatty foods. ° °Medicines °Take over-the-counter and prescription medicines only as told by your doctor. °If you were prescribed an antibiotic medicine, take it as told by your doctor. Do not stop using the antibiotic even if you start to feel better. °General instructions ° °Wash your hands often using soap and water. If soap and water are not available, use a hand sanitizer. Others in your home should wash their hands as well. Hands should be washed: °After using the toilet or changing a diaper. °Before preparing, cooking, or serving food. °While caring for a sick person. °While visiting someone in a hospital. °Drink enough fluid to keep your pee (urine) pale yellow. °Rest at home while you get better. °Take a warm bath to help with any burning or pain from having diarrhea. °Watch your condition for any changes. °Keep all  follow-up visits as told by your doctor. This is important. °Contact a doctor if: °You have a fever. °Your diarrhea gets worse. °You have new symptoms. °You cannot keep fluids down. °You feel light-headed or dizzy. °You have a headache. °You have muscle cramps. °Get help right away if: °You have chest pain. °You feel very weak or you pass out (faint). °You have bloody or black poop or poop that looks like tar. °You have very bad pain, cramping, or bloating in your belly (abdomen). °You have trouble breathing or you are breathing very quickly. °Your heart is beating very quickly. °Your skin feels cold and clammy. °You feel confused. °You have signs of losing too much water in your body, such as: °Dark pee, very little pee, or no pee. °Cracked lips. °Dry mouth. °Sunken eyes. °Sleepiness. °Weakness. °Summary °Diarrhea is when you pass loose and watery poop (stool) often. °Diarrhea can make you feel weak and cause you to lose water in your body (get dehydrated). °Take an ORS (oral rehydration solution). This is a drink that is sold at pharmacies and stores. °Eat bland, easy-to-digest foods in small amounts as you are able. °Contact a doctor if your condition gets worse. Get help right away if you have signs that you have lost too much water in your body. °This information is not intended to replace advice given to you by your health care provider. Make sure you discuss any questions you have with your health care provider. °Document Revised: 03/18/2021 Document Reviewed: 03/18/2021 °Elsevier Patient Education © 2022 [employer] ° °

## 2021-09-30 NOTE — Progress Notes (Signed)
Virtual Visit via video visit    This visit type was conducted due to national recommendations for restrictions regarding the COVID-19 Pandemic (e.g. social distancing) in an effort to limit this patient's exposure and mitigate transmission in our community.  Due to her co-morbid illnesses, this patient is at least at moderate risk for complications without adequate follow up.  This format is felt to be most appropriate for this patient at this time.  All issues noted in this document were discussed and addressed.  A limited physical exam was performed with this format.    This visit type was conducted due to national recommendations for restrictions regarding the COVID-19 Pandemic (e.g. social distancing) in an effort to limit this patient's exposure and mitigate transmission in our community.  Patients identity confirmed using two different identifiers.  This format is felt to be most appropriate for this patient at this time.  All issues noted in this document were discussed and addressed.  No physical exam was performed (except for noted visual exam findings with Video Visits).    Date:  09/30/2021   ID:  Gabrielle Perkins, DOB 13-Jul-1976, MRN [id number]  Patient Location:  Home  Provider location:   Office  Chief Complaint:  Diarrhea   History of Present Illness: Diarrhea    Gabrielle Perkins is a 46 y.o. female who presents via video conferencing for a telehealth visit today.    The patient does not have symptoms concerning for COVID-19 infection (fever, chills, cough, or new shortness of breath).   Patient presents today for treatment for diarrhea. She states that she has had this problem since Sunday. She is going 6-7 times. No blood in the stool.  No fever. Sunday she had zaxbys. She had chicken fingers. She had some chicken fingers from [facility].No other symptoms she will come in today for lab work and to pick the kit for stool samples   Diarrhea  Pertinent negatives include no  abdominal pain, chills, coughing, fever, headaches or vomiting. She has tried change of diet and increased fluids for the symptoms. The treatment provided moderate relief.    Past Medical History:  Diagnosis Date   Diabetes mellitus without complication ([place])    GERD (gastroesophageal reflux disease)    Hypertension    Past Surgical History:  Procedure Laterality Date   btl     LEFT HEART CATH AND CORONARY ANGIOGRAPHY N/A 09/12/2017   Procedure: LEFT HEART CATH AND CORONARY ANGIOGRAPHY;  Surgeon: Dixie Dials, MD;  Location: [place] [facility];  Service: Cardiovascular;  Laterality: N/A;   LIPOMA EXCISION N/A 10/04/2019   Procedure: EXCISION OF FOREHEAD LIPOMA;  Surgeon: Cindra Presume, MD;  Location: [facility];  Service: Plastics;  Laterality: N/A;  45 min, please   TUBAL LIGATION       Current Meds  Medication Sig   acetaminophen (TYLENOL) 500 MG tablet Take 500 mg by mouth every 6 (six) hours as needed.   amLODipine (NORVASC) 5 MG tablet Take 1 tablet (5 mg total) by mouth daily.   aspirin EC 81 MG tablet Take 1 tablet (81 mg total) by mouth daily.   Blood Glucose Monitoring Suppl (FREESTYLE FREEDOM LITE) w/Device KIT Use as directed to check blood sugars 2 times per day dx: e11.22   cetirizine (ZYRTEC) 10 MG tablet Take 1 tablet (10 mg total) by mouth daily.   fluticasone (FLONASE) 50 MCG/ACT nasal spray Place 2 sprays into both nostrils daily.   glucose blood (FREESTYLE  LITE) test strip Use as instructed to check blood sugars 2 times per day dx: e11.22   Iron-FA-B Cmp-C-Biot-Probiotic (FUSION PLUS) CAPS One capsule po qd   levonorgestrel (MIRENA, 52 MG,) 20 MCG/DAY IUD Mirena 20 mcg/24 hours (7 yrs) 52 mg intrauterine device  Take 1 device by intrauterine route.   metFORMIN (GLUCOPHAGE-XR) 500 MG 24 hr tablet Take 2 tablets (1,000 mg total) by mouth daily with supper.   metoprolol succinate (TOPROL XL) 50 MG 24 hr tablet Take 1 tablet (50 mg total) by mouth daily.    Microlet Lancets MISC Use as directed to check blood sugars 2 times per day dx: e11.22   omeprazole (PRILOSEC) 40 MG capsule Take 1 capsule (40 mg total) by mouth daily.   telmisartan-hydrochlorothiazide (MICARDIS HCT) 80-25 MG tablet Take 1 tablet by mouth daily.     Allergies:   Patient has no known allergies.   Social History   Tobacco Use   Smoking status: Never   Smokeless tobacco: Never  Vaping Use   Vaping Use: Never used  Substance Use Topics   Alcohol use: No   Drug use: Never     Family Hx: The patient's family history includes Coronary artery disease in her maternal grandmother; Diabetes in her father and mother; Hypertension in her father and mother; Sarcoidosis in her father. There is no history of Breast cancer.  ROS:   Please see the history of present illness.    Review of Systems  Constitutional: Negative.  Negative for chills and fever.  HENT:  Negative for congestion.   Respiratory: Negative.  Negative for cough and shortness of breath.   Cardiovascular: Negative.  Negative for chest pain.  Gastrointestinal:  Positive for diarrhea. Negative for abdominal pain, blood in stool, nausea and vomiting.  Neurological: Negative.  Negative for dizziness, weakness and headaches.  Psychiatric/Behavioral: Negative.     All other systems reviewed and are negative.   Labs/Other Tests and Data Reviewed:    Recent Labs: 03/18/2021: Hemoglobin 9.8; Platelets 398 07/20/2021: ALT 17; BUN 8; Creatinine, Ser 0.78; Potassium 4.2; Sodium 138   Recent Lipid Panel Lab Results  Component Value Date/Time   CHOL 136 10/13/2020 12:16 PM   TRIG 118 10/13/2020 12:16 PM   HDL 43 10/13/2020 12:16 PM   CHOLHDL 3.2 10/13/2020 12:16 PM   CHOLHDL 3.2 09/09/2017 05:39 AM   LDLCALC 72 10/13/2020 12:16 PM    Wt Readings from Last 3 Encounters:  07/20/21 215 lb (97.5 kg)  03/18/21 207 lb 12.8 oz (94.3 kg)  10/13/20 209 lb 12.8 oz (95.2 kg)     Exam:    Vital Signs:  There were  no vitals taken for this visit.    Physical Exam Vitals and nursing note reviewed.  HENT:     Head: Normocephalic and atraumatic.  Cardiovascular:     Rate and Rhythm: Normal rate and regular rhythm.     Pulses: Normal pulses.     Heart sounds: Normal heart sounds. No murmur heard. Pulmonary:     Effort: Pulmonary effort is normal. No respiratory distress.     Breath sounds: Normal breath sounds. No wheezing.  Skin:    General: Skin is warm and dry.     Capillary Refill: Capillary refill takes less than 2 seconds.  Neurological:     Mental Status: She is alert and oriented to person, place, and time.  Psychiatric:        Mood and Affect: Affect normal.    ASSESSMENT &  PLAN:    1. Diarrhea, unspecified type -Started Sunday after eating zaxby's -Advised patient to drink plenty of water and stay hydrated.  -BRAT diet  -OTC imodium  -Probiotic  -Sample of [place] given to patient  -Advised patient if her symptoms do get better or get worse, to go to the urgent care or ED  - Ova and parasite examination - CBC with Diff - Culture, Stool - CMP14+EGFR  The patient was encouraged to call or send a message through [place] for any questions or concerns.   Follow up: if symptoms persist or do not get better.   Side effects and appropriate use of all the medication(s) were discussed with the patient today. Patient advised to use the medication(s) as directed by their healthcare provider. The patient was encouraged to read, review, and understand all associated package inserts and contact our office with any questions or concerns. The patient accepts the risks of the treatment plan and had an opportunity to ask questions.   Patient was given opportunity to ask questions. Patient verbalized understanding of the plan and was able to repeat key elements of the plan. All questions were answered to their satisfaction.  Raman Hector Taft, DNP   I, Raman Sharissa Brierley have reviewed all documentation  for this visit. The documentation on 09/30/20 for the exam, diagnosis, procedures, and orders are all accurate and complete.   COVID-19 Education: The signs and symptoms of COVID-19 were discussed with the patient and how to seek care for testing (follow up with PCP or arrange E-visit).  The importance of social distancing was discussed today.  Patient Risk:   After full review of this patients clinical status, I feel that they are at least moderate risk at this time.  Time:   Today, I have spent 10 minutes/ seconds with the patient with telehealth technology discussing above diagnoses.     Medication Adjustments/Labs and Tests Ordered: Current medicines are reviewed at length with the patient today.  Concerns regarding medicines are outlined above.   Tests Ordered: Orders Placed This Encounter  Procedures   Ova and parasite examination   Culture, Stool   CBC with Diff   CMP14+EGFR    Medication Changes: No orders of the defined types were placed in this encounter.   Disposition:  Follow up prn  Signed, Bary Castilla, NP

## 2021-10-01 ENCOUNTER — Other Ambulatory Visit: Payer: Self-pay | Admitting: Nurse Practitioner

## 2021-10-01 DIAGNOSIS — D500 Iron deficiency anemia secondary to blood loss (chronic): Secondary | ICD-10-CM

## 2021-10-05 LAB — OVA AND PARASITE EXAMINATION

## 2021-10-05 LAB — STOOL CULTURE: E coli, Shiga toxin Assay: NEGATIVE

## 2021-10-06 NOTE — Progress Notes (Signed)
Yes that is fine

## 2021-10-12 ENCOUNTER — Other Ambulatory Visit

## 2021-10-12 ENCOUNTER — Other Ambulatory Visit: Payer: Self-pay

## 2021-10-12 DIAGNOSIS — D500 Iron deficiency anemia secondary to blood loss (chronic): Secondary | ICD-10-CM

## 2021-10-13 LAB — IRON,TIBC AND FERRITIN PANEL
Ferritin: 32 ng/mL (ref 15–150)
Iron Saturation: 11 % — ABNORMAL LOW (ref 15–55)
Iron: 43 ug/dL (ref 27–159)
Total Iron Binding Capacity: 391 ug/dL (ref 250–450)
UIBC: 348 ug/dL (ref 131–425)

## 2021-10-15 ENCOUNTER — Ambulatory Visit (INDEPENDENT_AMBULATORY_CARE_PROVIDER_SITE_OTHER): Admitting: Internal Medicine

## 2021-10-15 ENCOUNTER — Other Ambulatory Visit: Payer: Self-pay

## 2021-10-15 ENCOUNTER — Encounter: Payer: Self-pay | Admitting: Internal Medicine

## 2021-10-15 VITALS — BP 120/70 | HR 76 | Temp 98.2°F | Ht 63.4 in | Wt 220.0 lb

## 2021-10-15 DIAGNOSIS — Z6838 Body mass index (BMI) 38.0-38.9, adult: Secondary | ICD-10-CM

## 2021-10-15 DIAGNOSIS — D75839 Thrombocytosis, unspecified: Secondary | ICD-10-CM | POA: Diagnosis not present

## 2021-10-15 DIAGNOSIS — E6609 Other obesity due to excess calories: Secondary | ICD-10-CM

## 2021-10-15 DIAGNOSIS — R002 Palpitations: Secondary | ICD-10-CM

## 2021-10-15 DIAGNOSIS — E1165 Type 2 diabetes mellitus with hyperglycemia: Secondary | ICD-10-CM | POA: Diagnosis not present

## 2021-10-15 DIAGNOSIS — I10 Essential (primary) hypertension: Secondary | ICD-10-CM | POA: Diagnosis not present

## 2021-10-15 DIAGNOSIS — Z0000 Encounter for general adult medical examination without abnormal findings: Secondary | ICD-10-CM

## 2021-10-15 LAB — POCT URINALYSIS DIPSTICK
Bilirubin, UA: NEGATIVE
Blood, UA: NEGATIVE
Glucose, UA: NEGATIVE
Ketones, UA: NEGATIVE
Leukocytes, UA: NEGATIVE
Nitrite, UA: NEGATIVE
Protein, UA: NEGATIVE
Spec Grav, UA: 1.01
Urobilinogen, UA: 0.2 U/dL
pH, UA: 6

## 2021-10-15 NOTE — Progress Notes (Signed)
Gabrielle Perkins,acting as a Education administrator for Gabrielle Greenland, MD.,have documented all relevant documentation on the behalf of Gabrielle Greenland, MD,as directed by  Gabrielle Greenland, MD while in the presence of Gabrielle Greenland, MD.  This visit occurred during the SARS-CoV-2 public health emergency.  Safety protocols were in place, including screening questions prior to the visit, additional usage of staff PPE, and extensive cleaning of exam room while observing appropriate contact time as indicated for disinfecting solutions.  Subjective:     Patient ID: Gabrielle Perkins , female    DOB: Jul 04, 1976 , 46 y.o.   MRN: [id number]   Chief Complaint  Patient presents with   Annual Exam    HPI  She is here today for a full physical examination.  She is followed by Dr. Cletis Perkins for her pelvic exams. Her last visit was 10/12/2021.  She was seen at her new facility. She denies headaches, chest pain and shortness of breath. She has no specific concerns or complaints at this time.   Diabetes She presents for her follow-up diabetic visit. She has type 2 diabetes mellitus. There are no hypoglycemic associated symptoms. There are no diabetic associated symptoms. There are no hypoglycemic complications. Risk factors for coronary artery disease include diabetes mellitus, dyslipidemia, hypertension, obesity and sedentary lifestyle. She participates in exercise intermittently. Eye exam is current.  Hypertension This is a chronic problem. The current episode started more than 1 year ago. The problem has been gradually improving since onset. The problem is controlled. Associated symptoms include palpitations. Risk factors for coronary artery disease include diabetes mellitus, obesity and sedentary lifestyle. Past treatments include angiotensin blockers and diuretics. The current treatment provides moderate improvement. Compliance problems include exercise.     Past Medical History:  Diagnosis Date   Diabetes mellitus  without complication (HCC)    GERD (gastroesophageal reflux disease)    Hypertension      Family History  Problem Relation Age of Onset   Coronary artery disease Maternal Grandmother    Hypertension Mother    Diabetes Mother    Hypertension Father    Diabetes Father    Sarcoidosis Father    Breast cancer Neg Hx      Current Outpatient Medications:    acetaminophen (TYLENOL) 500 MG tablet, Take 500 mg by mouth every 6 (six) hours as needed., Disp: , Rfl:    amLODipine (NORVASC) 5 MG tablet, Take 1 tablet (5 mg total) by mouth daily., Disp: 90 tablet, Rfl: 2   aspirin EC 81 MG tablet, Take 1 tablet (81 mg total) by mouth daily., Disp: 90 tablet, Rfl: 2   Blood Glucose Monitoring Suppl (FREESTYLE FREEDOM LITE) w/Device KIT, Use as directed to check blood sugars 2 times per day dx: e11.22, Disp: 1 kit, Rfl: 1   cetirizine (ZYRTEC) 10 MG tablet, Take 1 tablet (10 mg total) by mouth daily., Disp: 90 tablet, Rfl: 2   fluticasone (FLONASE) 50 MCG/ACT nasal spray, Place 2 sprays into both nostrils daily., Disp: 16 g, Rfl: 2   glucose blood (FREESTYLE LITE) test strip, Use as instructed to check blood sugars 2 times per day dx: e11.22, Disp: 150 each, Rfl: 2   Iron-FA-B Cmp-C-Biot-Probiotic (FUSION PLUS) CAPS, One capsule po qd, Disp: 90 capsule, Rfl: 3   levonorgestrel (MIRENA, 52 MG,) 20 MCG/DAY IUD, Mirena 20 mcg/24 hours (7 yrs) 52 mg intrauterine device  Take 1 device by intrauterine route., Disp: , Rfl:    metFORMIN (GLUCOPHAGE-XR) 500 MG 24 hr  tablet, Take 2 tablets (1,000 mg total) by mouth daily with supper., Disp: 90 tablet, Rfl: 2   metoprolol succinate (TOPROL XL) 50 MG 24 hr tablet, Take 1 tablet (50 mg total) by mouth daily., Disp: 90 tablet, Rfl: 2   Microlet Lancets MISC, Use as directed to check blood sugars 2 times per day dx: e11.22, Disp: 150 each, Rfl: 2   omeprazole (PRILOSEC) 40 MG capsule, Take 1 capsule (40 mg total) by mouth daily., Disp: 90 capsule, Rfl: 2    telmisartan-hydrochlorothiazide (MICARDIS HCT) 80-25 MG tablet, Take 1 tablet by mouth daily., Disp: 90 tablet, Rfl: 2   No Known Allergies    The patient states she uses tubal ligation for birth control. Last LMP was Patient's last menstrual period was 10/02/2021.. Negative for Dysmenorrhea. Negative for: breast discharge, breast lump(s), breast pain and breast self exam. Associated symptoms include abnormal vaginal bleeding. Pertinent negatives include abnormal bleeding (hematology), anxiety, decreased libido, depression, difficulty falling sleep, dyspareunia, history of infertility, nocturia, sexual dysfunction, sleep disturbances, urinary incontinence, urinary urgency, vaginal discharge and vaginal itching. Diet regular.The patient states her exercise level is    . The patient's tobacco use is:  Social History   Tobacco Use  Smoking Status Never  Smokeless Tobacco Never  . She has been exposed to passive smoke. The patient's alcohol use is:  Social History   Substance and Sexual Activity  Alcohol Use No   Review of Systems  Constitutional: Negative.   HENT: Negative.    Eyes: Negative.   Respiratory: Negative.    Cardiovascular:  Positive for palpitations.       She states she has been having episodes of her heart racing. She feels like it pauses, then speeds up. She has not been able to determine what triggers her sx.  She denies having any associated chest pain/shortness of breath.   Endocrine: Negative.   Genitourinary: Negative.   Musculoskeletal: Negative.   Skin: Negative.   Allergic/Immunologic: Negative.   Neurological: Negative.   Hematological: Negative.        She is concerned about her elevated platelet count. She wants to see specialist. She recently had labs here and with another provider. She wants to be sure lab abnormality is not related to any cancer.   Psychiatric/Behavioral: Negative.      Today's Vitals   10/15/21 1134  BP: 120/70  Pulse: 76  Temp: 98.2  F (36.8 C)  Weight: 220 lb (99.8 kg)  Height: 5' 3.4" (1.61 m)  PainSc: 7   PainLoc: Back   Body mass index is 38.48 kg/m.  Wt Readings from Last 3 Encounters:  10/15/21 220 lb (99.8 kg)  07/20/21 215 lb (97.5 kg)  03/18/21 207 lb 12.8 oz (94.3 kg)     Objective:  Physical Exam Vitals and nursing note reviewed.  Constitutional:      Appearance: Normal appearance. She is obese.  HENT:     Head: Normocephalic and atraumatic.     Right Ear: Tympanic membrane, ear canal and external ear normal.     Left Ear: Tympanic membrane, ear canal and external ear normal.     Nose:     Comments: Masked     Mouth/Throat:     Comments: Masked  Eyes:     Extraocular Movements: Extraocular movements intact.     Conjunctiva/sclera: Conjunctivae normal.     Pupils: Pupils are equal, round, and reactive to light.  Cardiovascular:     Rate and Rhythm: Normal rate and  regular rhythm.     Pulses: Normal pulses.          Dorsalis pedis pulses are 2+ on the right side and 2+ on the left side.     Heart sounds: Normal heart sounds.  Pulmonary:     Effort: Pulmonary effort is normal.     Breath sounds: Normal breath sounds.  Chest:  Breasts:    Tanner Score is 5.     Right: Normal.     Left: Normal.  Abdominal:     General: Bowel sounds are normal.     Palpations: Abdomen is soft.     Comments: Obese, soft  Genitourinary:    Comments: deferred Musculoskeletal:        General: Normal range of motion.     Cervical back: Normal range of motion and neck supple.  Feet:     Right foot:     Protective Sensation: 5 sites tested.  5 sites sensed.     Skin integrity: Skin integrity normal.     Toenail Condition: Right toenails are normal.     Left foot:     Protective Sensation: 5 sites tested.  5 sites sensed.     Skin integrity: Skin integrity normal.     Toenail Condition: Left toenails are normal.  Skin:    General: Skin is warm and dry.  Neurological:     General: No focal deficit  present.     Mental Status: She is alert and oriented to person, place, and time.  Psychiatric:        Mood and Affect: Mood normal.        Behavior: Behavior normal.     Assessment And Plan:     1. Encounter for general adult medical examination w/o abnormal findings Comments: A full exam was performed. Importance of monthly self breast exams was discussed with the patient. PATIENT IS ADVISED TO GET 30-45 MINUTES REGULAR EXERCISE NO LESS THAN FOUR TO FIVE DAYS PER WEEK - BOTH WEIGHTBEARING EXERCISES AND AEROBIC ARE RECOMMENDED.  PATIENT IS ADVISED TO FOLLOW A HEALTHY DIET WITH AT LEAST SIX FRUITS/VEGGIES PER DAY, DECREASE INTAKE OF RED MEAT, AND TO INCREASE FISH INTAKE TO TWO DAYS PER WEEK.  MEATS/FISH SHOULD NOT BE FRIED, BAKED OR BROILED IS PREFERABLE.  IT IS ALSO IMPORTANT TO CUT BACK ON YOUR SUGAR INTAKE. PLEASE AVOID ANYTHING WITH ADDED SUGAR, CORN SYRUP OR OTHER SWEETENERS. IF YOU MUST USE A SWEETENER, YOU CAN TRY STEVIA. IT IS ALSO IMPORTANT TO AVOID ARTIFICIALLY SWEETENERS AND DIET BEVERAGES. LASTLY, I SUGGEST WEARING SPF 50 SUNSCREEN ON EXPOSED PARTS AND ESPECIALLY WHEN IN THE DIRECT SUNLIGHT FOR AN EXTENDED PERIOD OF TIME.  PLEASE AVOID FAST FOOD RESTAURANTS AND INCREASE YOUR WATER INTAKE.  2. Uncontrolled type 2 diabetes mellitus with hyperglycemia ([place]) Comments: Diabetic foot exam was performed. She will rto in 4 months for re-evaluation. I DISCUSSED WITH THE PATIENT AT LENGTH REGARDING THE GOALS OF GLYCEMIC CONTROL AND POSSIBLE LONG-TERM COMPLICATIONS.  I  ALSO STRESSED THE IMPORTANCE OF COMPLIANCE WITH HOME GLUCOSE MONITORING, DIETARY RESTRICTIONS INCLUDING AVOIDANCE OF SUGARY DRINKS/PROCESSED FOODS,  ALONG WITH REGULAR EXERCISE.  I  ALSO STRESSED THE IMPORTANCE OF ANNUAL EYE EXAMS, SELF FOOT CARE AND COMPLIANCE WITH OFFICE VISITS.  - POCT Urinalysis Dipstick (81002) - Microalbumin / Creatinine Urine Ratio - CMP14+EGFR - Lipid panel - Hemoglobin A1c  3. Essential hypertension,  benign Comments: Chronic, well controlled. 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 She is encouraged to follow low  sodium diet. She will rto in 4 months for re-evaluation.  - EKG 12-Lead - CMP14+EGFR  4. Palpitations Comments: TSH 0.33m at GYN office. I will check magnesium level.  I will refer her to Cardiology for further evaluation.  - Ambulatory referral to Cardiology - Magnesium  5. Thrombocytosis Comments: I will refer her to Hematology for further evaluation. I will also check vitamin B12 level.  - Ambulatory referral to Hematology / Oncology - Vitamin B12  6. Class 2 obesity due to excess calories with body mass index (BMI) of 38.0 to 38.9 in adult, unspecified whether serious comorbidity present He is encouraged to initially strive for BMI less than 30 to decrease cardiac risk. He is advised to exercise no less than 150 minutes per week.   Patient was given opportunity to ask questions. Patient verbalized understanding of the plan and was able to repeat key elements of the plan. All questions were answered to their satisfaction.   I, RMaximino Greenland MD, have reviewed all documentation for this visit. The documentation on 10/15/21 for the exam, diagnosis, procedures, and orders are all accurate and complete.   THE PATIENT IS ENCOURAGED TO PRACTICE SOCIAL DISTANCING DUE TO THE COVID-19 PANDEMIC.

## 2021-10-15 NOTE — Patient Instructions (Addendum)
Reactive Thrombocytosis Reactive thrombocytosis is condition in which a person has too many platelets (thrombocytes) in the blood. Platelets are parts of blood that stick together and form a clot (thrombus) to help the body stop bleeding after an injury. Conditions that cause inflammation, such as cancer, may trigger your body to make more platelets than normal. This condition may also be called secondary thrombocytosis. What are the causes? This condition may be caused by: Having your spleen surgically removed (splenectomy). An injury or severe bleeding. Certain infections. Low red blood cell count from not having enough iron (iron deficiency anemia). Having a disease that destroys your red blood cells (hemolytic anemia). Not having enough vitamin B12 in your body. Inflammatory bowel disease (Crohn's disease or ulcerative colitis). Cancer, especially lymphoma, breast, stomach, and ovarian cancers. Other causes of this condition include alcohol misuse and taking certain medicines. What are the signs or symptoms? It can be hard to tell the difference between symptoms of reactive thrombocytosis and symptoms of the underlying condition causing it. Symptoms of this condition may include: Weakness. Headache. Dizziness or confusion. Chest pain or shortness of breath. Tingling or burning in your hands or feet. How is this diagnosed? This condition may be diagnosed based on routine blood tests or while you are being evaluated for another condition. You may need tests to confirm the diagnosis. These may include: More blood tests. A procedure to collect a sample of bone marrow (bone marrow aspiration). How is this treated? Treatment for this condition depends on the cause. Your platelet count may return to normal after the underlying cause is treated. If your platelet count is very high, you may have to take medicine to prevent blood clots as told by your health care provider. Follow these  instructions at home:  Take over-the-counter and prescription medicines only as told by your health care provider. Work with your health care provider to: Treat the condition that is causing reactive thrombocytosis. Control any other conditions you may have, such as high blood pressure, high cholesterol, and diabetes. Do not use any products that contain nicotine or tobacco. These products include cigarettes, chewing tobacco, and vaping devices, such as e-cigarettes. If you need help quitting, ask your health care provider. Keep all follow-up visits. This is important. Contact a health care provider if: You have a severe headache, and medicines do not help. You faint. Get help right away if: You have chest pain. You have trouble breathing. You have any symptoms of a stroke. "BE FAST" is an easy way to remember the main warning signs of a stroke: B - Balance. Signs are dizziness, sudden trouble walking, or loss of balance. E - Eyes. Signs are trouble seeing or a sudden change in vision. F - Face. Signs are sudden weakness or numbness of the face, or the face or eyelid drooping on one side. A - Arm. Signs are weakness or numbness in an arm. This happens suddenly and usually on one side of the body. S - Speech. Signs are sudden trouble speaking, slurred speech, or trouble understanding what people say. T - Time. Time to call emergency services. Write down what time symptoms started. You have other signs of a stroke, such as: A sudden, severe headache with no known cause. Nausea or vomiting. Seizure. These symptoms may represent a serious problem that is an emergency. Do not wait to see if the symptoms will go away. Get medical help right away. Call your local emergency services (911 in the [place] Do not drive  yourself to the hospital. Summary Reactive thrombocytosis is when you have too many platelets (thrombocytes) in your blood. Platelets help your body stop bleeding. Conditions that  cause inflammation may trigger your body to make more platelets than normal. Treatment for this condition depends on the cause. This information is not intended to replace advice given to you by your health care provider. Make sure you discuss any questions you have with your health care provider. Document Revised: 03/09/2021 Document Reviewed: 03/09/2021 Elsevier Patient Education  [employer] Maintenance, Female Adopting a healthy lifestyle and getting preventive care are important in promoting health and wellness. Ask your health care provider about: The right schedule for you to have regular tests and exams. Things you can do on your own to prevent diseases and keep yourself healthy. What should I know about diet, weight, and exercise? Eat a healthy diet  Eat a diet that includes plenty of vegetables, fruits, low-fat dairy products, and lean protein. Do not eat a lot of foods that are high in solid fats, added sugars, or sodium. Maintain a healthy weight Body mass index (BMI) is used to identify weight problems. It estimates body fat based on height and weight. Your health care provider can help determine your BMI and help you achieve or maintain a healthy weight. Get regular exercise Get regular exercise. This is one of the most important things you can do for your health. Most adults should: Exercise for at least 150 minutes each week. The exercise should increase your heart rate and make you sweat (moderate-intensity exercise). Do strengthening exercises at least twice a week. This is in addition to the moderate-intensity exercise. Spend less time sitting. Even light physical activity can be beneficial. Watch cholesterol and blood lipids Have your blood tested for lipids and cholesterol at 45 years of age, then have this test every 5 years. Have your cholesterol levels checked more often if: Your lipid or cholesterol levels are high. You are older than 45 years of  age. You are at high risk for heart disease. What should I know about cancer screening? Depending on your health history and family history, you may need to have cancer screening at various ages. This may include screening for: Breast cancer. Cervical cancer. Colorectal cancer. Skin cancer. Lung cancer. What should I know about heart disease, diabetes, and high blood pressure? Blood pressure and heart disease High blood pressure causes heart disease and increases the risk of stroke. This is more likely to develop in people who have high blood pressure readings or are overweight. Have your blood pressure checked: Every 3-5 years if you are 2-70 years of age. Every year if you are 16 years old or older. Diabetes Have regular diabetes screenings. This checks your fasting blood sugar level. Have the screening done: Once every three years after age 22 if you are at a normal weight and have a low risk for diabetes. More often and at a younger age if you are overweight or have a high risk for diabetes. What should I know about preventing infection? Hepatitis B If you have a higher risk for hepatitis B, you should be screened for this virus. Talk with your health care provider to find out if you are at risk for hepatitis B infection. Hepatitis C Testing is recommended for: Everyone born from [birth year] through 1964. Anyone with known risk factors for hepatitis C. Sexually transmitted infections (STIs) Get screened for STIs, including gonorrhea and chlamydia, if: You are sexually  active and are younger than 45 years of age. You are older than 45 years of age and your health care provider tells you that you are at risk for this type of infection. Your sexual activity has changed since you were last screened, and you are at increased risk for chlamydia or gonorrhea. Ask your health care provider if you are at risk. Ask your health care provider about whether you are at high risk for HIV. Your health  care provider may recommend a prescription medicine to help prevent HIV infection. If you choose to take medicine to prevent HIV, you should first get tested for HIV. You should then be tested every 3 months for as long as you are taking the medicine. Pregnancy If you are about to stop having your period (premenopausal) and you may become pregnant, seek counseling before you get pregnant. Take 400 to 800 micrograms (mcg) of folic acid every day if you become pregnant. Ask for birth control (contraception) if you want to prevent pregnancy. Osteoporosis and menopause Osteoporosis is a disease in which the bones lose minerals and strength with aging. This can result in bone fractures. If you are 69 years old or older, or if you are at risk for osteoporosis and fractures, ask your health care provider if you should: Be screened for bone loss. Take a calcium or vitamin D supplement to lower your risk of fractures. Be given hormone replacement therapy (HRT) to treat symptoms of menopause. Follow these instructions at home: Alcohol use Do not drink alcohol if: Your health care provider tells you not to drink. You are pregnant, may be pregnant, or are planning to become pregnant. If you drink alcohol: Limit how much you have to: 0-1 drink a day. Know how much alcohol is in your drink. In the [place] one drink equals one 12 oz bottle of beer (355 mL), one 5 oz glass of wine (148 mL), or one 1 oz glass of hard liquor (44 mL). Lifestyle Do not use any products that contain nicotine or tobacco. These products include cigarettes, chewing tobacco, and vaping devices, such as e-cigarettes. If you need help quitting, ask your health care provider. Do not use street drugs. Do not share needles. Ask your health care provider for help if you need support or information about quitting drugs. General instructions Schedule regular health, dental, and eye exams. Stay current with your vaccines. Tell your health  care provider if: You often feel depressed. You have ever been abused or do not feel safe at home. Summary Adopting a healthy lifestyle and getting preventive care are important in promoting health and wellness. Follow your health care provider's instructions about healthy diet, exercising, and getting tested or screened for diseases. Follow your health care provider's instructions on monitoring your cholesterol and blood pressure. This information is not intended to replace advice given to you by your health care provider. Make sure you discuss any questions you have with your health care provider. Document Revised: 01/26/2021 Document Reviewed: 01/26/2021 Elsevier Patient Education  [place].

## 2021-10-16 LAB — CMP14+EGFR
ALT: 20 IU/L (ref 0–32)
AST: 19 IU/L (ref 0–40)
Albumin/Globulin Ratio: 1.4 (ref 1.2–2.2)
Albumin: 4.2 g/dL (ref 3.8–4.8)
Alkaline Phosphatase: 99 IU/L (ref 44–121)
BUN/Creatinine Ratio: 12 (ref 9–23)
BUN: 10 mg/dL (ref 6–24)
Bilirubin Total: 0.3 mg/dL (ref 0.0–1.2)
CO2: 25 mmol/L (ref 20–29)
Calcium: 9.4 mg/dL (ref 8.7–10.2)
Chloride: 101 mmol/L (ref 96–106)
Creatinine, Ser: 0.82 mg/dL (ref 0.57–1.00)
Globulin, Total: 3.1 g/dL (ref 1.5–4.5)
Glucose: 106 mg/dL — ABNORMAL HIGH (ref 70–99)
Potassium: 3.8 mmol/L (ref 3.5–5.2)
Sodium: 146 mmol/L — ABNORMAL HIGH (ref 134–144)
Total Protein: 7.3 g/dL (ref 6.0–8.5)
eGFR: 90 mL/min/{1.73_m2} (ref >59)

## 2021-10-16 LAB — LIPID PANEL
Chol/HDL Ratio: 2.7 ratio (ref 0.0–4.4)
Cholesterol, Total: 105 mg/dL (ref 100–199)
HDL: 39 mg/dL — ABNORMAL LOW (ref >39)
LDL Chol Calc (NIH): 51 mg/dL (ref 0–99)
Triglycerides: 69 mg/dL (ref 0–149)
VLDL Cholesterol Cal: 15 mg/dL (ref 5–40)

## 2021-10-16 LAB — MICROALBUMIN / CREATININE URINE RATIO
Creatinine, Urine: 49.3 mg/dL
Microalb/Creat Ratio: 16 mg/g creat (ref 0–29)
Microalbumin, Urine: 7.9 ug/mL

## 2021-10-16 LAB — MAGNESIUM: Magnesium: 1.8 mg/dL (ref 1.6–2.3)

## 2021-10-16 LAB — VITAMIN B12: Vitamin B-12: 230 pg/mL — ABNORMAL LOW (ref 232–1245)

## 2021-10-16 LAB — HEMOGLOBIN A1C
Est. average glucose Bld gHb Est-mCnc: 154 mg/dL
Hgb A1c MFr Bld: 7 % — ABNORMAL HIGH (ref 4.8–5.6)

## 2021-10-19 ENCOUNTER — Telehealth: Payer: Self-pay | Admitting: Hematology

## 2021-10-19 NOTE — Telephone Encounter (Signed)
Scheduled appt per 1/26 referral. Spoke to pt who is aware of appt date and time. Pt is aware to arrive 15 mins prior to appt time.

## 2021-10-27 ENCOUNTER — Other Ambulatory Visit: Payer: Self-pay

## 2021-10-27 ENCOUNTER — Ambulatory Visit (INDEPENDENT_AMBULATORY_CARE_PROVIDER_SITE_OTHER)

## 2021-10-27 VITALS — BP 112/70 | HR 92 | Temp 98.1°F | Ht 63.4 in

## 2021-10-27 DIAGNOSIS — E538 Deficiency of other specified B group vitamins: Secondary | ICD-10-CM

## 2021-10-27 MED ORDER — CYANOCOBALAMIN 1000 MCG/ML IJ SOLN
1000.0000 ug | Freq: Once | INTRAMUSCULAR | Status: AC
  Administered 2021-10-27: 1000 ug via INTRAMUSCULAR

## 2021-10-27 NOTE — Progress Notes (Signed)
The patient is here today for her 1st weekly of 4 weekly vitamin b12 injections.

## 2021-11-03 ENCOUNTER — Ambulatory Visit (INDEPENDENT_AMBULATORY_CARE_PROVIDER_SITE_OTHER)

## 2021-11-03 ENCOUNTER — Inpatient Hospital Stay: Attending: Hematology | Admitting: Hematology

## 2021-11-03 ENCOUNTER — Inpatient Hospital Stay

## 2021-11-03 ENCOUNTER — Other Ambulatory Visit: Payer: Self-pay

## 2021-11-03 ENCOUNTER — Encounter: Payer: Self-pay | Admitting: Internal Medicine

## 2021-11-03 VITALS — BP 130/72 | HR 70 | Temp 98.1°F | Ht 63.4 in | Wt 227.8 lb

## 2021-11-03 VITALS — BP 149/92 | HR 69 | Temp 98.2°F | Resp 19 | Ht 63.4 in | Wt 225.6 lb

## 2021-11-03 DIAGNOSIS — E538 Deficiency of other specified B group vitamins: Secondary | ICD-10-CM

## 2021-11-03 DIAGNOSIS — Z833 Family history of diabetes mellitus: Secondary | ICD-10-CM | POA: Diagnosis not present

## 2021-11-03 DIAGNOSIS — I10 Essential (primary) hypertension: Secondary | ICD-10-CM | POA: Diagnosis not present

## 2021-11-03 DIAGNOSIS — N926 Irregular menstruation, unspecified: Secondary | ICD-10-CM | POA: Insufficient documentation

## 2021-11-03 DIAGNOSIS — E119 Type 2 diabetes mellitus without complications: Secondary | ICD-10-CM | POA: Insufficient documentation

## 2021-11-03 DIAGNOSIS — D75839 Thrombocytosis, unspecified: Secondary | ICD-10-CM

## 2021-11-03 DIAGNOSIS — K219 Gastro-esophageal reflux disease without esophagitis: Secondary | ICD-10-CM | POA: Insufficient documentation

## 2021-11-03 DIAGNOSIS — D500 Iron deficiency anemia secondary to blood loss (chronic): Secondary | ICD-10-CM

## 2021-11-03 DIAGNOSIS — D509 Iron deficiency anemia, unspecified: Secondary | ICD-10-CM | POA: Diagnosis not present

## 2021-11-03 DIAGNOSIS — D75838 Other thrombocytosis: Secondary | ICD-10-CM | POA: Diagnosis not present

## 2021-11-03 DIAGNOSIS — Z79899 Other long term (current) drug therapy: Secondary | ICD-10-CM | POA: Insufficient documentation

## 2021-11-03 DIAGNOSIS — Z8249 Family history of ischemic heart disease and other diseases of the circulatory system: Secondary | ICD-10-CM | POA: Diagnosis not present

## 2021-11-03 DIAGNOSIS — R5383 Other fatigue: Secondary | ICD-10-CM | POA: Insufficient documentation

## 2021-11-03 LAB — CBC WITH DIFFERENTIAL/PLATELET
Abs Immature Granulocytes: 0.02 10*3/uL (ref 0.00–0.07)
Basophils Absolute: 0 10*3/uL (ref 0.0–0.1)
Basophils Relative: 1 %
Eosinophils Absolute: 0.1 10*3/uL (ref 0.0–0.5)
Eosinophils Relative: 2 %
HCT: 34.2 % — ABNORMAL LOW (ref 36.0–46.0)
Hemoglobin: 10.3 g/dL — ABNORMAL LOW (ref 12.0–15.0)
Immature Granulocytes: 0 %
Lymphocytes Relative: 49 %
Lymphs Abs: 2.9 10*3/uL (ref 0.7–4.0)
MCH: 24.9 pg — ABNORMAL LOW (ref 26.0–34.0)
MCHC: 30.1 g/dL (ref 30.0–36.0)
MCV: 82.6 fL (ref 80.0–100.0)
Monocytes Absolute: 0.5 10*3/uL (ref 0.1–1.0)
Monocytes Relative: 8 %
Neutro Abs: 2.4 10*3/uL (ref 1.7–7.7)
Neutrophils Relative %: 40 %
Platelets: 511 10*3/uL — ABNORMAL HIGH (ref 150–400)
RBC: 4.14 MIL/uL (ref 3.87–5.11)
RDW: 16 % — ABNORMAL HIGH (ref 11.5–15.5)
WBC: 5.8 10*3/uL (ref 4.0–10.5)
nRBC: 0 % (ref 0.0–0.2)

## 2021-11-03 LAB — CMP (CANCER CENTER ONLY)
ALT: 20 U/L (ref 0–44)
AST: 16 U/L (ref 15–41)
Albumin: 3.9 g/dL (ref 3.5–5.0)
Alkaline Phosphatase: 92 U/L (ref 38–126)
Anion gap: 6 (ref 5–15)
BUN: 10 mg/dL (ref 6–20)
CO2: 27 mmol/L (ref 22–32)
Calcium: 8.9 mg/dL (ref 8.9–10.3)
Chloride: 102 mmol/L (ref 98–111)
Creatinine: 0.65 mg/dL (ref 0.44–1.00)
GFR, Estimated: >60 mL/min (ref >60)
Glucose, Bld: 94 mg/dL (ref 70–99)
Potassium: 3.3 mmol/L — ABNORMAL LOW (ref 3.5–5.1)
Sodium: 135 mmol/L (ref 135–145)
Total Bilirubin: 0.2 mg/dL — ABNORMAL LOW (ref 0.3–1.2)
Total Protein: 8 g/dL (ref 6.5–8.1)

## 2021-11-03 LAB — FERRITIN: Ferritin: 7 ng/mL — ABNORMAL LOW (ref 11–307)

## 2021-11-03 MED ORDER — CYANOCOBALAMIN 1000 MCG/ML IJ SOLN
1000.0000 ug | Freq: Once | INTRAMUSCULAR | Status: AC
  Administered 2021-11-03: 1000 ug via INTRAMUSCULAR

## 2021-11-03 NOTE — Patient Instructions (Signed)
Vitamin B12 Deficiency Vitamin B12 deficiency means that your body does not have enough vitamin B12. The body needs this important vitamin: To make red blood cells. To make genes (DNA). To help the nerves work. If you do not have enough vitamin B12 in your body, you can have health problems, such as not having enough red blood cells in the blood (anemia). What are the causes? Not eating enough foods that contain vitamin B12. Not being able to take in (absorb) vitamin B12 from the food that you eat. Certain diseases. A condition in which the body does not make enough of a certain protein. This results in your body not taking in enough vitamin B12. Having a surgery in which part of the stomach or small intestine is taken out. Taking medicines that make it hard for the body to take in vitamin B12. These include: Heartburn medicines. Some medicines that are used to treat diabetes. What increases the risk? Being an older adult. Eating a vegetarian or vegan diet that does not include any foods that come from animals. Not eating enough foods that contain vitamin B12 while you are pregnant. Taking certain medicines. Having alcoholism. What are the signs or symptoms? In some cases, there are no symptoms. If the condition leads to too few blood cells or nerve damage, symptoms can occur, such as: Feeling weak or tired. Not being hungry. Losing feeling (numbness) or tingling in your hands and feet. Redness and burning of the tongue. Feeling sad (depressed). Confusion or memory problems. Trouble walking. If anemia is very bad, symptoms can include: Being short of breath. Being dizzy. Having a very fast heartbeat. How is this treated? Changing the way you eat and drink, such as: Eating more foods that contain vitamin B12. Drinking little or no alcohol. Getting vitamin B12 shots. Taking vitamin B12 supplements by mouth (orally). Your doctor will tell you the dose that is best for you. Follow  these instructions at home: Eating and drinking  Eat foods that come from animals and have a lot of vitamin B12 in them. These include: Meats and poultry. This includes beef, pork, chicken, turkey, and organ meats, such as liver. Seafood, such as clams, rainbow trout, salmon, tuna, and haddock. Eggs. Dairy foods such as milk, yogurt, and cheese. Eat breakfast cereals that have vitamin B12 added to them (are fortified). Check the label. The items listed above may not be a complete list of foods and beverages you can eat and drink. Contact a dietitian for more information. Alcohol use Do not drink alcohol if: Your doctor tells you not to drink. You are pregnant, may be pregnant, or are planning to become pregnant. If you drink alcohol: Limit how much you have to: 0-1 drink a day for women. 0-2 drinks a day for men. Know how much alcohol is in your drink. In the [place] one drink equals one 12 oz bottle of beer (355 mL), one 5 oz glass of wine (148 mL), or one 1 oz glass of hard liquor (44 mL). General instructions Get any vitamin B12 shots if told by your doctor. Take supplements only as told by your doctor. Follow the directions. Keep all follow-up visits. Contact a doctor if: Your symptoms come back. Your symptoms get worse or do not get better with treatment. Get help right away if: You have trouble breathing. You have a very fast heartbeat. You have chest pain. You get dizzy. You faint. These symptoms may be an emergency. Get help right away. Call 911.   Do not wait to see if the symptoms will go away. Do not drive yourself to the hospital. Summary Vitamin B12 deficiency means that your body is not getting enough of the vitamin. In some cases, there are no symptoms of this condition. Treatment may include making a change in the way you eat and drink, getting shots, or taking supplements. Eat foods that have vitamin B12 in them. This information is not intended to replace advice  given to you by your health care provider. Make sure you discuss any questions you have with your health care provider. Document Revised: 05/01/2021 Document Reviewed: 05/01/2021 Elsevier Patient Education  2022 Elsevier Inc.  

## 2021-11-03 NOTE — Progress Notes (Signed)
Patient is here for her 2nd b12 injection.

## 2021-11-03 NOTE — Progress Notes (Signed)
HEMATOLOGY/ONCOLOGY CONSULTATION NOTE  Date of Service: 11/03/2021  Patient Care Team: Glendale Chard, MD as PCP - General (Internal Medicine)  CHIEF COMPLAINTS/PURPOSE OF CONSULTATION:  Thrombocytosis  HISTORY OF PRESENTING ILLNESS:   Gabrielle Perkins is a wonderful 46 y.o. female who has been referred to [place] by Dr Glendale Chard, MD for evaluation and management of thrombocytosis.   FHx, Maternal uncle with multiple myeloma, 2 maternal first cousins with AML.   Pt takes Omeprazole for GERD and has done so for 10-12 yrs. She takes Metformin for treatment of her Diabetes. She gets a regular B12 shot with PCP.   She has been experiencing heavier menstruation with larger blood clots and they are irregular. A Mirena IUD was placed in July 2022 and her menstruation lasts shorter periods of time. To supplement her iron deficiency, she has been taking fusion plus iron supplements for the last 2 months. Currently, she is dealing with some fatigue in her day to day life and she finds herself becoming tired while sitting at her desk regularly. She craves ice at times and prefers crushed ice from [employer]. She denies any associated symptoms with her iron supplement.  Her last available labs from 09/30/2021 show CBC with a hemoglobin of 10.3 with a normal WBC count of 6.5k and platelets of 557k CMP showed blood sugar of 192 but otherwise unremarkable Ferritin 32 with an iron saturation of 11%  No history of VTE or arterial thrombosis. No change in vision no headaches or focal neurological deficits. Does have pica symptoms likely related to iron deficiency.  MEDICAL HISTORY:  Past Medical History:  Diagnosis Date   Diabetes mellitus without complication ([place])    GERD (gastroesophageal reflux disease)    Hypertension     SURGICAL HISTORY: Past Surgical History:  Procedure Laterality Date   btl     LEFT HEART CATH AND CORONARY ANGIOGRAPHY N/A 09/12/2017   Procedure: LEFT HEART CATH AND  CORONARY ANGIOGRAPHY;  Surgeon: Dixie Dials, MD;  Location: [facility];  Service: Cardiovascular;  Laterality: N/A;   LIPOMA EXCISION N/A 10/04/2019   Procedure: EXCISION OF FOREHEAD LIPOMA;  Surgeon: Cindra Presume, MD;  Location: [facility];  Service: Plastics;  Laterality: N/A;  45 min, please   TUBAL LIGATION      SOCIAL HISTORY: Social History   Socioeconomic History   Marital status: Married    Spouse name: Not on file   Number of children: Not on file   Years of education: Not on file   Highest education level: Not on file  Occupational History   Not on file  Tobacco Use   Smoking status: Never   Smokeless tobacco: Never  Vaping Use   Vaping Use: Never used  Substance and Sexual Activity   Alcohol use: No   Drug use: Never   Sexual activity: Not on file  Other Topics Concern   Not on file  Social History Narrative   Not on file   Social Determinants of Health   Financial Resource Strain: Not on file  Food Insecurity: Not on file  Transportation Needs: Not on file  Physical Activity: Not on file  Stress: Not on file  Social Connections: Not on file  Intimate Partner Violence: Not on file    FAMILY HISTORY: Family History  Problem Relation Age of Onset   Coronary artery disease Maternal Grandmother    Hypertension Mother    Diabetes Mother    Hypertension Father  Diabetes Father    Sarcoidosis Father    Breast cancer Neg Hx     ALLERGIES:  has No Known Allergies.  MEDICATIONS:  Current Outpatient Medications  Medication Sig Dispense Refill   acetaminophen (TYLENOL) 500 MG tablet Take 500 mg by mouth every 6 (six) hours as needed.     amLODipine (NORVASC) 5 MG tablet Take 1 tablet (5 mg total) by mouth daily. 90 tablet 2   aspirin EC 81 MG tablet Take 1 tablet (81 mg total) by mouth daily. 90 tablet 2   Blood Glucose Monitoring Suppl (FREESTYLE FREEDOM LITE) w/Device KIT Use as directed to check blood sugars 2 times per day  dx: e11.22 1 kit 1   cetirizine (ZYRTEC) 10 MG tablet Take 1 tablet (10 mg total) by mouth daily. 90 tablet 2   fluticasone (FLONASE) 50 MCG/ACT nasal spray Place 2 sprays into both nostrils daily. 16 g 2   glucose blood (FREESTYLE LITE) test strip Use as instructed to check blood sugars 2 times per day dx: e11.22 150 each 2   Iron-FA-B Cmp-C-Biot-Probiotic (FUSION PLUS) CAPS One capsule po qd 90 capsule 3   levonorgestrel (MIRENA, 52 MG,) 20 MCG/DAY IUD Mirena 20 mcg/24 hours (7 yrs) 52 mg intrauterine device  Take 1 device by intrauterine route.     metFORMIN (GLUCOPHAGE-XR) 500 MG 24 hr tablet Take 2 tablets (1,000 mg total) by mouth daily with supper. 90 tablet 2   metoprolol succinate (TOPROL XL) 50 MG 24 hr tablet Take 1 tablet (50 mg total) by mouth daily. 90 tablet 2   Microlet Lancets MISC Use as directed to check blood sugars 2 times per day dx: e11.22 150 each 2   omeprazole (PRILOSEC) 40 MG capsule Take 1 capsule (40 mg total) by mouth daily. 90 capsule 2   telmisartan-hydrochlorothiazide (MICARDIS HCT) 80-25 MG tablet Take 1 tablet by mouth daily. 90 tablet 2   No current facility-administered medications for this visit.    REVIEW OF SYSTEMS:    10 Point review of Systems was done is negative except as noted above.  PHYSICAL EXAMINATION: ECOG PERFORMANCE STATUS: 1 - Symptomatic but completely ambulatory  . Vitals:   11/03/21 1316  BP: (!) 149/92  Pulse: 69  Resp: 19  Temp: 98.2 F (36.8 C)  SpO2: 100%   Filed Weights   11/03/21 1316  Weight: 225 lb 9.6 oz (102.3 kg)   .Body mass index is 39.46 kg/m.  GENERAL:alert, in no acute distress and comfortable SKIN: no acute rashes, no significant lesions EYES: conjunctiva are pink and non-injected, sclera anicteric OROPHARYNX: MMM, no exudates, no oropharyngeal erythema or ulceration NECK: supple, no JVD LYMPH:  no palpable lymphadenopathy in the cervical, axillary or inguinal regions LUNGS: clear to auscultation  b/l with normal respiratory effort HEART: regular rate & rhythm ABDOMEN:  normoactive bowel sounds , non tender, not distended.  No palpable hepatosplenomegaly Extremity: no pedal edema PSYCH: alert & oriented x 3 with fluent speech NEURO: no focal motor/sensory deficits  LABORATORY DATA:  I have reviewed the data as listed  . CBC Latest Ref Rng & Units 11/03/2021 09/30/2021 03/18/2021  WBC 4.0 - 10.5 K/uL 5.8 6.5 5.1  Hemoglobin 12.0 - 15.0 g/dL 10.3(L) 10.3(L) 9.8(L)  Hematocrit 36.0 - 46.0 % 34.2(L) 32.6(L) 32.5(L)  Platelets 150 - 400 K/uL 511(H) 557(H) 398    . CMP Latest Ref Rng & Units 11/03/2021 10/15/2021 09/30/2021  Glucose 70 - 99 mg/dL 94 106(H) 192(H)  BUN 6 - 20  mg/dL '10 10 8  ' Creatinine 0.44 - 1.00 mg/dL 0.65 0.82 0.71  Sodium 135 - 145 mmol/L 135 146(H) 138  Potassium 3.5 - 5.1 mmol/L 3.3(L) 3.8 3.5  Chloride 98 - 111 mmol/L 102 101 99  CO2 22 - 32 mmol/L '27 25 25  ' Calcium 8.9 - 10.3 mg/dL 8.9 9.4 9.5  Total Protein 6.5 - 8.1 g/dL 8.0 7.3 7.3  Total Bilirubin 0.3 - 1.2 mg/dL 0.2(L) 0.3 <0.2  Alkaline Phos 38 - 126 U/L 92 99 105  AST 15 - 41 U/L '16 19 15  ' ALT 0 - 44 U/L '20 20 16     ' RADIOGRAPHIC STUDIES: I have personally reviewed the radiological images as listed and agreed with the findings in the report. No results found.  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  #2 iron deficiency anemia likely from heavy menstrual losses and poor iron absorption due to chronic PPI use. PLAN -I discussed available results with the patient in detail. -We discussed the different etiologies of thrombocytosis. -Given her age and presence of obvious iron deficiency as an etiology for reactive thrombocytosis it is less likely that her thrombocytosis is clonal. -We will check clonal markers to rule out myeloproliferative neoplasm such as essential thrombocytosis although  this is less likely. -We discussed her iron deficiency is likely because of heavy menstrual losses, decreased iron absorption from chronic PPI use. -Given associated vitamin B12 deficiency we will also check parietal cell and intrinsic factor antibodies to rule out pernicious anemia. -Patient on p.o. iron replacement.  Depending on labs might need consideration of IV iron replacement.  Orders Placed This Encounter  Procedures   CBC with Differential/Platelet    Standing Status:   Future    Number of Occurrences:   1    Standing Expiration Date:   11/03/2022   CMP ([facility] only)    Standing Status:   Future    Number of Occurrences:   1    Standing Expiration Date:   11/03/2022   Ferritin    Standing Status:   Future    Number of Occurrences:   1    Standing Expiration Date:   11/03/2022   Anti-parietal antibody    Standing Status:   Future    Number of Occurrences:   1    Standing Expiration Date:   11/03/2022   Intrinsic factor antibodies    Standing Status:   Future    Number of Occurrences:   1    Standing Expiration Date:   11/03/2022   JAK2 (including [id number] and Exon 12), MPL, and CALR-Next Generation Sequencing    Standing Status:   Future    Number of Occurrences:   1    Standing Expiration Date:   11/03/2022     FOLLOW UP: Labs today Phone visit with Dr Irene Limbo in 2 weeks  All of the patients questions were answered with apparent satisfaction. The patient knows to call the clinic with any problems, questions or concerns.  I spent 35 minutes counseling the patient face to face. The total time spent in the appointment was 50 minutes and more than 50% was on counseling and direct patient cares.    Sullivan Lone MD [place] AAHIVMS [facility] Hematology/Oncology Physician [facility]

## 2021-11-04 LAB — IRON AND IRON BINDING CAPACITY (CC-WL,HP ONLY)
Iron: 22 ug/dL — ABNORMAL LOW (ref 28–170)
Saturation Ratios: 4 % — ABNORMAL LOW (ref 10.4–31.8)
TIBC: 525 ug/dL — ABNORMAL HIGH (ref 250–450)
UIBC: 503 ug/dL — ABNORMAL HIGH (ref 148–442)

## 2021-11-06 LAB — INTRINSIC FACTOR ANTIBODIES: Intrinsic Factor: 1 AU/mL (ref 0.0–1.1)

## 2021-11-06 LAB — ANTI-PARIETAL ANTIBODY: Parietal Cell Antibody-IgG: 22.7 Units — ABNORMAL HIGH (ref 0.0–20.0)

## 2021-11-09 LAB — JAK2 (INCLUDING V617F AND EXON 12), MPL,& CALR-NEXT GEN SEQ

## 2021-11-10 ENCOUNTER — Ambulatory Visit (INDEPENDENT_AMBULATORY_CARE_PROVIDER_SITE_OTHER)

## 2021-11-10 ENCOUNTER — Other Ambulatory Visit: Payer: Self-pay

## 2021-11-10 VITALS — BP 114/68 | HR 88 | Temp 98.4°F | Ht 63.4 in

## 2021-11-10 DIAGNOSIS — E538 Deficiency of other specified B group vitamins: Secondary | ICD-10-CM

## 2021-11-10 MED ORDER — CYANOCOBALAMIN 1000 MCG/ML IJ SOLN
1000.0000 ug | Freq: Once | INTRAMUSCULAR | Status: AC
  Administered 2021-11-10: 1000 ug via INTRAMUSCULAR

## 2021-11-10 NOTE — Progress Notes (Signed)
The patient is here for her 3rd weekly vitamin b12 injection.

## 2021-11-17 ENCOUNTER — Ambulatory Visit (INDEPENDENT_AMBULATORY_CARE_PROVIDER_SITE_OTHER)

## 2021-11-17 ENCOUNTER — Other Ambulatory Visit: Payer: Self-pay

## 2021-11-17 VITALS — BP 130/70 | HR 75 | Temp 98.4°F | Ht 63.4 in | Wt 226.0 lb

## 2021-11-17 DIAGNOSIS — E538 Deficiency of other specified B group vitamins: Secondary | ICD-10-CM | POA: Diagnosis not present

## 2021-11-17 MED ORDER — CYANOCOBALAMIN 1000 MCG/ML IJ SOLN
1000.0000 ug | Freq: Once | INTRAMUSCULAR | Status: AC
  Administered 2021-11-17: 1000 ug via INTRAMUSCULAR

## 2021-11-17 NOTE — Patient Instructions (Signed)
Vitamin B12 Deficiency Vitamin B12 deficiency means that your body does not have enough vitamin B12. The body needs this important vitamin: To make red blood cells. To make genes (DNA). To help the nerves work. If you do not have enough vitamin B12 in your body, you can have health problems, such as not having enough red blood cells in the blood (anemia). What are the causes? Not eating enough foods that contain vitamin B12. Not being able to take in (absorb) vitamin B12 from the food that you eat. Certain diseases. A condition in which the body does not make enough of a certain protein. This results in your body not taking in enough vitamin B12. Having a surgery in which part of the stomach or small intestine is taken out. Taking medicines that make it hard for the body to take in vitamin B12. These include: Heartburn medicines. Some medicines that are used to treat diabetes. What increases the risk? Being an older adult. Eating a vegetarian or vegan diet that does not include any foods that come from animals. Not eating enough foods that contain vitamin B12 while you are pregnant. Taking certain medicines. Having alcoholism. What are the signs or symptoms? In some cases, there are no symptoms. If the condition leads to too few blood cells or nerve damage, symptoms can occur, such as: Feeling weak or tired. Not being hungry. Losing feeling (numbness) or tingling in your hands and feet. Redness and burning of the tongue. Feeling sad (depressed). Confusion or memory problems. Trouble walking. If anemia is very bad, symptoms can include: Being short of breath. Being dizzy. Having a very fast heartbeat. How is this treated? Changing the way you eat and drink, such as: Eating more foods that contain vitamin B12. Drinking little or no alcohol. Getting vitamin B12 shots. Taking vitamin B12 supplements by mouth (orally). Your doctor will tell you the dose that is best for you. Follow  these instructions at home: Eating and drinking  Eat foods that come from animals and have a lot of vitamin B12 in them. These include: Meats and poultry. This includes beef, pork, chicken, turkey, and organ meats, such as liver. Seafood, such as clams, rainbow trout, salmon, tuna, and haddock. Eggs. Dairy foods such as milk, yogurt, and cheese. Eat breakfast cereals that have vitamin B12 added to them (are fortified). Check the label. The items listed above may not be a complete list of foods and beverages you can eat and drink. Contact a dietitian for more information. Alcohol use Do not drink alcohol if: Your doctor tells you not to drink. You are pregnant, may be pregnant, or are planning to become pregnant. If you drink alcohol: Limit how much you have to: 0-1 drink a day for women. 0-2 drinks a day for men. Know how much alcohol is in your drink. In the [place] one drink equals one 12 oz bottle of beer (355 mL), one 5 oz glass of wine (148 mL), or one 1 oz glass of hard liquor (44 mL). General instructions Get any vitamin B12 shots if told by your doctor. Take supplements only as told by your doctor. Follow the directions. Keep all follow-up visits. Contact a doctor if: Your symptoms come back. Your symptoms get worse or do not get better with treatment. Get help right away if: You have trouble breathing. You have a very fast heartbeat. You have chest pain. You get dizzy. You faint. These symptoms may be an emergency. Get help right away. Call 911.   Do not wait to see if the symptoms will go away. Do not drive yourself to the hospital. Summary Vitamin B12 deficiency means that your body is not getting enough of the vitamin. In some cases, there are no symptoms of this condition. Treatment may include making a change in the way you eat and drink, getting shots, or taking supplements. Eat foods that have vitamin B12 in them. This information is not intended to replace advice  given to you by your health care provider. Make sure you discuss any questions you have with your health care provider. Document Revised: 05/01/2021 Document Reviewed: 05/01/2021 Elsevier Patient Education  2022 Elsevier Inc.  

## 2021-11-17 NOTE — Progress Notes (Signed)
Patient is here for her 4th b12 injection.

## 2021-11-23 ENCOUNTER — Inpatient Hospital Stay: Attending: Hematology | Admitting: Hematology

## 2021-11-23 DIAGNOSIS — Z79899 Other long term (current) drug therapy: Secondary | ICD-10-CM | POA: Diagnosis not present

## 2021-11-23 DIAGNOSIS — R5383 Other fatigue: Secondary | ICD-10-CM | POA: Diagnosis not present

## 2021-11-23 DIAGNOSIS — K219 Gastro-esophageal reflux disease without esophagitis: Secondary | ICD-10-CM | POA: Diagnosis not present

## 2021-11-23 DIAGNOSIS — Z833 Family history of diabetes mellitus: Secondary | ICD-10-CM | POA: Insufficient documentation

## 2021-11-23 DIAGNOSIS — Z8249 Family history of ischemic heart disease and other diseases of the circulatory system: Secondary | ICD-10-CM | POA: Diagnosis not present

## 2021-11-23 DIAGNOSIS — I10 Essential (primary) hypertension: Secondary | ICD-10-CM | POA: Insufficient documentation

## 2021-11-23 DIAGNOSIS — D75838 Other thrombocytosis: Secondary | ICD-10-CM | POA: Insufficient documentation

## 2021-11-23 DIAGNOSIS — D509 Iron deficiency anemia, unspecified: Secondary | ICD-10-CM | POA: Diagnosis not present

## 2021-11-23 DIAGNOSIS — E119 Type 2 diabetes mellitus without complications: Secondary | ICD-10-CM | POA: Diagnosis not present

## 2021-11-23 DIAGNOSIS — D75839 Thrombocytosis, unspecified: Secondary | ICD-10-CM

## 2021-11-23 DIAGNOSIS — D500 Iron deficiency anemia secondary to blood loss (chronic): Secondary | ICD-10-CM

## 2021-11-24 ENCOUNTER — Encounter: Payer: Self-pay | Admitting: Internal Medicine

## 2021-11-24 ENCOUNTER — Ambulatory Visit: Admitting: Internal Medicine

## 2021-11-24 ENCOUNTER — Other Ambulatory Visit: Payer: Self-pay

## 2021-11-24 ENCOUNTER — Telehealth: Payer: Self-pay | Admitting: Hematology

## 2021-11-24 VITALS — BP 118/80 | HR 88 | Temp 98.5°F | Ht 64.2 in | Wt 231.4 lb

## 2021-11-24 DIAGNOSIS — E538 Deficiency of other specified B group vitamins: Secondary | ICD-10-CM

## 2021-11-24 DIAGNOSIS — E6609 Other obesity due to excess calories: Secondary | ICD-10-CM

## 2021-11-24 DIAGNOSIS — D500 Iron deficiency anemia secondary to blood loss (chronic): Secondary | ICD-10-CM

## 2021-11-24 DIAGNOSIS — Z6839 Body mass index (BMI) 39.0-39.9, adult: Secondary | ICD-10-CM

## 2021-11-24 DIAGNOSIS — R0683 Snoring: Secondary | ICD-10-CM

## 2021-11-24 MED ORDER — CYANOCOBALAMIN 1000 MCG/ML IJ SOLN
1000.0000 ug | Freq: Once | INTRAMUSCULAR | Status: AC
  Administered 2021-11-24: 1000 ug via INTRAMUSCULAR

## 2021-11-24 NOTE — Telephone Encounter (Signed)
Scheduled follow-up appointments per 3/6 los. Patient is aware. ?

## 2021-11-24 NOTE — Progress Notes (Signed)
?[name],acting as a Education administrator for [name], MD.,have documented all relevant documentation on the behalf of [name], MD,as directed by  [name], MD while in the presence of [name], MD.  ?This visit occurred during the SARS-CoV-2 public health emergency.  Safety protocols were in place, including screening questions prior to the visit, additional usage of staff PPE, and extensive cleaning of exam room while observing appropriate contact time as indicated for disinfecting solutions. ? ?Subjective:  ?  ? Patient ID: Gabrielle Perkins , female    DOB: [birth year] , 46 y.o.   MRN: [id number] ? ? ?Chief Complaint  ?Patient presents with  ? B12 Injection  ? ? ?HPI ? ?Patient presents today for a B12 injection. Patient does not have any questions or concerns today. Unfortunately, her energy level has not improved since receiving the injections. She also has iron deficiency anemia. She is now followed by Hematology and is scheduled for iron infusion next week.  ?  ? ?Past Medical History:  ?Diagnosis Date  ? Diabetes mellitus without complication ([place])   ? GERD (gastroesophageal reflux disease)   ? Hypertension   ?  ? ?Family History  ?Problem Relation Age of Onset  ? Coronary artery disease Maternal Grandmother   ? Hypertension Mother   ? Diabetes Mother   ? Hypertension Father   ? Diabetes Father   ? Sarcoidosis Father   ? Breast cancer Neg Hx   ? ? ? ?Current Outpatient Medications:  ?  acetaminophen (TYLENOL) 500 MG tablet, Take 500 mg by mouth every 6 (six) hours as needed., Disp: , Rfl:  ?  amLODipine (NORVASC) 5 MG tablet, Take 1 tablet (5 mg total) by mouth daily., Disp: 90 tablet, Rfl: 2 ?  aspirin EC 81 MG tablet, Take 1 tablet (81 mg total) by mouth daily., Disp: 90 tablet, Rfl: 2 ?  Blood Glucose Monitoring Suppl (FREESTYLE FREEDOM LITE) w/Device KIT, Use as directed to check blood sugars 2 times per day dx: e11.22, Disp: 1 kit, Rfl: 1 ?  cetirizine (ZYRTEC) 10 MG tablet,  Take 1 tablet (10 mg total) by mouth daily., Disp: 90 tablet, Rfl: 2 ?  fluticasone (FLONASE) 50 MCG/ACT nasal spray, Place 2 sprays into both nostrils daily., Disp: 16 g, Rfl: 2 ?  glucose blood (FREESTYLE LITE) test strip, Use as instructed to check blood sugars 2 times per day dx: e11.22, Disp: 150 each, Rfl: 2 ?  levonorgestrel (MIRENA, 52 MG,) 20 MCG/DAY IUD, Mirena 20 mcg/24 hours (7 yrs) 52 mg intrauterine device  Take 1 device by intrauterine route., Disp: , Rfl:  ?  metFORMIN (GLUCOPHAGE-XR) 500 MG 24 hr tablet, Take 2 tablets (1,000 mg total) by mouth daily with supper., Disp: 90 tablet, Rfl: 2 ?  metoprolol succinate (TOPROL XL) 50 MG 24 hr tablet, Take 1 tablet (50 mg total) by mouth daily., Disp: 90 tablet, Rfl: 2 ?  Microlet Lancets MISC, Use as directed to check blood sugars 2 times per day dx: e11.22, Disp: 150 each, Rfl: 2 ?  omeprazole (PRILOSEC) 40 MG capsule, Take 1 capsule (40 mg total) by mouth daily., Disp: 90 capsule, Rfl: 2 ?  telmisartan-hydrochlorothiazide (MICARDIS HCT) 80-25 MG tablet, Take 1 tablet by mouth daily., Disp: 90 tablet, Rfl: 2 ?  Iron-FA-B Cmp-C-Biot-Probiotic (FUSION PLUS) CAPS, One capsule po qd (Patient not taking: Reported on 11/24/2021), Disp: 90 capsule, Rfl: 3  ? ?No Known Allergies  ? ?Review of Systems  ?Constitutional:  Positive for fatigue.  ?     She states her husband states she stopped breathing when she was sleep. He saw this on one occasion. No previous h/o sleep apnea. She has not had a sleep study in the past.   ?Respiratory: Negative.    ?Cardiovascular: Negative.   ?Gastrointestinal: Negative.   ?Neurological: Negative.   ?Psychiatric/Behavioral: Negative.     ? ?Today's Vitals  ? 11/24/21 1552  ?BP: 118/80  ?Pulse: 88  ?Temp: 98.5 ?F (36.9 ?C)  ?Weight: 231 lb 6.4 oz (105 kg)  ?Height: 5' 4.2" (1.631 m)  ?PainSc: 0-No pain  ? ?Body mass index is 39.47 kg/m?.  ?Wt Readings from Last 3 Encounters:  ?11/24/21 231 lb 6.4 oz (105 kg)  ?11/17/21 226 lb (102.5  kg)  ?11/03/21 227 lb 12.8 oz (103.3 kg)  ?  ? ?Objective:  ?Physical Exam ?Vitals and nursing note reviewed.  ?Constitutional:   ?   Appearance: Normal appearance. She is obese.  ?HENT:  ?   Head: Normocephalic and atraumatic.  ?   Nose:  ?   Comments: Masked  ?   Mouth/Throat:  ?   Comments: Masked  ?Eyes:  ?   Extraocular Movements: Extraocular movements intact.  ?Cardiovascular:  ?   Rate and Rhythm: Normal rate and regular rhythm.  ?   Heart sounds: Normal heart sounds.  ?Pulmonary:  ?   Effort: Pulmonary effort is normal.  ?   Breath sounds: Normal breath sounds.  ?Musculoskeletal:  ?   Cervical back: Normal range of motion.  ?Skin: ?   General: Skin is warm.  ?Neurological:  ?   General: No focal deficit present.  ?   Mental Status: She is alert.  ?Psychiatric:     ?   Mood and Affect: Mood normal.     ?   Behavior: Behavior normal.  ?  ? ?   ?Assessment And Plan:  ?   ?1. B12 deficiency ?Comments: I will check vitamin B12 level today. She wil also be given vitamin B12 injection.  ?- cyanocobalamin ((VITAMIN B-12)) injection 1,000 mcg ?- Vitamin B12 ? ?2. Iron deficiency anemia due to chronic blood loss ?Comments: Hematology input appreciated. Pt advised she may feel more energy shortly after her upcoming iron infusion.  ? ?3. Snoring ?Comments: She also reports nocturia, frequent headaches and non-restorative sleep. She agrees to Neuro eval for further evaluation of possible OSA. ?- Ambulatory referral to Neurology ? ?4. Class 2 obesity due to excess calories without serious comorbidity with body mass index (BMI) of 39.0 to 39.9 in adult ?She is encouraged to initially strive for BMI less than 30 to decrease cardiac risk. She is advised to exercise no less than 150 minutes per week.  ? ?Patient was given opportunity to ask questions. Patient verbalized understanding of the plan and was able to repeat key elements of the plan. All questions were answered to their satisfaction.  ? ?I, [name], MD, have  reviewed all documentation for this visit. The documentation on 11/24/21 for the exam, diagnosis, procedures, and orders are all accurate and complete.  ? ?IF YOU HAVE BEEN REFERRED TO A SPECIALIST, IT MAY TAKE 1-2 WEEKS TO SCHEDULE/PROCESS THE REFERRAL. IF YOU HAVE NOT HEARD FROM US/SPECIALIST IN TWO WEEKS, PLEASE GIVE [place] A CALL AT [phone].  ? ?THE PATIENT IS ENCOURAGED TO PRACTICE SOCIAL DISTANCING DUE TO THE COVID-19 PANDEMIC.   ?

## 2021-11-24 NOTE — Patient Instructions (Signed)
Vitamin B12 Deficiency Vitamin B12 deficiency means that your body does not have enough vitamin B12. The body needs this important vitamin: To make red blood cells. To make genes (DNA). To help the nerves work. If you do not have enough vitamin B12 in your body, you can have health problems, such as not having enough red blood cells in the blood (anemia). What are the causes? Not eating enough foods that contain vitamin B12. Not being able to take in (absorb) vitamin B12 from the food that you eat. Certain diseases. A condition in which the body does not make enough of a certain protein. This results in your body not taking in enough vitamin B12. Having a surgery in which part of the stomach or small intestine is taken out. Taking medicines that make it hard for the body to take in vitamin B12. These include: Heartburn medicines. Some medicines that are used to treat diabetes. What increases the risk? Being an older adult. Eating a vegetarian or vegan diet that does not include any foods that come from animals. Not eating enough foods that contain vitamin B12 while you are pregnant. Taking certain medicines. Having alcoholism. What are the signs or symptoms? In some cases, there are no symptoms. If the condition leads to too few blood cells or nerve damage, symptoms can occur, such as: Feeling weak or tired. Not being hungry. Losing feeling (numbness) or tingling in your hands and feet. Redness and burning of the tongue. Feeling sad (depressed). Confusion or memory problems. Trouble walking. If anemia is very bad, symptoms can include: Being short of breath. Being dizzy. Having a very fast heartbeat. How is this treated? Changing the way you eat and drink, such as: Eating more foods that contain vitamin B12. Drinking little or no alcohol. Getting vitamin B12 shots. Taking vitamin B12 supplements by mouth (orally). Your doctor will tell you the dose that is best for you. Follow  these instructions at home: Eating and drinking  Eat foods that come from animals and have a lot of vitamin B12 in them. These include: Meats and poultry. This includes beef, pork, chicken, turkey, and organ meats, such as liver. Seafood, such as clams, rainbow trout, salmon, tuna, and haddock. Eggs. Dairy foods such as milk, yogurt, and cheese. Eat breakfast cereals that have vitamin B12 added to them (are fortified). Check the label. The items listed above may not be a complete list of foods and beverages you can eat and drink. Contact a dietitian for more information. Alcohol use Do not drink alcohol if: Your doctor tells you not to drink. You are pregnant, may be pregnant, or are planning to become pregnant. If you drink alcohol: Limit how much you have to: 0-1 drink a day for women. 0-2 drinks a day for men. Know how much alcohol is in your drink. In the [place] one drink equals one 12 oz bottle of beer (355 mL), one 5 oz glass of wine (148 mL), or one 1 oz glass of hard liquor (44 mL). General instructions Get any vitamin B12 shots if told by your doctor. Take supplements only as told by your doctor. Follow the directions. Keep all follow-up visits. Contact a doctor if: Your symptoms come back. Your symptoms get worse or do not get better with treatment. Get help right away if: You have trouble breathing. You have a very fast heartbeat. You have chest pain. You get dizzy. You faint. These symptoms may be an emergency. Get help right away. Call 911.   Do not wait to see if the symptoms will go away. Do not drive yourself to the hospital. Summary Vitamin B12 deficiency means that your body is not getting enough of the vitamin. In some cases, there are no symptoms of this condition. Treatment may include making a change in the way you eat and drink, getting shots, or taking supplements. Eat foods that have vitamin B12 in them. This information is not intended to replace advice  given to you by your health care provider. Make sure you discuss any questions you have with your health care provider. Document Revised: 05/01/2021 Document Reviewed: 05/01/2021 Elsevier Patient Education  2022 Elsevier Inc.  

## 2021-11-25 LAB — VITAMIN B12: Vitamin B-12: 733 pg/mL (ref 232–1245)

## 2021-11-30 ENCOUNTER — Encounter: Payer: Self-pay | Admitting: Hematology

## 2021-11-30 NOTE — Progress Notes (Addendum)
HEMATOLOGY/ONCOLOGY PHONE VISIT NOTE  Date of Service: 11/23/2021   Patient Care Team: Glendale Chard, MD as PCP - General (Internal Medicine)  CHIEF COMPLAINTS/PURPOSE OF CONSULTATION:  Discussion of lab work-up for thrombocytosis  HISTORY OF PRESENTING ILLNESS:   Gabrielle Perkins is a wonderful 46 y.o. female who has been referred to [place] by Dr Glendale Chard, MD for evaluation and management of thrombocytosis.   FHx, Maternal uncle with multiple myeloma, 2 maternal first cousins with AML.   Pt takes Omeprazole for GERD and has done so for 10-12 yrs. She takes Metformin for treatment of her Diabetes. She gets a regular B12 shot with PCP.   She has been experiencing heavier menstruation with larger blood clots and they are irregular. A Mirena IUD was placed in July 2022 and her menstruation lasts shorter periods of time. To supplement her iron deficiency, she has been taking fusion plus iron supplements for the last 2 months. Currently, she is dealing with some fatigue in her day to day life and she finds herself becoming tired while sitting at her desk regularly. She craves ice at times and prefers crushed ice from [employer]. She denies any associated symptoms with her iron supplement.  Her last available labs from 09/30/2021 show CBC with a hemoglobin of 10.3 with a normal WBC count of 6.5k and platelets of 557k CMP showed blood sugar of 192 but otherwise unremarkable Ferritin 32 with an iron saturation of 11%  No history of VTE or arterial thrombosis. No change in vision no headaches or focal neurological deficits. Does have pica symptoms likely related to iron deficiency.  Interval history  .I connected with Gabrielle Perkins on 11/23/2021 at 10:20 AM EST by telephone visit and verified that I am speaking with the correct person using two identifiers.   I discussed the limitations, risks, security and privacy concerns of performing an evaluation and management service by  telemedicine and the availability of in-person appointments. I also discussed with the patient that there may be a patient responsible charge related to this service. The patient expressed understanding and agreed to proceed.   Other persons participating in the visit and their role in the encounter: None  Patients location: Home Providers location: [place]  Chief Complaint: Lab result discussion  Patient notes no new symptoms since her last clinic visit. Her labs were discussed in detail.  No evidence of clonal thrombocytosis.   MEDICAL HISTORY:  Past Medical History:  Diagnosis Date   Diabetes mellitus without complication ([place])    GERD (gastroesophageal reflux disease)    Hypertension     SURGICAL HISTORY: Past Surgical History:  Procedure Laterality Date   btl     LEFT HEART CATH AND CORONARY ANGIOGRAPHY N/A 09/12/2017   Procedure: LEFT HEART CATH AND CORONARY ANGIOGRAPHY;  Surgeon: Dixie Dials, MD;  Location: [facility];  Service: Cardiovascular;  Laterality: N/A;   LIPOMA EXCISION N/A 10/04/2019   Procedure: EXCISION OF FOREHEAD LIPOMA;  Surgeon: Cindra Presume, MD;  Location: [facility];  Service: Plastics;  Laterality: N/A;  45 min, please   TUBAL LIGATION      SOCIAL HISTORY: Social History   Socioeconomic History   Marital status: Married    Spouse name: Not on file   Number of children: Not on file   Years of education: Not on file   Highest education level: Not on file  Occupational History   Not on file  Tobacco  Smoking status: Never  ° Smokeless tobacco: Never  °Vaping Use  ° Vaping Use: Never used  °Substance and Sexual Activity  ° Alcohol use: No  ° Drug use: Never  ° Sexual activity: Not on file  °Other Topics Concern  ° Not on file  °Social History Narrative  ° Not on file  ° °Social Determinants of Health  ° °Financial Resource Strain: Not on file  °Food Insecurity: Not on file  °Transportation Needs: Not  on file  °Physical Activity: Not on file  °Stress: Not on file  °Social Connections: Not on file  °Intimate Partner Violence: Not on file  ° ° °FAMILY HISTORY: °Family History  °Problem Relation Age of Onset  ° Coronary artery disease Maternal Grandmother   ° Hypertension Mother   ° Diabetes Mother   ° Hypertension Father   ° Diabetes Father   ° Sarcoidosis Father   ° Breast cancer Neg Hx   ° ° °ALLERGIES:  has No Known Allergies. ° °MEDICATIONS:  °Current Outpatient Medications  °Medication Sig Dispense Refill  ° acetaminophen (TYLENOL) 500 MG tablet Take 500 mg by mouth every 6 (six) hours as needed.    ° amLODipine (NORVASC) 5 MG tablet Take 1 tablet (5 mg total) by mouth daily. 90 tablet 2  ° aspirin EC 81 MG tablet Take 1 tablet (81 mg total) by mouth daily. 90 tablet 2  ° Blood Glucose Monitoring Suppl (FREESTYLE FREEDOM LITE) w/Device KIT Use as directed to check blood sugars 2 times per day dx: e11.22 1 kit 1  ° cetirizine (ZYRTEC) 10 MG tablet Take 1 tablet (10 mg total) by mouth daily. 90 tablet 2  ° fluticasone (FLONASE) 50 MCG/ACT nasal spray Place 2 sprays into both nostrils daily. 16 g 2  ° glucose blood (FREESTYLE LITE) test strip Use as instructed to check blood sugars 2 times per day dx: e11.22 150 each 2  ° Iron-FA-B Cmp-C-Biot-Probiotic (FUSION PLUS) CAPS One capsule po qd (Patient not taking: Reported on 11/24/2021) 90 capsule 3  ° levonorgestrel (MIRENA, 52 MG,) 20 MCG/DAY IUD Mirena 20 mcg/24 hours (7 yrs) 52 mg intrauterine device ° Take 1 device by intrauterine route.    ° metFORMIN (GLUCOPHAGE-XR) 500 MG 24 hr tablet Take 2 tablets (1,000 mg total) by mouth daily with supper. 90 tablet 2  ° metoprolol succinate (TOPROL XL) 50 MG 24 hr tablet Take 1 tablet (50 mg total) by mouth daily. 90 tablet 2  ° Microlet Lancets MISC Use as directed to check blood sugars 2 times per day dx: e11.22 150 each 2  ° omeprazole (PRILOSEC) 40 MG capsule Take 1 capsule (40 mg total) by mouth daily. 90 capsule 2  °  telmisartan-hydrochlorothiazide (MICARDIS HCT) 80-25 MG tablet Take 1 tablet by mouth daily. 90 tablet 2  ° °No current facility-administered medications for this visit.  ° ° °REVIEW OF SYSTEMS:   ° °Notes fatigue related to iron deficiency anemia ° °PHYSICAL EXAMINATION: Telemedicine visit ° °LABORATORY DATA:  °I have reviewed the data as listed ° °. °CBC Latest Ref Rng & Units 11/03/2021 09/30/2021 03/18/2021  °WBC 4.0 - 10.5 K/uL 5.8 6.5 5.1  °Hemoglobin 12.0 - 15.0 g/dL 10.3(L) 10.3(L) 9.8(L)  °Hematocrit 36.0 - 46.0 % 34.2(L) 32.6(L) 32.5(L)  °Platelets 150 - 400 K/uL 511(H) 557(H) 398  ° ° °. °CMP Latest Ref Rng & Units 11/03/2021 10/15/2021 09/30/2021  °Glucose 70 - 99 mg/dL 94 106(H) 192(H)  °BUN 6 - 20 mg/dL 10 10 8  °Creatinine   8  Creatinine 0.44 - 1.00 mg/dL 0.65 0.82 0.71  Sodium 135 - 145 mmol/L 135 146(H) 138  Potassium 3.5 - 5.1 mmol/L 3.3(L) 3.8 3.5  Chloride 98 - 111 mmol/L 102 101 99  CO2 22 - 32 mmol/L _0 Calcium 8.9 - 10.3 mg/dL 8.9 9.4 9.5  Total Protein 6.5 - 8.1 g/dL 8.0 7.3 7.3  Total Bilirubin 0.3 - 1.2 mg/dL 0.2(L) 0.3 <0.2  Alkaline Phos 38 - 126 U/L 92 99 105  AST 15 - 41 U/L _1 ALT 0 - 44 U/L _2 . Lab Results  Component Value Date   IRON 22 (L) 11/03/2021   TIBC 525 (H) 11/03/2021   IRONPCTSAT 4 (L) 11/03/2021   (Iron and TIBC)  Lab Results  Component Value Date   FERRITIN 7 (L) 11/03/2021    Component     Latest Ref Rng & Units 11/03/2021 11/24/2021  Intrinsic Factor     0.0 - 1.1 AU/mL 1.0   Parietal Cell Antibody-IgG     0.0 - 20.0 Units 22.7 (H)   Vitamin B12     232 - 1,245 pg/mL  733      RADIOGRAPHIC STUDIES: I have personally reviewed the radiological images as listed and agreed with the findings in the report. No results found.  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 iron absorption.  PLAN -Labs are discussed in detail with the patient. CBC shows thrombocytosis with with platelets of 511k and anemia with severe iron deficiency. 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 Patient would like to proceed with IV iron and we shall schedule her for IV Venofer 300 mg weekly x3 doses with Tylenol and loratadine as premedications. -Continue p.o. B12 and B complex replacement.  FOLLOW UP: IV Venofer 300 mg weekly x3 doses starting in about 1 week Return to clinic with Dr. Irene Limbo with labs in 3 months  All of the patients questions were answered with apparent satisfaction. The patient knows to call the clinic with any problems, questions or concerns.  The total time spent in the appointment was 12 minutes*.  All of the patient's questions were answered with apparent satisfaction. The patient knows to call the clinic with any problems, questions or concerns.   Sullivan Lone MD MS AAHIVMS [facility] Hematology/Oncology Physician [facility]  .*Total Encounter Time as defined by the Centers for Medicare and Medicaid Services includes, in addition to the face-to-face time of a patient visit (documented in the note above) non-face-to-face time: obtaining and reviewing outside history, ordering and reviewing medications, tests or procedures, care coordination (communications with other health care professionals or caregivers) and documentation in the  medical record.

## 2021-12-01 ENCOUNTER — Other Ambulatory Visit: Payer: Self-pay

## 2021-12-01 ENCOUNTER — Inpatient Hospital Stay

## 2021-12-01 VITALS — BP 114/80 | HR 74 | Temp 98.3°F | Resp 18

## 2021-12-01 DIAGNOSIS — D75838 Other thrombocytosis: Secondary | ICD-10-CM | POA: Diagnosis not present

## 2021-12-01 DIAGNOSIS — D500 Iron deficiency anemia secondary to blood loss (chronic): Secondary | ICD-10-CM

## 2021-12-01 MED ORDER — SODIUM CHLORIDE 0.9 % IV SOLN: Freq: Once | INTRAVENOUS | Status: AC

## 2021-12-01 MED ORDER — LORATADINE 10 MG PO TABS
10.0000 mg | ORAL_TABLET | Freq: Once | ORAL | Status: AC
  Administered 2021-12-01: 10 mg via ORAL
  Filled 2021-12-01: qty 1

## 2021-12-01 MED ORDER — SODIUM CHLORIDE 0.9 % IV SOLN
300.0000 mg | Freq: Once | INTRAVENOUS | Status: AC
  Administered 2021-12-01: 300 mg via INTRAVENOUS
  Filled 2021-12-01: qty 300

## 2021-12-01 MED ORDER — ACETAMINOPHEN 325 MG PO TABS
650.0000 mg | ORAL_TABLET | Freq: Once | ORAL | Status: AC
  Administered 2021-12-01: 650 mg via ORAL
  Filled 2021-12-01: qty 2

## 2021-12-01 NOTE — Patient Instructions (Signed)

## 2021-12-01 NOTE — Progress Notes (Signed)
Patient observed for 30 minutes following administration of venofer infusion. Vital signs retaken and remained stable. Patient showed no signs of distress at time of discharge. ?

## 2021-12-08 ENCOUNTER — Other Ambulatory Visit: Payer: Self-pay

## 2021-12-08 ENCOUNTER — Ambulatory Visit (INDEPENDENT_AMBULATORY_CARE_PROVIDER_SITE_OTHER)

## 2021-12-08 ENCOUNTER — Encounter (HOSPITAL_BASED_OUTPATIENT_CLINIC_OR_DEPARTMENT_OTHER): Payer: Self-pay | Admitting: Cardiovascular Disease

## 2021-12-08 ENCOUNTER — Ambulatory Visit (INDEPENDENT_AMBULATORY_CARE_PROVIDER_SITE_OTHER): Admitting: Cardiovascular Disease

## 2021-12-08 VITALS — BP 128/86 | HR 80 | Ht 64.0 in | Wt 229.4 lb

## 2021-12-08 DIAGNOSIS — R002 Palpitations: Secondary | ICD-10-CM

## 2021-12-08 NOTE — Progress Notes (Unsigned)
Enrolled patient for a 7 day Zio XT monitor to be mailed to patients home.  

## 2021-12-08 NOTE — Progress Notes (Signed)
? ? ?Cardiology Office Note ? ? ?Date:  12/08/2021  ? ?ID:  Gabrielle Perkins, DOB September 21, 1975, MRN [id number] ? ?PCP:  Glendale Chard, MD  ?Cardiologist:   Skeet Latch, MD  ? ?No chief complaint on file. ? ? ?  ?History of Present Illness: ?Gabrielle Perkins is a 46 y.o. female with diabetes, hypertension, iron deficiency anemia, and GERD who is being seen today for the evaluation of palpitations at the request of Glendale Chard, MD. she saw Dr. Baird Cancer 09/2020 and reported palpitations.  She sometimes feels like her heart is skipping a beat and then it makes her feel anxious.  She has noted it at work or when sitting at home.  The episodes occur once or twice per week.  Last for a few seconds at a time.  Her husband has checked her heart and he notes that sometimes it seems like it is beating too fast.  Sometimes it makes her feel like she will pass out.  It seems to occur more when she first lays down at night.  She has minimal caffeine intake.  Dr. Baird Cancer recently referred her for sleep study due to fatigue and snoring.   ? ?Lately she has not been getting much exercise.  She wants to start walking more.  She notes that she sometimes gets short of breath going up and down stairs but has no exertional chest pain or pressure.  She has not had any lower extremity edema, orthopnea, or PND.  She sometimes gets short of breath going up and down the steps.  In general her blood pressure has been well controlled.  She limits her sodium intake.  She previously had a left heart cath in 2018 that revealed normal coronary arteries.   ? ? ?Past Medical History:  ?Diagnosis Date  ? Diabetes mellitus without complication ([place])   ? GERD (gastroesophageal reflux disease)   ? Hypertension   ? ? ?Past Surgical History:  ?Procedure Laterality Date  ? btl    ? LEFT HEART CATH AND CORONARY ANGIOGRAPHY N/A 09/12/2017  ? Procedure: LEFT HEART CATH AND CORONARY ANGIOGRAPHY;  Surgeon: Dixie Dials, MD;  Location: [facility];   Service: Cardiovascular;  Laterality: N/A;  ? LIPOMA EXCISION N/A 10/04/2019  ? Procedure: EXCISION OF FOREHEAD LIPOMA;  Surgeon: Cindra Presume, MD;  Location: [facility];  Service: Plastics;  Laterality: N/A;  45 min, please  ? TUBAL LIGATION    ? ? ? ?Current Outpatient Medications  ?Medication Sig Dispense Refill  ? acetaminophen (TYLENOL) 500 MG tablet Take 500 mg by mouth every 6 (six) hours as needed.    ? amLODipine (NORVASC) 5 MG tablet Take 1 tablet (5 mg total) by mouth daily. 90 tablet 2  ? Blood Glucose Monitoring Suppl (FREESTYLE FREEDOM LITE) w/Device KIT Use as directed to check blood sugars 2 times per day dx: e11.22 1 kit 1  ? cetirizine (ZYRTEC) 10 MG tablet Take 1 tablet (10 mg total) by mouth daily. 90 tablet 2  ? fluticasone (FLONASE) 50 MCG/ACT nasal spray Place 2 sprays into both nostrils daily. 16 g 2  ? glucose blood (FREESTYLE LITE) test strip Use as instructed to check blood sugars 2 times per day dx: e11.22 150 each 2  ? Iron-FA-B Cmp-C-Biot-Probiotic (FUSION PLUS) CAPS One capsule po qd 90 capsule 3  ? levonorgestrel (MIRENA, 52 MG,) 20 MCG/DAY IUD Mirena 20 mcg/24 hours (7 yrs) 52 mg intrauterine device ? Take 1 device by intrauterine route.    ?  metFORMIN (GLUCOPHAGE-XR) 500 MG 24 hr tablet Take 2 tablets (1,000 mg total) by mouth daily with supper. 90 tablet 2  ? metoprolol succinate (TOPROL XL) 50 MG 24 hr tablet Take 1 tablet (50 mg total) by mouth daily. 90 tablet 2  ? Microlet Lancets MISC Use as directed to check blood sugars 2 times per day dx: e11.22 150 each 2  ? omeprazole (PRILOSEC) 40 MG capsule Take 1 capsule (40 mg total) by mouth daily. 90 capsule 2  ? telmisartan-hydrochlorothiazide (MICARDIS HCT) 80-25 MG tablet Take 1 tablet by mouth daily. 90 tablet 2  ? ?No current facility-administered medications for this visit.  ? ? ?Allergies:   Patient has no known allergies.  ? ? ?Social History:  The patient  reports that she has never smoked. She has never  used smokeless tobacco. She reports that she does not drink alcohol and does not use drugs.  ? ?Family History:  The patient's family history includes Coronary artery disease in her maternal grandmother; Diabetes in her father and mother; Hypertension in her father, maternal grandfather, maternal grandmother, and mother; Sarcoidosis in her father.  ? ? ?ROS:  Please see the history of present illness.   Otherwise, review of systems are positive for none.   All other systems are reviewed and negative.  ? ? ?PHYSICAL EXAM: ?VS:  BP 128/86   Pulse 80   Ht '5\' 4"'  (1.626 m)   Wt 229 lb 6.4 oz (104.1 kg)   SpO2 97%   BMI 39.38 kg/m?  , BMI Body mass index is 39.38 kg/m?. ?GENERAL:  Well appearing ?HEENT:  Pupils equal round and reactive, fundi not visualized, oral mucosa unremarkable ?NECK:  No jugular venous distention, waveform within normal limits, carotid upstroke brisk and symmetric, no bruits, no thyromegaly ?LUNGS:  Clear to auscultation bilaterally ?HEART:  RRR.  PMI not displaced or sustained,S1 and S2 within normal limits, no S3, no S4, no clicks, no rubs, no murmurs ?ABD:  Flat, positive bowel sounds normal in frequency in pitch, no bruits, no rebound, no guarding, no midline pulsatile mass, no hepatomegaly, no splenomegaly ?EXT:  2 plus pulses throughout, no edema, no cyanosis no clubbing ?SKIN:  No rashes no nodules ?NEURO:  Cranial nerves II through XII grossly intact, motor grossly intact throughout ?PSYCH:  Cognitively intact, oriented to person place and time ? ? ?EKG:  EKG is not ordered today. ? ? ?Recent Labs: ?10/15/2021: Magnesium 1.8 ?11/03/2021: ALT 20; BUN 10; Creatinine 0.65; Hemoglobin 10.3; Platelets 511; Potassium 3.3; Sodium 135  ? ? ?Lipid Panel ?   ?Component Value Date/Time  ? CHOL 105 10/15/2021 1226  ? TRIG 69 10/15/2021 1226  ? HDL 39 (L) 10/15/2021 1226  ? CHOLHDL 2.7 10/15/2021 1226  ? CHOLHDL 3.2 09/09/2017 0539  ? VLDL 21 09/09/2017 0539  ? LDLCALC 51 10/15/2021 1226  ? ?  ? ?Wt  Readings from Last 3 Encounters:  ?12/08/21 229 lb 6.4 oz (104.1 kg)  ?11/24/21 231 lb 6.4 oz (105 kg)  ?11/17/21 226 lb (102.5 kg)  ?  ? ? ?ASSESSMENT AND PLAN: ?# Resistant HTN:  ?Ms. Kozub's BP is nearly controlled.  She is on multiple agents and has been slightly above goal at times.  It is much better on repeat.  For now we will continue amlodipine, telmisartan, HCTZ, and metoprolol.  She is going to work on increasing her exercise and diet.  If it remains elevated we will need to consider a secondary work-up. ? ?# Palpitations:  ?  Her symptoms sound like PACs and PVCs.  She has both iron deficiency and B12 anemia.  There is no clear reason for her to be on aspirin.  She had normal cardiac cath in 2018.  We will stop her aspirin.  I suspect that as her anemia continues to be treated her palpitations will improve.  There are no alarm symptoms.  We will have a 7-day ZIO just to make sure were not missing anything.  Agree with getting a sleep study as well ? ?# Obesity:  ?Recommended increased exercise and working on diet.  ? ? ?Current medicines are reviewed at length with the patient today.  The patient does not have concerns regarding medicines. ? ?The following changes have been made:  no change ? ?Labs/ tests ordered today include:  ? ?Orders Placed This Encounter  ?Procedures  ? LONG TERM MONITOR (3-14 DAYS)  ? ? ? ?Disposition:   FU with APP in 2 months.   ? ? ? ?Signed, ?Yarieliz Wasser C. Oval Linsey, MD, [facility]  ?12/08/2021 8:29 PM    ?[place] ?

## 2021-12-08 NOTE — Patient Instructions (Addendum)
Medication Instructions:  ?STOP ASPIRIN  ? ?*If you need a refill on your cardiac medications before your next appointment, please call your pharmacy* ? ?Lab Work: ?NONE  ? ?Testing/Procedures: ?7 DAY ZIO  ?THIS WILL BE MAILED TO YOU  ? ?Follow-Up: ?At [facility], you and your health needs are our priority.  As part of our continuing mission to provide you with exceptional heart care, we have created designated Provider Care Teams.  These Care Teams include your primary Cardiologist (physician) and Advanced Practice Providers (APPs -  Physician Assistants and Nurse Practitioners) who all work together to provide you with the care you need, when you need it. ? ?We recommend signing up for the patient portal called "MyChart".  Sign up information is provided on this After Visit Summary.  MyChart is used to connect with patients for Virtual Visits (Telemedicine).  Patients are able to view lab/test results, encounter notes, upcoming appointments, etc.  Non-urgent messages can be sent to your provider as well.   ?To learn more about what you can do with MyChart, go to [email or web address].   ? ?Your next appointment:   ?2 month(s) ? ?The format for your next appointment:   ?In Person ? ?Provider:   ?Laurann Montana, NP or Coletta Memos, NP{ ? ?ZIO XT- Long Term Monitor Instructions ? ?Your physician has requested you wear a ZIO patch monitor for 7 days.  ?This is a single patch monitor. [employer] supplies one patch monitor per enrollment. Additional ?stickers are not available. Please do not apply patch if you will be having a Nuclear Stress Test,  ?Echocardiogram, Cardiac CT, MRI, or Chest Xray during the period you would be wearing the  ?monitor. The patch cannot be worn during these tests. You cannot remove and re-apply the  ?ZIO XT patch monitor.  ?Your ZIO patch monitor will be mailed 3 day USPS to your address on file. It may take 3-5 days  ?to receive your monitor after you have been enrolled.  ?Once you have  received your monitor, please review the enclosed instructions. Your monitor  ?has already been registered assigning a specific monitor serial # to you. ? ?Billing and Patient Assistance Program Information ? ?We have supplied [employer] with any of your insurance information on file for billing purposes. ?[employer] offers a sliding scale Patient Assistance Program for patients that do not have  ?insurance, or whose insurance does not completely cover the cost of the ZIO monitor.  ?You must apply for the Patient Assistance Program to qualify for this discounted rate.  ?To apply, please call [employer] at [phone], select option 4, select option 2, ask to apply for  ?Patient Assistance Program. Theodore Demark will ask your household income, and how many people  ?are in your household. They will quote your out-of-pocket cost based on that information.  ?[employer] will also be able to set up a 64-month interest-free payment plan if needed. ? ?Applying the monitor ?  ?Shave hair from upper left chest.  ?Hold abrader disc by orange tab. Rub abrader in 40 strokes over the upper left chest as  ?indicated in your monitor instructions.  ?Clean area with 4 enclosed alcohol pads. Let dry.  ?Apply patch as indicated in monitor instructions. Patch will be placed under collarbone on left  ?side of chest with arrow pointing upward.  ?Rub patch adhesive wings for 2 minutes. Remove Ozer label marked "1". Remove the Nicolini  ?label marked "2". Rub patch adhesive wings for 2 additional minutes.  ?While looking in  a mirror, press and release button in center of patch. A small green light will  ?flash 3-4 times. This will be your only indicator that the monitor has been turned on.  ?Do not shower for the first 24 hours. You may shower after the first 24 hours.  ?Press the button if you feel a symptom. You will hear a small click. Record Date, Time and  ?Symptom in the Patient Logbook.  ?When you are ready to remove the patch, follow instructions on  the last 2 pages of Patient  ?Logbook. Stick patch monitor onto the last page of Patient Logbook.  ?Place Patient Logbook in the blue and [place] box. Use locking tab on box and tape box closed  ?securely. The blue and [place] box has prepaid postage on it. Please place it in the mailbox as  ?soon as possible. Your physician should have your test results approximately 7 days after the  ?monitor has been mailed back to [facility].  ?Call [facility] at [phone] if you have questions regarding  ?your ZIO XT patch monitor. Call them immediately if you see an orange light blinking on your  ?monitor.  ?If your monitor falls off in less than 4 days, contact our Monitor department at [phone].  ?If your monitor becomes loose or falls off after 4 days call [employer] at [phone] for  ?suggestions on securing your monitor ? ?

## 2021-12-09 ENCOUNTER — Inpatient Hospital Stay

## 2021-12-09 VITALS — BP 132/88 | HR 69 | Temp 98.6°F | Resp 18

## 2021-12-09 DIAGNOSIS — D75838 Other thrombocytosis: Secondary | ICD-10-CM | POA: Diagnosis not present

## 2021-12-09 DIAGNOSIS — D500 Iron deficiency anemia secondary to blood loss (chronic): Secondary | ICD-10-CM

## 2021-12-09 MED ORDER — SODIUM CHLORIDE 0.9 % IV SOLN
300.0000 mg | Freq: Once | INTRAVENOUS | Status: AC
  Administered 2021-12-09: 300 mg via INTRAVENOUS
  Filled 2021-12-09: qty 300

## 2021-12-09 MED ORDER — ACETAMINOPHEN 325 MG PO TABS
650.0000 mg | ORAL_TABLET | Freq: Once | ORAL | Status: AC
  Administered 2021-12-09: 650 mg via ORAL
  Filled 2021-12-09: qty 2

## 2021-12-09 MED ORDER — LORATADINE 10 MG PO TABS
10.0000 mg | ORAL_TABLET | Freq: Once | ORAL | Status: AC
  Administered 2021-12-09: 10 mg via ORAL
  Filled 2021-12-09: qty 1

## 2021-12-09 MED ORDER — SODIUM CHLORIDE 0.9 % IV SOLN: Freq: Once | INTRAVENOUS | Status: AC

## 2021-12-09 NOTE — Patient Instructions (Signed)

## 2021-12-09 NOTE — Progress Notes (Signed)
Pt refused 30 minute observation period post Venofer infusion. VSS. Pt had no complaints at time of discharge.  ?

## 2021-12-15 ENCOUNTER — Other Ambulatory Visit: Payer: Self-pay

## 2021-12-15 ENCOUNTER — Inpatient Hospital Stay

## 2021-12-15 ENCOUNTER — Encounter: Payer: Self-pay | Admitting: Hematology

## 2021-12-15 VITALS — BP 125/74 | HR 72 | Temp 98.9°F | Resp 16

## 2021-12-15 DIAGNOSIS — D75838 Other thrombocytosis: Secondary | ICD-10-CM | POA: Diagnosis not present

## 2021-12-15 DIAGNOSIS — D500 Iron deficiency anemia secondary to blood loss (chronic): Secondary | ICD-10-CM

## 2021-12-15 MED ORDER — SODIUM CHLORIDE 0.9 % IV SOLN
300.0000 mg | Freq: Once | INTRAVENOUS | Status: AC
  Administered 2021-12-15: 300 mg via INTRAVENOUS
  Filled 2021-12-15: qty 300

## 2021-12-15 MED ORDER — LORATADINE 10 MG PO TABS
10.0000 mg | ORAL_TABLET | Freq: Once | ORAL | Status: AC
  Administered 2021-12-15: 10 mg via ORAL
  Filled 2021-12-15: qty 1

## 2021-12-15 MED ORDER — SODIUM CHLORIDE 0.9 % IV SOLN: Freq: Once | INTRAVENOUS | Status: AC

## 2021-12-15 MED ORDER — ACETAMINOPHEN 325 MG PO TABS
650.0000 mg | ORAL_TABLET | Freq: Once | ORAL | Status: AC
  Administered 2021-12-15: 650 mg via ORAL
  Filled 2021-12-15: qty 2

## 2021-12-15 NOTE — Patient Instructions (Signed)

## 2021-12-15 NOTE — Progress Notes (Signed)
Pt declined to stay for 30 min observation. VSS at d/c and pt ambulatory to lobby without further questions or concerns.  ?

## 2021-12-29 ENCOUNTER — Ambulatory Visit

## 2022-01-05 ENCOUNTER — Ambulatory Visit

## 2022-01-11 DIAGNOSIS — R002 Palpitations: Secondary | ICD-10-CM

## 2022-01-26 ENCOUNTER — Institutional Professional Consult (permissible substitution): Admitting: Neurology

## 2022-02-10 ENCOUNTER — Encounter (HOSPITAL_BASED_OUTPATIENT_CLINIC_OR_DEPARTMENT_OTHER): Payer: Self-pay | Admitting: Family

## 2022-02-10 ENCOUNTER — Ambulatory Visit (INDEPENDENT_AMBULATORY_CARE_PROVIDER_SITE_OTHER): Admitting: Family

## 2022-02-10 VITALS — BP 145/89 | HR 76 | Ht 64.0 in

## 2022-02-10 DIAGNOSIS — E782 Mixed hyperlipidemia: Secondary | ICD-10-CM | POA: Diagnosis not present

## 2022-02-10 DIAGNOSIS — R002 Palpitations: Secondary | ICD-10-CM

## 2022-02-10 DIAGNOSIS — I10 Essential (primary) hypertension: Secondary | ICD-10-CM | POA: Diagnosis not present

## 2022-02-10 NOTE — Patient Instructions (Signed)
Medication Instructions:  Continue your current medications.   If your blood pressure consistently is high at follow up we may consider increasing your dose of Amlodipine. For now, continue current medications.   *If you need a refill on your cardiac medications before your next appointment, please call your pharmacy*   Lab Work: None ordered today.   Testing/Procedures: Your monitor showed mostly normal sinus rhythm and only rare early beats which are not dangerous and not of concern.   Follow-Up: At [facility], you and your health needs are our priority.  As part of our continuing mission to provide you with exceptional heart care, we have created designated Provider Care Teams.  These Care Teams include your primary Cardiologist (physician) and Advanced Practice Providers (APPs -  Physician Assistants and Nurse Practitioners) who all work together to provide you with the care you need, when you need it.  We recommend signing up for the patient portal called "MyChart".  Sign up information is provided on this After Visit Summary.  MyChart is used to connect with patients for Virtual Visits (Telemedicine).  Patients are able to view lab/test results, encounter notes, upcoming appointments, etc.  Non-urgent messages can be sent to your provider as well.   To learn more about what you can do with MyChart, go to [email or web address].    Your next appointment:   In August with Dr. Oval Linsey as scheduled   Other Instructions  Heart Healthy Diet Recommendations: A low-salt diet is recommended. Meats should be grilled, baked, or boiled. Avoid fried foods. Focus on lean protein sources like fish or chicken with vegetables and fruits. The American Heart Association is a Microbiologist!  American Heart Association Diet and Lifeystyle Recommendations   Exercise recommendations: The American Heart Association recommends 150 minutes of moderate intensity exercise weekly. Try 30 minutes of  moderate intensity exercise 4-5 times per week. This could include walking, jogging, or swimming.   Important Information About Sugar

## 2022-02-10 NOTE — Progress Notes (Signed)
Virtual Visit via Telephone Note   Because of Gabrielle Perkins's co-morbid illnesses, she is at least at moderate risk for complications without adequate follow up.  This format is felt to be most appropriate for this patient at this time.  The patient did not have access to video technology/had technical difficulties with video requiring transitioning to audio format only (telephone).  All issues noted in this document were discussed and addressed.  No physical exam could be performed with this format.  Please refer to the patient's chart for her consent to telehealth for [facility].    Date:  02/10/2022   ID:  Gabrielle Perkins, DOB 1975/11/30, MRN [id number] The patient was identified using 2 identifiers.  Patient Location: Home Provider Location: Office/Clinic   PCP:  Glendale Chard, MD   [place] Prov[facility] Cardiologist:  Skeet Latch, MD     Evaluation Performed:  Follow-Up Visit  Chief Complaint: Follow-up of palpitations  History of Present Illness:    Gabrielle Perkins is a 46 y.o. female with hypertension, diabetes, iron deficiency anemia, GERD, palpitations.  Prior cardiac catheterization in 2018 with normal coronary arteries.  She established with Dr. Tommie Raymond 12/08/2021 due to palpitations at the request of her primary care provider.  She noted sensation of heart skipping a beat and making her feel anxious.  She had minimal caffeine intake.  She had been referred for sleep study by her primary care provider.  She did note she was not exercising much and was hoping to start walking more.  ZIO monitor placed in clinic.  Her aspirin was discontinued as she did not have any evidence of prior coronary disease or indication for daily aspirin.  Preliminary monitor up for worn for 22 hours revealed sinus rhythm average heart rate 75 bpm with PVC/PAC burden less than 1%.  She presents today for follow-up.  Tells me she was only able to wear the monitor for couple of days  that she had a skin reaction which has since mostly healed.  Her palpitations have not been bothersome. She has sleep study upcoming in June. BP has been labile at home most often in the 130s-140s and occasionally less than 130. Takes all of her blood pressure medication in the morning. Reports no shortness of breath at rest and stable dyspnea on exertion which she attributes to inactivity.  She is interested in starting an exercise regimen.. Reports no chest pain, pressure, or tightness. No edema, orthopnea, PND. Reports no palpitations.      Past Medical History:  Diagnosis Date   Diabetes mellitus without complication ([place])    GERD (gastroesophageal reflux disease)    Hypertension    Past Surgical History:  Procedure Laterality Date   btl     LEFT HEART CATH AND CORONARY ANGIOGRAPHY N/A 09/12/2017   Procedure: LEFT HEART CATH AND CORONARY ANGIOGRAPHY;  Surgeon: Dixie Dials, MD;  Location: [place] [facility];  Service: Cardiovascular;  Laterality: N/A;   LIPOMA EXCISION N/A 10/04/2019   Procedure: EXCISION OF FOREHEAD LIPOMA;  Surgeon: Cindra Presume, MD;  Location: [place];  Service: Plastics;  Laterality: N/A;  45 min, please   TUBAL LIGATION      Current Meds  Medication Sig   acetaminophen (TYLENOL) 500 MG tablet Take 500 mg by mouth every 6 (six) hours as needed.   amLODipine (NORVASC) 5 MG tablet Take 1 tablet (5 mg total) by mouth daily.   Blood Glucose Monitoring Suppl (FREESTYLE FREEDOM LITE) w/Device  KIT Use as directed to check blood sugars 2 times per day dx: e11.22   cetirizine (ZYRTEC) 10 MG tablet Take 1 tablet (10 mg total) by mouth daily.   fluticasone (FLONASE) 50 MCG/ACT nasal spray Place 2 sprays into both nostrils daily.   glucose blood (FREESTYLE LITE) test strip Use as instructed to check blood sugars 2 times per day dx: e11.22   Iron-FA-B Cmp-C-Biot-Probiotic (FUSION PLUS) CAPS One capsule po qd   levonorgestrel (MIRENA, 52 MG,) 20 MCG/DAY IUD  Mirena 20 mcg/24 hours (7 yrs) 52 mg intrauterine device  Take 1 device by intrauterine route.   metFORMIN (GLUCOPHAGE-XR) 500 MG 24 hr tablet Take 2 tablets (1,000 mg total) by mouth daily with supper.   metoprolol succinate (TOPROL XL) 50 MG 24 hr tablet Take 1 tablet (50 mg total) by mouth daily.   Microlet Lancets MISC Use as directed to check blood sugars 2 times per day dx: e11.22   telmisartan-hydrochlorothiazide (MICARDIS HCT) 80-25 MG tablet Take 1 tablet by mouth daily.    Allergies:   Patient has no known allergies.   Social History   Tobacco Use   Smoking status: Never   Smokeless tobacco: Never  Vaping Use   Vaping Use: Never used  Substance Use Topics   Alcohol use: No   Drug use: Never    Family Hx: The patient's family history includes Coronary artery disease in her maternal grandmother; Diabetes in her father and mother; Hypertension in her father, maternal grandfather, maternal grandmother, and mother; Sarcoidosis in her father. There is no history of Breast cancer.  ROS:   Please see the history of present illness.     All other systems reviewed and are negative.   Labs/Other Tests and Data Reviewed:    EKG:  No ECG reviewed.  Recent Labs: 10/15/2021: Magnesium 1.8 11/03/2021: ALT 20; BUN 10; Creatinine 0.65; Hemoglobin 10.3; Platelets 511; Potassium 3.3; Sodium 135   Recent Lipid Panel Lab Results  Component Value Date/Time   CHOL 105 10/15/2021 12:26 PM   TRIG 69 10/15/2021 12:26 PM   HDL 39 (L) 10/15/2021 12:26 PM   CHOLHDL 2.7 10/15/2021 12:26 PM   CHOLHDL 3.2 09/09/2017 05:39 AM   LDLCALC 51 10/15/2021 12:26 PM    Wt Readings from Last 3 Encounters:  12/08/21 229 lb 6.4 oz (104.1 kg)  11/24/21 231 lb 6.4 oz (105 kg)  11/17/21 226 lb (102.5 kg)         Objective:    Vital Signs:  BP (!) 145/89   Pulse 76   Ht '5\' 4"'  (1.626 m)   BMI 39.38 kg/m    VITAL SIGNS:  reviewed  ASSESSMENT & PLAN:    Resistant hypertension -upcoming sleep  study June 2023.  Blood pressure at home most often 130s-140s.  Does occasionally get readings less than 130/80.  We will continue current Toprol 50 mg daily, telmisartan-HCTZ 80-25 mg daily, amlodipine 5 mg daily.  She is starting exercise program through prep at the [facility].  Anticipate this will help get her blood pressure to goal of less than 130/80.  However will have prompt follow-up in a few months to reassess and if needed can increase her amlodipine to 10 mg at that time.  Palpitations - No longer bothersome.  Was only able to wear a monitor for 1 day due to skin irritation.  Monitor with probably normal sinus rhythm, rare PVC/PAC less than 1% burden.  Reassurance provided.  No indication for medical therapy at this  time.  Encouraged to stay well-hydrated, avoid caffeine, manage stress well.  Obesity - Weight loss via diet and exercise encouraged. Discussed the impact being overweight would have on cardiovascular risk. Referred to PREP exercise program at the [facility].  Time:   Today, I have spent 12 minutes with the patient with telehealth technology discussing the above problems.     Medication Adjustments/Labs and Tests Ordered: Current medicines are reviewed at length with the patient today.  Concerns regarding medicines are outlined above.   Tests Ordered: Orders Placed This Encounter  Procedures   Amb Referral To Provider Referral Exercise Program (P.R.E.P)    Medication Changes: No orders of the defined types were placed in this encounter.   Follow Up:  In Person in 2 month(s)  Signed, Loel Dubonnet, NP  02/10/2022 5:01 PM    [facility]

## 2022-02-15 ENCOUNTER — Telehealth: Payer: Self-pay

## 2022-02-15 NOTE — Telephone Encounter (Signed)
Called to discuss PREP program referral, she works M-F in [employer] so only class that she could attend is at [employer]. Advised that June class is full so will let her know when next evening class will start later this year.

## 2022-02-17 ENCOUNTER — Encounter: Payer: Self-pay | Admitting: Nurse Practitioner

## 2022-02-17 ENCOUNTER — Other Ambulatory Visit: Payer: Self-pay

## 2022-02-17 ENCOUNTER — Telehealth (INDEPENDENT_AMBULATORY_CARE_PROVIDER_SITE_OTHER): Admitting: Nurse Practitioner

## 2022-02-17 DIAGNOSIS — E538 Deficiency of other specified B group vitamins: Secondary | ICD-10-CM

## 2022-02-17 DIAGNOSIS — E1165 Type 2 diabetes mellitus with hyperglycemia: Secondary | ICD-10-CM | POA: Diagnosis not present

## 2022-02-17 DIAGNOSIS — M25571 Pain in right ankle and joints of right foot: Secondary | ICD-10-CM

## 2022-02-17 DIAGNOSIS — M25561 Pain in right knee: Secondary | ICD-10-CM | POA: Diagnosis not present

## 2022-02-17 DIAGNOSIS — I10 Essential (primary) hypertension: Secondary | ICD-10-CM | POA: Diagnosis not present

## 2022-02-17 MED ORDER — METFORMIN HCL ER 500 MG PO TB24: 1000.0000 mg | ORAL_TABLET | Freq: Every day | ORAL | 2 refills | Status: DC

## 2022-02-17 MED ORDER — TELMISARTAN-HCTZ 80-25 MG PO TABS: 1.0000 | ORAL_TABLET | Freq: Every day | ORAL | 2 refills | Status: DC

## 2022-02-17 MED ORDER — FLUTICASONE PROPIONATE 50 MCG/ACT NA SUSP: 2.0000 | Freq: Every day | NASAL | 2 refills | Status: DC

## 2022-02-17 MED ORDER — OMEPRAZOLE 40 MG PO CPDR: 40.0000 mg | DELAYED_RELEASE_CAPSULE | Freq: Every day | ORAL | 2 refills | Status: DC

## 2022-02-17 MED ORDER — DICLOFENAC SODIUM 1 % EX GEL: 2.0000 g | Freq: Four times a day (QID) | CUTANEOUS | 2 refills | Status: DC

## 2022-02-17 MED ORDER — AMLODIPINE BESYLATE 5 MG PO TABS: 5.0000 mg | ORAL_TABLET | Freq: Every day | ORAL | 2 refills | Status: DC

## 2022-02-17 MED ORDER — METOPROLOL SUCCINATE ER 50 MG PO TB24: 50.0000 mg | ORAL_TABLET | Freq: Every day | ORAL | 2 refills | Status: DC

## 2022-02-17 MED ORDER — CETIRIZINE HCL 10 MG PO TABS: 10.0000 mg | ORAL_TABLET | Freq: Every day | ORAL | 2 refills | Status: DC

## 2022-02-17 NOTE — Patient Instructions (Signed)

## 2022-02-17 NOTE — Progress Notes (Signed)
Virtual Visit via Mychart   This visit type was conducted due to national recommendations for restrictions regarding the COVID-19 Pandemic (e.g. social distancing) in an effort to limit this patient's exposure and mitigate transmission in our community.  Due to her co-morbid illnesses, this patient is at least at moderate risk for complications without adequate follow up.  This format is felt to be most appropriate for this patient at this time.  All issues noted in this document were discussed and addressed.  A limited physical exam was performed with this format.    This visit type was conducted due to national recommendations for restrictions regarding the COVID-19 Pandemic (e.g. social distancing) in an effort to limit this patient's exposure and mitigate transmission in our community.  Patients identity confirmed using two different identifiers.  This format is felt to be most appropriate for this patient at this time.  All issues noted in this document were discussed and addressed.  No physical exam was performed (except for noted visual exam findings with Video Visits).    Date:  03/03/2022   ID:  Gabrielle Perkins, DOB 02/14/1976, MRN [id number]  Patient Location:  Home - spoke with Gabrielle Perkins - work  Provider location:   [employer] Complaint:  Htn and diabetes f/u  History of Present Illness:    Gabrielle Perkins is a 46 y.o. female who presents via video conferencing for a telehealth visit today.    The patient does not have symptoms concerning for COVID-19 infection (fever, chills, cough, or new shortness of breath).   She presents today for diabetes and blood pressure f/u. She reports compliance with meds. Denies headaches, chest pain and shortness of breath.    Never seen an orthopedic. She is feeling the pain more regularly.   Diabetes She presents for her follow-up diabetic visit. She has type 2 diabetes mellitus. There are no hypoglycemic associated symptoms.  There are no diabetic associated symptoms. There are no hypoglycemic complications. Risk factors for coronary artery disease include diabetes mellitus, dyslipidemia, hypertension, obesity and sedentary lifestyle. Current diabetic treatment includes oral agent (monotherapy). She is compliant with treatment some of the time. She is following a generally healthy diet. She participates in exercise intermittently. Her breakfast blood glucose is taken between 8-9 am. Her breakfast blood glucose range is generally 90-110 mg/dl. (97/-120 blood sugar) Eye exam is current.  Hypertension This is a chronic problem. The current episode started more than 1 year ago. The problem has been gradually improving since onset. The problem is controlled. Past treatments include angiotensin blockers and diuretics. The current treatment provides moderate improvement. Compliance problems include exercise.      Past Medical History:  Diagnosis Date   Diabetes mellitus without complication ([place])    GERD (gastroesophageal reflux disease)    Hypertension    Past Surgical History:  Procedure Laterality Date   btl     LEFT HEART CATH AND CORONARY ANGIOGRAPHY N/A 09/12/2017   Procedure: LEFT HEART CATH AND CORONARY ANGIOGRAPHY;  Surgeon: Dixie Dials, MD;  Location: [place] [facility];  Service: Cardiovascular;  Laterality: N/A;   LIPOMA EXCISION N/A 10/04/2019   Procedure: EXCISION OF FOREHEAD LIPOMA;  Surgeon: Cindra Presume, MD;  Location: [place];  Service: Plastics;  Laterality: N/A;  45 min, please   TUBAL LIGATION       Current Meds  Medication Sig   acetaminophen (TYLENOL) 500 MG tablet Take 500 mg by mouth every 6 (  six) hours as needed.   Blood Glucose Monitoring Suppl (FREESTYLE FREEDOM LITE) w/Device KIT Use as directed to check blood sugars 2 times per day dx: e11.22   diclofenac Sodium (VOLTAREN) 1 % GEL Apply 2 g topically 4 (four) times daily.   glucose blood (FREESTYLE LITE) test strip  Use as instructed to check blood sugars 2 times per day dx: e11.22   Iron-FA-B Cmp-C-Biot-Probiotic (FUSION PLUS) CAPS One capsule po qd   levonorgestrel (MIRENA, 52 MG,) 20 MCG/DAY IUD Mirena 20 mcg/24 hours (7 yrs) 52 mg intrauterine device  Take 1 device by intrauterine route.   Microlet Lancets MISC Use as directed to check blood sugars 2 times per day dx: e11.22   [DISCONTINUED] amLODipine (NORVASC) 5 MG tablet Take 1 tablet (5 mg total) by mouth daily.   [DISCONTINUED] cetirizine (ZYRTEC) 10 MG tablet Take 1 tablet (10 mg total) by mouth daily.   [DISCONTINUED] fluticasone (FLONASE) 50 MCG/ACT nasal spray Place 2 sprays into both nostrils daily.   [DISCONTINUED] metFORMIN (GLUCOPHAGE-XR) 500 MG 24 hr tablet Take 2 tablets (1,000 mg total) by mouth daily with supper.   [DISCONTINUED] metoprolol succinate (TOPROL XL) 50 MG 24 hr tablet Take 1 tablet (50 mg total) by mouth daily.   [DISCONTINUED] omeprazole (PRILOSEC) 40 MG capsule Take 1 capsule (40 mg total) by mouth daily.   [DISCONTINUED] telmisartan-hydrochlorothiazide (MICARDIS HCT) 80-25 MG tablet Take 1 tablet by mouth daily.     Allergies:   Patient has no known allergies.   Social History   Tobacco Use   Smoking status: Never   Smokeless tobacco: Never  Vaping Use   Vaping Use: Never used  Substance Use Topics   Alcohol use: No   Drug use: Never     Family Hx: The patient's family history includes Coronary artery disease in her maternal grandmother; Diabetes in her father and mother; Hypertension in her father, maternal grandfather, maternal grandmother, and mother; Sarcoidosis in her father. There is no history of Breast cancer.  ROS:   Please see the history of present illness.    Review of Systems  Constitutional: Negative.   Respiratory: Negative.    Cardiovascular: Negative.   Gastrointestinal: Negative.   Musculoskeletal:  Positive for joint pain (right knee will give away and right ankle swelling and an  aching/throbbing pain - symptoms began 2 months. Has taken Tyelnol for the pain. She has been applying heat/ice.).  Neurological: Negative.   Psychiatric/Behavioral: Negative.      All other systems reviewed and are negative.   Labs/Other Tests and Data Reviewed:    Recent Labs: 10/15/2021: Magnesium 1.8 11/03/2021: ALT 20; BUN 10; Creatinine 0.65; Hemoglobin 10.3; Platelets 511; Potassium 3.3; Sodium 135   Recent Lipid Panel Lab Results  Component Value Date/Time   CHOL 105 10/15/2021 12:26 PM   TRIG 69 10/15/2021 12:26 PM   HDL 39 (L) 10/15/2021 12:26 PM   CHOLHDL 2.7 10/15/2021 12:26 PM   CHOLHDL 3.2 09/09/2017 05:39 AM   LDLCALC 51 10/15/2021 12:26 PM    Wt Readings from Last 3 Encounters:  12/08/21 229 lb 6.4 oz (104.1 kg)  11/24/21 231 lb 6.4 oz (105 kg)  11/17/21 226 lb (102.5 kg)     Exam:    Vital Signs:  There were no vitals taken for this visit.    Physical Exam Constitutional:      General: She is not in acute distress.    Appearance: Normal appearance.  Pulmonary:     Effort: Pulmonary effort  is normal. No respiratory distress.  Neurological:     General: No focal deficit present.     Mental Status: She is alert and oriented to person, place, and time. Mental status is at baseline.     Cranial Nerves: No cranial nerve deficit.  Psychiatric:        Mood and Affect: Mood and affect normal.        Behavior: Behavior normal.        Thought Content: Thought content normal.        Cognition and Memory: Memory normal.        Judgment: Judgment normal.     ASSESSMENT & PLAN:    1. Uncontrolled type 2 diabetes mellitus with hyperglycemia ([place]) She is tolerating metformin well. Continue diet low in starches and carbohydrates. Exercise as tolerated at least 150 minutes a week - Hemoglobin A1c; Future - CMP14+EGFR; Future - Lipid panel; Future  2. Essential hypertension, benign Continue current medications, she will come in for eGFR for kidney  functions - CMP14+EGFR; Future  3. Acute pain of right knee She will go for an xray and treated with pain cream - diclofenac Sodium (VOLTAREN) 1 % GEL; Apply 2 g topically 4 (four) times daily.  Dispense: 100 g; Refill: 2 - DG Knee Complete 4 Views Right; Future  4. Acute right ankle pain  - diclofenac Sodium (VOLTAREN) 1 % GEL; Apply 2 g topically 4 (four) times daily.  Dispense: 100 g; Refill: 2 - DG Ankle Complete Right; Future  5. Vitamin B12 deficiency Will check vitamin B12 - Vitamin B12; Future - CBC; Future   COVID-19 Education: The signs and symptoms of COVID-19 were discussed with the patient and how to seek care for testing (follow up with PCP or arrange E-visit).  The importance of social distancing was discussed today.  Patient Risk:   After full review of this patients clinical status, I feel that they are at least moderate risk at this time.  Time:   Today, I have spent 11.27 minutes/ seconds with the patient with telehealth technology discussing above diagnoses.     Medication Adjustments/Labs and Tests Ordered: Current medicines are reviewed at length with the patient today.  Concerns regarding medicines are outlined above.   Tests Ordered: Orders Placed This Encounter  Procedures   DG Knee Complete 4 Views Right   DG Ankle Complete Right   Hemoglobin A1c   CMP14+EGFR   Vitamin B12   Lipid panel   CBC    Medication Changes: Meds ordered this encounter  Medications   diclofenac Sodium (VOLTAREN) 1 % GEL    Sig: Apply 2 g topically 4 (four) times daily.    Dispense:  100 g    Refill:  2    Disposition:  Follow up in 3 month(s)  Signed, Minette Brine, FNP

## 2022-03-03 ENCOUNTER — Telehealth: Payer: Self-pay | Admitting: Hematology

## 2022-03-03 NOTE — Telephone Encounter (Signed)
.  Called patient to schedule appointment per 6/14 inbasket, patient is aware of date and time.

## 2022-03-08 ENCOUNTER — Other Ambulatory Visit

## 2022-03-08 ENCOUNTER — Ambulatory Visit: Payer: Self-pay | Admitting: Hematology

## 2022-03-10 ENCOUNTER — Ambulatory Visit: Payer: Self-pay | Admitting: Hematology

## 2022-03-10 ENCOUNTER — Other Ambulatory Visit: Payer: Self-pay

## 2022-03-11 ENCOUNTER — Other Ambulatory Visit: Payer: Self-pay

## 2022-03-11 ENCOUNTER — Ambulatory Visit
Admission: RE | Admit: 2022-03-11 | Discharge: 2022-03-11 | Disposition: A | Discharge status: Home / Self Care | Source: Ambulatory Visit | Attending: Nurse Practitioner | Admitting: Nurse Practitioner

## 2022-03-11 DIAGNOSIS — M25561 Pain in right knee: Secondary | ICD-10-CM

## 2022-03-11 DIAGNOSIS — E1165 Type 2 diabetes mellitus with hyperglycemia: Secondary | ICD-10-CM

## 2022-03-11 DIAGNOSIS — E538 Deficiency of other specified B group vitamins: Secondary | ICD-10-CM

## 2022-03-11 DIAGNOSIS — M25571 Pain in right ankle and joints of right foot: Secondary | ICD-10-CM

## 2022-03-11 DIAGNOSIS — I10 Essential (primary) hypertension: Secondary | ICD-10-CM

## 2022-03-11 LAB — CBC
Hematocrit: 39.8 % (ref 34.0–46.6)
Hemoglobin: 12.9 g/dL (ref 11.1–15.9)
MCH: 28.4 pg (ref 26.6–33.0)
MCHC: 32.4 g/dL (ref 31.5–35.7)
MCV: 88 fL (ref 79–97)
Platelets: 381 10*3/uL (ref 150–450)
RBC: 4.55 x10E6/uL (ref 3.77–5.28)
RDW: 14.2 % (ref 11.7–15.4)
WBC: 5.5 10*3/uL (ref 3.4–10.8)

## 2022-03-12 LAB — CMP14+EGFR
ALT: 27 IU/L (ref 0–32)
AST: 23 IU/L (ref 0–40)
Albumin/Globulin Ratio: 1.3 (ref 1.2–2.2)
Albumin: 4.3 g/dL (ref 3.8–4.8)
Alkaline Phosphatase: 109 IU/L (ref 44–121)
BUN/Creatinine Ratio: 9 (ref 9–23)
BUN: 7 mg/dL (ref 6–24)
Bilirubin Total: 0.2 mg/dL (ref 0.0–1.2)
CO2: 25 mmol/L (ref 20–29)
Calcium: 9.5 mg/dL (ref 8.7–10.2)
Chloride: 101 mmol/L (ref 96–106)
Creatinine, Ser: 0.75 mg/dL (ref 0.57–1.00)
Globulin, Total: 3.2 g/dL (ref 1.5–4.5)
Glucose: 227 mg/dL — ABNORMAL HIGH (ref 70–99)
Potassium: 3.8 mmol/L (ref 3.5–5.2)
Sodium: 140 mmol/L (ref 134–144)
Total Protein: 7.5 g/dL (ref 6.0–8.5)
eGFR: 101 mL/min/{1.73_m2} (ref >59)

## 2022-03-12 LAB — LIPID PANEL
Chol/HDL Ratio: 3.1 ratio (ref 0.0–4.4)
Cholesterol, Total: 124 mg/dL (ref 100–199)
HDL: 40 mg/dL (ref >39)
LDL Chol Calc (NIH): 63 mg/dL (ref 0–99)
Triglycerides: 113 mg/dL (ref 0–149)
VLDL Cholesterol Cal: 21 mg/dL (ref 5–40)

## 2022-03-12 LAB — VITAMIN B12: Vitamin B-12: 427 pg/mL (ref 232–1245)

## 2022-03-12 LAB — HEMOGLOBIN A1C
Est. average glucose Bld gHb Est-mCnc: 186 mg/dL
Hgb A1c MFr Bld: 8.1 % — ABNORMAL HIGH (ref 4.8–5.6)

## 2022-03-13 ENCOUNTER — Other Ambulatory Visit: Payer: Self-pay | Admitting: Nurse Practitioner

## 2022-03-13 DIAGNOSIS — M25571 Pain in right ankle and joints of right foot: Secondary | ICD-10-CM

## 2022-03-17 ENCOUNTER — Encounter: Payer: Self-pay | Admitting: Neurology

## 2022-03-17 ENCOUNTER — Ambulatory Visit (INDEPENDENT_AMBULATORY_CARE_PROVIDER_SITE_OTHER): Admitting: Neurology

## 2022-03-17 VITALS — BP 142/94 | HR 86 | Ht 64.0 in | Wt 234.5 lb

## 2022-03-17 DIAGNOSIS — R0681 Apnea, not elsewhere classified: Secondary | ICD-10-CM | POA: Diagnosis not present

## 2022-03-17 DIAGNOSIS — F519 Sleep disorder not due to a substance or known physiological condition, unspecified: Secondary | ICD-10-CM | POA: Diagnosis not present

## 2022-03-17 DIAGNOSIS — G4719 Other hypersomnia: Secondary | ICD-10-CM

## 2022-03-17 DIAGNOSIS — R519 Headache, unspecified: Secondary | ICD-10-CM

## 2022-03-17 DIAGNOSIS — R0601 Orthopnea: Secondary | ICD-10-CM

## 2022-03-17 DIAGNOSIS — D500 Iron deficiency anemia secondary to blood loss (chronic): Secondary | ICD-10-CM

## 2022-03-17 NOTE — Progress Notes (Signed)
SLEEP MEDICINE CLINIC    Provider:  Larey Seat, MD  Primary Care Physician:  Glendale Chard, [address] [place] [address]     Referring Provider: Glendale Chard, [address] [place],   [id number]          Chief Complaint according to patient   Patient presents with:     New Patient (Initial Visit)           HISTORY OF PRESENT ILLNESS:  03-17-2022: Gabrielle Perkins is a 46 y.o. African American female patient and was seen on 03/17/2022 upon referral from Dr Baird Cancer.   Chief concern according to patient :  "  Pt referred for snoring, witnessed apneic events, and Morbid obesity is her main risk factor. No prior SS. Husband on CPAP. Pt reports waking up choking in her sleep, gasping, waking her up in a panic. Daytime fatigue and sometimes morning HA. She uses 3 pillows to prop up, orthopnea for 2 years now. Had iron deficiency anemia.    She   has a past medical history of Diabetes mellitus without complication ([place]), GERD (gastroesophageal reflux disease), and Hypertension. Had 4 pregnancies, 2 with pre-eclampsia. Daughter was born at 52 weeks.    Sleep relevant medical history:  sleep choking, Nocturia on diuretics, weight gain, DM 2, no Tonsillectomy. Broken nose in HS, allergic sinusitis, rhinitis.   Family medical /sleep history:  No other biological family member on CPAP with OSA    Social history:  Patient is working as Solicitor - and lives in a household with spouse and 2 children, in college-  Tobacco use: no exposure.  ETOH use : 2 a year- Caffeine intake in form of Coffee( /) Soda( /) Tea ( sweet at lunch) ,no energy drinks. Regular exercise in form of -none.     Sleep habits are as follows: The patient's dinner time is between 6-7 PM. The patient goes to bed at 9.30-10 PM and continues to watch Tv , she will fall asleep for 4 hours, wakes for 2 bathroom breaks, the first time at 2 AM and again at 5 AM . Rises at 6.30  , waking up with alarm.   The preferred sleep position is on her right side, with the support of 3 pillows.  Dreams are reportedly frequent.  She reports often not feeling refreshed or restored in AM, with symptoms such as dry mouth, morning headaches, and residual fatigue.  Naps are not taken , she does fall asleep as a passenger.     Review of Systems: Out of a complete 14 system review, the patient complains of only the following symptoms, and all other reviewed systems are negative.:  Fatigue, sleepiness , snoring, fragmented sleep by nocturia , choking.   Postviral fatigue.  Positive COVID 19 , 2/ 2022 , felt like a head congestion.    How likely are you to doze in the following situations: 0 = not likely, 1 = slight chance, 2 = moderate chance, 3 = high chance   Sitting and Reading?2 Watching Television? 3 every night  Sitting inactive in a public place (theater or meeting)?2 As a passenger in a car for an hour without a break? 2 Lying down in the afternoon when circumstances permit?3 Sitting and talking to someone?0 Sitting quietly after lunch without alcohol?2 In a car, while stopped for a few minutes in traffic?0    Total = 14/ 24 points   FSS endorsed  at 37/ 63 points.   Social History   Socioeconomic History   Marital status: Married    Spouse name: Gabrielle Perkins   Number of children: 4   Years of education: Not on file   Highest education level: Bachelor's degree (e.g., BA, AB, BS)  Occupational History   Not on file  Tobacco Use   Smoking status: Never   Smokeless tobacco: Never  Vaping Use   Vaping Use: Never used  Substance and Sexual Activity   Alcohol use: No   Drug use: Never   Sexual activity: Not on file  Other Topics Concern   Not on file  Social History Narrative   Lives with husband and children   R handed   Caffeine: 1 sweet tea a day   Social Determinants of Health   Financial Resource Strain: Low Risk  (12/08/2021)   Overall Financial  Resource Strain (CARDIA)    Difficulty of Paying Living Expenses: Not hard at all  Food Insecurity: No Food Insecurity (12/08/2021)   Hunger Vital Sign    Worried About Running Out of Food in the Last Year: Never true    Ran Out of Food in the Last Year: Never true  Transportation Needs: No Transportation Needs (12/08/2021)   PRAPARE - Hydrologist (Medical): No    Lack of Transportation (Non-Medical): No  Physical Activity: Inactive (12/08/2021)   Exercise Vital Sign    Days of Exercise per Week: 0 days    Minutes of Exercise per Session: 0 min  Stress: Not on file  Social Connections: Not on file    Family History  Problem Relation Age of Onset   Hypertension Mother    Diabetes Mother    Hypertension Father    Diabetes Father    Sarcoidosis Father    Hypertension Maternal Grandmother    Coronary artery disease Maternal Grandmother    Hypertension Maternal Grandfather    Breast cancer Neg Hx     Past Medical History:  Diagnosis Date   Diabetes mellitus without complication ([place])    GERD (gastroesophageal reflux disease)    Hypertension     Past Surgical History:  Procedure Laterality Date   btl     LEFT HEART CATH AND CORONARY ANGIOGRAPHY N/A 09/12/2017   Procedure: LEFT HEART CATH AND CORONARY ANGIOGRAPHY;  Surgeon: Dixie Dials, MD;  Location: [facility];  Service: Cardiovascular;  Laterality: N/A;   LIPOMA EXCISION N/A 10/04/2019   Procedure: EXCISION OF FOREHEAD LIPOMA;  Surgeon: Cindra Presume, MD;  Location: [facility];  Service: Plastics;  Laterality: N/A;  45 min, please   TUBAL LIGATION       Current Outpatient Medications on File Prior to Visit  Medication Sig Dispense Refill   acetaminophen (TYLENOL) 500 MG tablet Take 500 mg by mouth every 6 (six) hours as needed.     amLODipine (NORVASC) 5 MG tablet Take 1 tablet (5 mg total) by mouth daily. 90 tablet 2   Blood Glucose Monitoring Suppl (FREESTYLE  FREEDOM LITE) w/Device KIT Use as directed to check blood sugars 2 times per day dx: e11.22 1 kit 1   cetirizine (ZYRTEC) 10 MG tablet Take 1 tablet (10 mg total) by mouth daily. 90 tablet 2   diclofenac Sodium (VOLTAREN) 1 % GEL Apply 2 g topically 4 (four) times daily. 100 g 2   fluticasone (FLONASE) 50 MCG/ACT nasal spray Place 2 sprays into both nostrils daily. 16 g 2  glucose blood (FREESTYLE LITE) test strip Use as instructed to check blood sugars 2 times per day dx: e11.22 150 each 2   Iron-FA-B Cmp-C-Biot-Probiotic (FUSION PLUS) CAPS One capsule po qd 90 capsule 3   levonorgestrel (MIRENA, 52 MG,) 20 MCG/DAY IUD Mirena 20 mcg/24 hours (7 yrs) 52 mg intrauterine device  Take 1 device by intrauterine route.     metFORMIN (GLUCOPHAGE-XR) 500 MG 24 hr tablet Take 2 tablets (1,000 mg total) by mouth daily with supper. 90 tablet 2   metoprolol succinate (TOPROL XL) 50 MG 24 hr tablet Take 1 tablet (50 mg total) by mouth daily. 90 tablet 2   Microlet Lancets MISC Use as directed to check blood sugars 2 times per day dx: e11.22 150 each 2   omeprazole (PRILOSEC) 40 MG capsule Take 1 capsule (40 mg total) by mouth daily. 90 capsule 2   telmisartan-hydrochlorothiazide (MICARDIS HCT) 80-25 MG tablet Take 1 tablet by mouth daily. 90 tablet 2   No current facility-administered medications on file prior to visit.    No Known Allergies  Physical exam:  Today's Vitals   03/17/22 1526  BP: (!) 142/94  Pulse: 86  Weight: 234 lb 8 oz (106.4 kg)  Height: '5\' 4"'  (1.626 m)   Body mass index is 40.25 kg/m.   Wt Readings from Last 3 Encounters:  03/17/22 234 lb 8 oz (106.4 kg)  12/08/21 229 lb 6.4 oz (104.1 kg)  11/24/21 231 lb 6.4 oz (105 kg)     Ht Readings from Last 3 Encounters:  03/17/22 '5\' 4"'  (1.626 m)  02/10/22 '5\' 4"'  (1.626 m)  12/08/21 '5\' 4"'  (1.626 m)      General: The patient is awake, alert and appears not in acute distress. The patient is well groomed. Head: Normocephalic,  atraumatic. Neck is supple. Mallampati 3,  neck circumference:17.5 inches .  Nasal airflow restricted,  patent.  Retrognathia is not seen.  Dental status: biological  Cardiovascular:  Regular rate and cardiac rhythm by pulse,  without distended neck veins. Respiratory: Lungs are clear to auscultation.  Skin:  Without evidence of ankle edema, or rash. Trunk: The patient's posture is erect.   Neurologic exam : The patient is awake and alert, oriented to place and time.   Memory subjective described as intact.  Attention span & concentration ability appears normal.  Speech is fluent,  without  dysarthria, dysphonia or aphasia.  Mood and affect are appropriate.   Cranial nerves: no loss of smell or taste reported  Pupils are equal and briskly reactive to light. Funduscopic exam deferred. .  Extraocular movements in vertical and horizontal planes were intact and without nystagmus.  No Diplopia. Visual fields by finger perimetry are intact. Hearing was intact to soft voice and finger rubbing.    Facial sensation intact to fine touch.  Facial motor strength is symmetric and tongue and uvula move midline.  Neck ROM : rotation, tilt and flexion extension were normal for age and shoulder shrug was symmetrical.    Motor exam:  Symmetric bulk, tone and ROM.   Normal tone without cog- wheeling, symmetric grip strength .   Sensory:  Fine touch, pinprick : normal.   Coordination: Rapid alternating movements in the fingers/hands were of normal speed.  The Finger-to-nose maneuver was deferred    Gait and station: Patient could rise unassisted from a seated position, walked without assistive device.  Stance is of normal width/ base.  Toe and heel walk were deferred.  Deep tendon reflexes:  in the  upper and lower extremities are symmetric and intact.  Babinski response was deferred.       After spending a total time of  35  minutes face to face and additional time for physical and neurologic  examination, review of laboratory studies,  personal review of imaging studies, reports and results of other testing and review of referral information / records as far as provided in visit, I have established the following assessments:  1) I have the pleasure of meeting Gabrielle Perkins today for the first time who is an overall very healthy woman of 46 years of age.  She has no specific neurologic concerns but she has concerns in regards to her sleep.  Her husband has witnessed apneic events and she has noticed that she sometimes snores or chokes herself awake.  She does have risk factors including a larger than average neck size, high-grade Mallampati and BMI.    She is treated for hypertension and for diabetes mellitus type 2 by her primary care physician.  She does not have any related neuropathy, vertigo lightheadedness or other concerns. She reports higher degree of daytime sleepiness and fatigue.   2)  Complicating may be her history of anemia which is related to his menorrhagia.  She has been given vitamin B supplements, she also has iron deficiency.   3) And has a history of GERD and hypertension.    My Plan is to proceed with:  1) [employer] patient screening for sleep apnea by PSG or HST.  Due to the very common occurrence of sleep choking, and apnea and need for 3 pillow prop up, I would like to order a SPLIT night , split at AHI 20/h. .   I would like to thank Glendale Chard, MD and Glendale Chard, [address] [place],  [place] [address] for allowing me to meet with and to take care of this pleasant patient.   In short, Gabrielle Perkins is presenting with witnessed apneas, snoring and EDS.   I plan to follow up either personally or through our NP within 2-3 months.    CC: I will share my notes with PCP. Marland Kitchen  Electronically signed by: Larey Seat, MD 03/17/2022 3:38 PM  [facility] Sleep Board certified by The [employer] of Sleep  Medicine and Diplomate of the [employer] of Sleep Medicine. Board certified In Neurology through the [place], Fellow of the [employer] of Neurology. Medical Director of [employer].

## 2022-03-17 NOTE — Patient Instructions (Signed)
Sleep Apnea Sleep apnea is a condition in which breathing pauses or becomes shallow during sleep. People with sleep apnea usually snore loudly. They may have times when they gasp and stop breathing for 10 seconds or more during sleep. This may happen many times during the night. Sleep apnea disrupts your sleep and keeps your body from getting the rest that it needs. This condition can increase your risk of certain health problems, including: Heart attack. Stroke. Obesity. Type 2 diabetes. Heart failure. Irregular heartbeat. High blood pressure. The goal of treatment is to help you breathe normally again. What are the causes?  The most common cause of sleep apnea is a collapsed or blocked airway. There are three kinds of sleep apnea: Obstructive sleep apnea. This kind is caused by a blocked or collapsed airway. Central sleep apnea. This kind happens when the part of the brain that controls breathing does not send the correct signals to the muscles that control breathing. Mixed sleep apnea. This is a combination of obstructive and central sleep apnea. What increases the risk? You are more likely to develop this condition if you: Are overweight. Smoke. Have a smaller than normal airway. Are older. Are female. Drink alcohol. Take sedatives or tranquilizers. Have a family history of sleep apnea. Have a tongue or tonsils that are larger than normal. What are the signs or symptoms? Symptoms of this condition include: Trouble staying asleep. Loud snoring. Morning headaches. Waking up gasping. Dry mouth or sore throat in the morning. Daytime sleepiness and tiredness. If you have daytime fatigue because of sleep apnea, you may be more likely to have: Trouble concentrating. Forgetfulness. Irritability or mood swings. Personality changes. Feelings of depression. Sexual dysfunction. This may include loss of interest if you are female, or erectile dysfunction if you are female. How is this  diagnosed? This condition may be diagnosed with: A medical history. A physical exam. A series of tests that are done while you are sleeping (sleep study). These tests are usually done in a sleep lab, but they may also be done at home. How is this treated? Treatment for this condition aims to restore normal breathing and to ease symptoms during sleep. It may involve managing health issues that can affect breathing, such as high blood pressure or obesity. Treatment may include: Sleeping on your side. Using a decongestant if you have nasal congestion. Avoiding the use of depressants, including alcohol, sedatives, and narcotics. Losing weight if you are overweight. Making changes to your diet. Quitting smoking. Using a device to open your airway while you sleep, such as: An oral appliance. This is a custom-made mouthpiece that shifts your lower jaw forward. A continuous positive airway pressure (CPAP) device. This device blows air through a mask when you breathe out (exhale). A nasal expiratory positive airway pressure (EPAP) device. This device has valves that you put into each nostril. A bi-level positive airway pressure (BIPAP) device. This device blows air through a mask when you breathe in (inhale) and breathe out (exhale). Having surgery if other treatments do not work. During surgery, excess tissue is removed to create a wider airway. Follow these instructions at home: Lifestyle Make any lifestyle changes that your health care provider recommends. Eat a healthy, well-balanced diet. Take steps to lose weight if you are overweight. Avoid using depressants, including alcohol, sedatives, and narcotics. Do not use any products that contain nicotine or tobacco. These products include cigarettes, chewing tobacco, and vaping devices, such as e-cigarettes. If you need help quitting, ask   your health care provider. General instructions Take over-the-counter and prescription medicines only as told  by your health care provider. If you were given a device to open your airway while you sleep, use it only as told by your health care provider. If you are having surgery, make sure to tell your health care provider you have sleep apnea. You may need to bring your device with you. Keep all follow-up visits. This is important. Contact a health care provider if: The device that you received to open your airway during sleep is uncomfortable or does not seem to be working. Your symptoms do not improve. Your symptoms get worse. Get help right away if: You develop: Chest pain. Shortness of breath. Discomfort in your back, arms, or stomach. You have: Trouble speaking. Weakness on one side of your body. Drooping in your face. These symptoms may represent a serious problem that is an emergency. Do not wait to see if the symptoms will go away. Get medical help right away. Call your local emergency services (911 in the [place] Do not drive yourself to the hospital. Summary Sleep apnea is a condition in which breathing pauses or becomes shallow during sleep. The most common cause is a collapsed or blocked airway. The goal of treatment is to restore normal breathing and to ease symptoms during sleep. This information is not intended to replace advice given to you by your health care provider. Make sure you discuss any questions you have with your health care provider. Document Revised: 04/15/2021 Document Reviewed: 08/15/2020 Elsevier Patient Education  [place] for Sleep Apnea  Sleep apnea is a condition in which breathing pauses or becomes shallow during sleep. Sleep apnea screening is a test to determine if you are at risk for sleep apnea. The test includes a series of questions. It will only takes a few minutes. Your health care provider may ask you to have this test in preparation for surgery or as part of a physical exam. What are the symptoms of sleep apnea? Common symptoms  of sleep apnea include: Snoring. Waking up often at night. Daytime sleepiness. Pauses in breathing. Choking or gasping during sleep. Irritability. Forgetfulness. Trouble thinking clearly. Depression. Personality changes. Most people with sleep apnea do not know that they have it. What are the advantages of sleep apnea screening? Getting screened for sleep apnea can help: Ensure your safety. It is important for your health care providers to know whether or not you have sleep apnea, especially if you are having surgery or have other long-term (chronic) health conditions. Improve your health and allow you to get a better night's rest. Restful sleep can help you: Have more energy. Lose weight. Improve high blood pressure. Improve diabetes management. Prevent stroke. Prevent car accidents. What happens during the screening? Screening usually includes being asked a list of questions about your sleep quality. Some questions you may be asked include: Do you snore? Is your sleep restless? Do you have daytime sleepiness? Has a partner or spouse told you that you stop breathing during sleep? Have you had trouble concentrating or memory loss? What is your age? What is your neck circumference? To measure your neck, keep your back straight and gently wrap the tape measure around your neck. Put the tape measure at the middle of your neck, between your chin and collarbone. What is your sex assigned at birth? Do you have or are you being treated for high blood pressure? If your screening test is positive, you are at  risk for the condition. Further testing may be needed to confirm a diagnosis of sleep apnea. Where to find more information You can find screening tools online or at your health care clinic. For more information about sleep apnea screening and healthy sleep, visit these websites: Centers for Disease Control and Prevention: http://www.wolf.info/ American Sleep Apnea Association:  www.sleepapnea.org Contact a health care provider if: You think that you may have sleep apnea. Summary Sleep apnea screening can help determine if you are at risk for sleep apnea. It is important for your health care providers to know whether or not you have sleep apnea, especially if you are having surgery or have other chronic health conditions. You may be asked to take a screening test for sleep apnea in preparation for surgery or as part of a physical exam. This information is not intended to replace advice given to you by your health care provider. Make sure you discuss any questions you have with your health care provider. Document Revised: 08/15/2020 Document Reviewed: 08/15/2020 Elsevier Patient Education  [place] Sleep Information, Adult Quality sleep is important for your mental and physical health. It also improves your quality of life. Quality sleep means you: Are asleep for most of the time you are in bed. Fall asleep within 30 minutes. Wake up no more than once a night.  Are awake for no longer than 20 minutes if you do wake up during the night. Most adults need 7-8 hours of quality sleep each night. How can poor sleep affect me? If you do not get enough quality sleep, you may have: Mood swings. Daytime sleepiness. Confusion. Decreased reaction time. Sleep disorders, such as insomnia and sleep apnea. Difficulty with: Solving problems. Coping with stress. Paying attention. These issues may affect your performance and productivity at work, school, and at home. Lack of sleep may also put you at higher risk for accidents, suicide, and risky behaviors. If you do not get quality sleep you may also be at higher risk for several health problems, including: Infections. Type 2 diabetes. Heart disease. High blood pressure. Obesity. Worsening of long-term conditions, like arthritis, kidney disease, depression, Parkinson's disease, and epilepsy. What actions  can I take to get more quality sleep?     Stick to a sleep schedule. Go to sleep and wake up at about the same time each day. Do not try to sleep less on weekdays and make up for lost sleep on weekends. This does not work. Try to get about 30 minutes of exercise on most days. Do not exercise 2-3 hours before going to bed. Limit naps during the day to 30 minutes or less. Do not use any products that contain nicotine or tobacco, such as cigarettes or e-cigarettes. If you need help quitting, ask your health care provider. Do not drink caffeinated beverages for at least 8 hours before going to bed. Coffee, tea, and some sodas contain caffeine. Do not drink alcohol close to bedtime. Do not eat large meals close to bedtime. Do not take naps in the late afternoon. Try to get at least 30 minutes of sunlight every day. Morning sunlight is best. Make time to relax before bed. Reading, listening to music, or taking a hot bath promotes quality sleep. Make your bedroom a place that promotes quality sleep. Keep your bedroom dark, quiet, and at a comfortable room temperature. Make sure your bed is comfortable. Take out sleep distractions like TV, a computer, smartphone, and bright lights. If you are lying awake  in bed for longer than 20 minutes, get up and do a relaxing activity until you feel sleepy. Work with your health care provider to treat medical conditions that may affect sleeping, such as: Nasal obstruction. Snoring. Sleep apnea and other sleep disorders. Talk to your health care provider if you think any of your prescription medicines may cause you to have difficulty falling or staying asleep. If you have sleep problems, talk with a sleep consultant. If you think you have a sleep disorder, talk with your health care provider about getting evaluated by a specialist. Where to find more information [place] website: https://sleepfoundation.org National Heart, Lung, and [place] ([place]): http://www.saunders.info/.pdf Centers for Disease Control and Prevention (CDC): LearningDermatology.pl Contact a health care provider if you: Have trouble getting to sleep or staying asleep. Often wake up very early in the morning and cannot get back to sleep. Have daytime sleepiness. Have daytime sleep attacks of suddenly falling asleep and sudden muscle weakness (narcolepsy). Have a tingling sensation in your legs with a strong urge to move your legs (restless legs syndrome). Stop breathing briefly during sleep (sleep apnea). Think you have a sleep disorder or are taking a medicine that is affecting your quality of sleep. Summary Most adults need 7-8 hours of quality sleep each night. Getting enough quality sleep is an important part of health and well-being. Make your bedroom a place that promotes quality sleep and avoid things that may cause you to have poor sleep, such as alcohol, caffeine, smoking, and large meals. Talk to your health care provider if you have trouble falling asleep or staying asleep. This information is not intended to replace advice given to you by your health care provider. Make sure you discuss any questions you have with your health care provider. Document Revised: 10/12/2021 Document Reviewed: 07/06/2021 Elsevier Patient Education  [place].

## 2022-03-18 ENCOUNTER — Telehealth: Payer: Self-pay | Admitting: Neurology

## 2022-03-18 NOTE — Telephone Encounter (Signed)
tricare pending faxed notes

## 2022-03-26 ENCOUNTER — Encounter: Payer: Self-pay | Admitting: Podiatry

## 2022-03-26 ENCOUNTER — Ambulatory Visit (INDEPENDENT_AMBULATORY_CARE_PROVIDER_SITE_OTHER)

## 2022-03-26 ENCOUNTER — Ambulatory Visit (INDEPENDENT_AMBULATORY_CARE_PROVIDER_SITE_OTHER): Admitting: Podiatry

## 2022-03-26 DIAGNOSIS — M779 Enthesopathy, unspecified: Secondary | ICD-10-CM | POA: Diagnosis not present

## 2022-03-26 DIAGNOSIS — M7751 Other enthesopathy of right foot: Secondary | ICD-10-CM

## 2022-03-26 MED ORDER — DICLOFENAC SODIUM 75 MG PO TBEC: 75.0000 mg | DELAYED_RELEASE_TABLET | Freq: Two times a day (BID) | ORAL | 2 refills | Status: DC

## 2022-03-26 MED ORDER — TRIAMCINOLONE ACETONIDE 10 MG/ML IJ SUSP: 10.0000 mg | Freq: Once | INTRAMUSCULAR | Status: AC

## 2022-03-27 NOTE — Progress Notes (Signed)
Subjective:   Patient ID: Gabrielle Perkins, female   DOB: 46 y.o.   MRN: [id number]   HPI Patient states she developed a lot of pain in her right ankle over the last couple months and its been hard for her to walk.  States that its been ongoing.  Patient does not smoke likes to be active   Review of Systems  All other systems reviewed and are negative.       Objective:  Physical Exam Vitals and nursing note reviewed.  Constitutional:      Appearance: She is well-developed.  Pulmonary:     Effort: Pulmonary effort is normal.  Musculoskeletal:        General: Normal range of motion.  Skin:    General: Skin is warm.  Neurological:     Mental Status: She is alert.     Neurovascular status intact muscle strength found to be adequate range of motion adequate with exquisite discomfort mostly in the sinus tarsi right and into the lateral ankle gutter with no range of motion loss no crepitus within the joint surface upon movement.  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  Reappoint for [place] to recheck again answered all questions today  X-rays were negative for what appears to be any form of advanced arthritis of the subtalar joint right foot with only mild collapse of the medial longitudinal arch

## 2022-03-29 NOTE — Telephone Encounter (Signed)
LVM for pt to call back to schedule  tricare auth: [id number] (exp. 03/19/22 to 09/15/22)

## 2022-03-31 ENCOUNTER — Telehealth: Payer: Self-pay | Admitting: *Deleted

## 2022-03-31 MED ORDER — DICLOFENAC SODIUM 75 MG PO TBEC: 75.0000 mg | DELAYED_RELEASE_TABLET | Freq: Two times a day (BID) | ORAL | 2 refills | Status: DC

## 2022-03-31 NOTE — Telephone Encounter (Signed)
Thank you :)

## 2022-03-31 NOTE — Telephone Encounter (Signed)
Patient is calling to ask that the prescription(diclofenac) be resent to correct pharmacy. Resending to the [facility] [place] [place]. Orginally sent to [facility].

## 2022-04-05 NOTE — Telephone Encounter (Signed)
LVM for pt to call me back to schedule sleep study  

## 2022-04-07 ENCOUNTER — Encounter: Payer: Self-pay | Admitting: Internal Medicine

## 2022-04-09 ENCOUNTER — Ambulatory Visit: Admitting: Podiatry

## 2022-04-13 ENCOUNTER — Other Ambulatory Visit: Payer: Self-pay

## 2022-04-13 DIAGNOSIS — D500 Iron deficiency anemia secondary to blood loss (chronic): Secondary | ICD-10-CM

## 2022-04-14 ENCOUNTER — Inpatient Hospital Stay: Admitting: Hematology

## 2022-04-14 ENCOUNTER — Inpatient Hospital Stay

## 2022-04-15 ENCOUNTER — Telehealth: Payer: Self-pay | Admitting: Hematology

## 2022-04-15 NOTE — Telephone Encounter (Signed)
.  Called patient to schedule appointment per 7/26 inbasket, patient is aware of date and time.   

## 2022-04-19 ENCOUNTER — Telehealth: Payer: Self-pay | Admitting: Hematology

## 2022-04-19 NOTE — Telephone Encounter (Signed)
Rescheduled upcoming appointment due to provider's schedule. Patient is aware of changes. 

## 2022-04-23 ENCOUNTER — Ambulatory Visit: Admitting: Podiatry

## 2022-04-23 ENCOUNTER — Other Ambulatory Visit

## 2022-05-03 ENCOUNTER — Ambulatory Visit: Admitting: Hematology

## 2022-05-04 ENCOUNTER — Ambulatory Visit (HOSPITAL_BASED_OUTPATIENT_CLINIC_OR_DEPARTMENT_OTHER): Admitting: Cardiovascular Disease

## 2022-05-07 ENCOUNTER — Ambulatory Visit: Admitting: Podiatry

## 2022-05-13 ENCOUNTER — Ambulatory Visit (INDEPENDENT_AMBULATORY_CARE_PROVIDER_SITE_OTHER): Admitting: Podiatry

## 2022-05-13 DIAGNOSIS — M7751 Other enthesopathy of right foot: Secondary | ICD-10-CM

## 2022-05-20 NOTE — Progress Notes (Signed)
Subjective:   Patient ID: Gabrielle Perkins, female   DOB: 46 y.o.   MRN: [id number]   Foot Pain   Patient presents with follow-up of right ankle pain.  She was seen by Dr. Paulla Dolly.  Dr. Paulla Dolly gave her an injection which seemed to help considerably.  She no longer has any pain.  She wanted to get it evaluated make sure that the she does not need any more injections   Review of Systems  All other systems reviewed and are negative.       Objective:  Physical Exam Vitals and nursing note reviewed.  Constitutional:      Appearance: She is well-developed.  Pulmonary:     Effort: Pulmonary effort is normal.  Musculoskeletal:        General: Normal range of motion.  Skin:    General: Skin is warm.  Neurological:     Mental Status: She is alert.     Neurovascular status intact muscle strength found to be adequate range of motion adequate with exquisite discomfort mostly in the sinus tarsi right and into the lateral ankle gutter with no range of motion loss no crepitus within the joint surface upon movement.  Good digital perfusion well oriented x3     Assessment:  Subtalar joint arthritis/capsulitis right foot     Plan:  Clinic her pain has completely resolved.  No further pain is noted I discussed shoe gear modification.  If her pain resolves she can follow-up with me or Dr. Paulla Dolly for further injections.  Patient is officially discharged from my care  X-rays were negative for what appears to be any form of advanced arthritis of the subtalar joint right foot with only mild collapse of the medial longitudinal arch

## 2022-06-03 NOTE — Telephone Encounter (Signed)
Patient called back to schedule her sleep study.  Split- [employer] Josem Kaufmann: [id number] (exp. 03/19/22 to 09/15/22)   Patient is scheduled at [facility] for 07/01/22 at 9 pm.  Mailed packet to the patient.

## 2022-06-09 ENCOUNTER — Other Ambulatory Visit: Payer: Self-pay

## 2022-06-09 ENCOUNTER — Inpatient Hospital Stay: Attending: Hematology

## 2022-06-09 ENCOUNTER — Inpatient Hospital Stay: Admitting: Hematology

## 2022-06-09 VITALS — BP 163/84 | HR 83 | Temp 97.7°F | Resp 18 | Ht 64.0 in | Wt 238.6 lb

## 2022-06-09 DIAGNOSIS — Z79899 Other long term (current) drug therapy: Secondary | ICD-10-CM | POA: Diagnosis not present

## 2022-06-09 DIAGNOSIS — D500 Iron deficiency anemia secondary to blood loss (chronic): Secondary | ICD-10-CM | POA: Insufficient documentation

## 2022-06-09 DIAGNOSIS — N920 Excessive and frequent menstruation with regular cycle: Secondary | ICD-10-CM | POA: Insufficient documentation

## 2022-06-09 LAB — CMP (CANCER CENTER ONLY)
ALT: 32 U/L (ref 0–44)
AST: 22 U/L (ref 15–41)
Albumin: 4 g/dL (ref 3.5–5.0)
Alkaline Phosphatase: 102 U/L (ref 38–126)
Anion gap: 8 (ref 5–15)
BUN: 10 mg/dL (ref 6–20)
CO2: 26 mmol/L (ref 22–32)
Calcium: 9.1 mg/dL (ref 8.9–10.3)
Chloride: 102 mmol/L (ref 98–111)
Creatinine: 0.66 mg/dL (ref 0.44–1.00)
GFR, Estimated: >60 mL/min (ref >60)
Glucose, Bld: 205 mg/dL — ABNORMAL HIGH (ref 70–99)
Potassium: 3.5 mmol/L (ref 3.5–5.1)
Sodium: 136 mmol/L (ref 135–145)
Total Bilirubin: 0.4 mg/dL (ref 0.3–1.2)
Total Protein: 7.3 g/dL (ref 6.5–8.1)

## 2022-06-09 LAB — CBC WITH DIFFERENTIAL (CANCER CENTER ONLY)
Abs Immature Granulocytes: 0.01 10*3/uL (ref 0.00–0.07)
Basophils Absolute: 0 10*3/uL (ref 0.0–0.1)
Basophils Relative: 1 %
Eosinophils Absolute: 0.1 10*3/uL (ref 0.0–0.5)
Eosinophils Relative: 2 %
HCT: 37.6 % (ref 36.0–46.0)
Hemoglobin: 12.7 g/dL (ref 12.0–15.0)
Immature Granulocytes: 0 %
Lymphocytes Relative: 40 %
Lymphs Abs: 2 10*3/uL (ref 0.7–4.0)
MCH: 30 pg (ref 26.0–34.0)
MCHC: 33.8 g/dL (ref 30.0–36.0)
MCV: 88.7 fL (ref 80.0–100.0)
Monocytes Absolute: 0.4 10*3/uL (ref 0.1–1.0)
Monocytes Relative: 8 %
Neutro Abs: 2.5 10*3/uL (ref 1.7–7.7)
Neutrophils Relative %: 49 %
Platelet Count: 402 10*3/uL — ABNORMAL HIGH (ref 150–400)
RBC: 4.24 MIL/uL (ref 3.87–5.11)
RDW: 13.3 % (ref 11.5–15.5)
WBC Count: 4.9 10*3/uL (ref 4.0–10.5)
nRBC: 0 % (ref 0.0–0.2)

## 2022-06-09 LAB — IRON AND IRON BINDING CAPACITY (CC-WL,HP ONLY)
Iron: 37 ug/dL (ref 28–170)
Saturation Ratios: 9 % — ABNORMAL LOW (ref 10.4–31.8)
TIBC: 431 ug/dL (ref 250–450)
UIBC: 394 ug/dL (ref 148–442)

## 2022-06-09 LAB — FERRITIN: Ferritin: 16 ng/mL (ref 11–307)

## 2022-06-18 ENCOUNTER — Encounter: Payer: Self-pay | Admitting: Hematology

## 2022-06-18 NOTE — Progress Notes (Signed)
This encounter was created in error - please disregard.

## 2022-06-25 ENCOUNTER — Telehealth: Payer: Self-pay

## 2022-06-25 NOTE — Telephone Encounter (Signed)
Call to pt reference next night PREP class Starting on 07/06/22 T/TH 6p-715p at [place] in participating and can do schedule and location  1:1 interview scheduled for 10/9 at 530pm Will meet her at the front desk of [facility]

## 2022-06-29 ENCOUNTER — Telehealth: Payer: Self-pay

## 2022-06-29 ENCOUNTER — Other Ambulatory Visit: Payer: Self-pay | Admitting: Internal Medicine

## 2022-06-29 DIAGNOSIS — Z1231 Encounter for screening mammogram for malignant neoplasm of breast: Secondary | ICD-10-CM

## 2022-06-29 NOTE — Progress Notes (Signed)
[facility] PREP Evaluation  Patient Details  Name: Gabrielle Perkins MRN: [id number] Date of Birth: 1976/01/11 Age: 46 y.o. PCP: Glendale Chard, MD  Vitals:   06/28/22 1730  BP: 136/82  Pulse: 76  SpO2: 97%  Weight: 240 lb 6.4 oz (109 kg)     [facility] Eval - 06/29/22 1800       Referral    Referring Provider [facility]    Reason for referral Inactivity;Hypertension;Diabetes;High Cholesterol    Program Start Date 07/06/22   T/TH 6pm-715p x 12wks     Measurement   Waist Circumference 47 inches    Hip Circumference 51 inches    Body fat 46.2 percent      Information for Trainer   Goals Weigh less than 200 lbs, feel better, sleep better, get healthy    Current Exercise none    Orthopedic Concerns right ankle and right knee pain    Pertinent Medical History HTN, DM2, high chol    Current Barriers none    Restrictions/Precautions --   none   Medications that affect exercise Medication causing dizziness/drowsiness      Timed Up and Go (TUGS)   Timed Up and Go Low risk <9 seconds      Mobility and Daily Activities   I find it easy to walk up or down two or more flights of stairs. 2    I have no trouble taking out the trash. 4    I do housework such as vacuuming and dusting on my own without difficulty. 4    I can easily lift a gallon of milk (8lbs). 4    I can easily walk a mile. 4    I have no trouble reaching into high cupboards or reaching down to pick up something from the floor. 4    I do not have trouble doing out-door work such as Armed forces logistics/support/administrative officer, raking leaves, or gardening. 4      Mobility and Daily Activities   I feel younger than my age. 1    I feel independent. 4    I feel energetic. 2    I live an active life.  4    I feel strong. 4    I feel healthy. 2    I feel active as other people my age. 1      How fit and strong are you.   Fit and Strong Total Score 44            Past Medical History:  Diagnosis Date   Diabetes mellitus without complication ([place])     GERD (gastroesophageal reflux disease)    Hypertension    Past Surgical History:  Procedure Laterality Date   btl     LEFT HEART CATH AND CORONARY ANGIOGRAPHY N/A 09/12/2017   Procedure: LEFT HEART CATH AND CORONARY ANGIOGRAPHY;  Surgeon: Dixie Dials, MD;  Location: [facility];  Service: Cardiovascular;  Laterality: N/A;   LIPOMA EXCISION N/A 10/04/2019   Procedure: EXCISION OF FOREHEAD LIPOMA;  Surgeon: Cindra Presume, MD;  Location: [facility];  Service: Plastics;  Laterality: N/A;  45 min, please   TUBAL LIGATION     Social History   Tobacco Use  Smoking Status Never  Smokeless Tobacco Never    Barnett Hatter 06/29/2022, 6:33 PM

## 2022-07-01 ENCOUNTER — Ambulatory Visit (INDEPENDENT_AMBULATORY_CARE_PROVIDER_SITE_OTHER): Admitting: Neurology

## 2022-07-01 DIAGNOSIS — G4710 Hypersomnia, unspecified: Secondary | ICD-10-CM | POA: Diagnosis not present

## 2022-07-01 DIAGNOSIS — G4719 Other hypersomnia: Secondary | ICD-10-CM

## 2022-07-01 DIAGNOSIS — R519 Headache, unspecified: Secondary | ICD-10-CM

## 2022-07-01 DIAGNOSIS — R0681 Apnea, not elsewhere classified: Secondary | ICD-10-CM

## 2022-07-01 DIAGNOSIS — F519 Sleep disorder not due to a substance or known physiological condition, unspecified: Secondary | ICD-10-CM

## 2022-07-01 DIAGNOSIS — R0601 Orthopnea: Secondary | ICD-10-CM

## 2022-07-01 DIAGNOSIS — D500 Iron deficiency anemia secondary to blood loss (chronic): Secondary | ICD-10-CM

## 2022-07-02 ENCOUNTER — Ambulatory Visit
Admission: RE | Admit: 2022-07-02 | Discharge: 2022-07-02 | Disposition: A | Discharge status: Home / Self Care | Source: Ambulatory Visit | Attending: Internal Medicine | Admitting: Internal Medicine

## 2022-07-02 DIAGNOSIS — Z1231 Encounter for screening mammogram for malignant neoplasm of breast: Secondary | ICD-10-CM

## 2022-07-12 NOTE — Addendum Note (Signed)
Addended by: Larey Seat on: 07/12/2022 06:02 PM   Modules accepted: Orders

## 2022-07-12 NOTE — Procedures (Signed)
[facility] POLYSOMNOGRAPHY  INTERPRETATION REPORT   STUDY DATE:  07/01/2022     PATIENT NAME:  Gabrielle Perkins         DATE OF BIRTH:  November 30, 1975  PATIENT ID:  [id number]    TYPE OF STUDY:  SPLIT  READING PHYSICIAN: Larey Seat, MD REFERRED BY: Dr Baird Cancer, MD  SCORING TECHNICIAN: Gaylyn Cheers, RPSGT   HISTORY:  Mrs. Della Scrivener. Bloodgood was seen on 03-17-2022 in consultation for snoring , witnessed apneas, morbid obesity and sleep choking. 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  ADDITIONAL INFORMATION:  The Epworth Sleepiness Scale was endorsed at 14 /24 points (scores above or equal to 10 are suggestive of hypersomnolence). FSS was endorsed at 37 /63 points.  Height: 64 in Weight: 234 lbs (BMI 40) Neck Size: 18 " MEDICATIONS: Tylenol, Norvasc, Zyrtec, Voltaren, Flonase, Fusion plus, Mirena, Glucophage, Toprol-XL, Prilosec, Micardis HCT TECHNICAL DESCRIPTION: A registered sleep technologist (RPSGT)  was in attendance for the duration of the recording.  Data collection, scoring, video monitoring, and reporting were performed in compliance with the AASM Manual for the Scoring of Sleep and Associated Events; (Hypopnea is scored based on the criteria listed in Section VIII D. 1b in the AASM Manual V2.6 using a 4% oxygen desaturation rule or Hypopnea is scored based on the criteria listed in Section VIII D. 1a in the AASM Manual V2.6 using 3% oxygen desaturation and /or arousal rule).   SLEEP CONTINUITY AND SLEEP ARCHITECTURE:  Lights-out was at 22:08: and lights-on at  05:05:, with  7.0  hours of recording time . Total sleep time (TST) was 337.5 minutes with a decreased sleep efficiency at 80.9%.  Sleep latency was normal at 26.5 minutes.   REM sleep latency was increased at 137.5 minutes.  Of the total sleep time, the percentage of stage N1 sleep was 13.5%, stage N2 sleep was 69%, stage N3 sleep was 2.1%, and REM  sleep was 15.3%. Wake after sleep onset (WASO) time accounted for 53 minutes.    There were 4 Stage R periods observed on this study night, 27 awakenings (i.e. transitions to Stage W from any sleep stage), and 85 total stage transitions.  BODY POSITION:  TST was divided between the following sleep positions: Supine sleep for 86 minutes (26%), non-supine sleep for 251 minutes (74%); divided further into right-lateral sleep for 176 minutes (52%), left lateral for 75 minutes (22%), and prone sleep for 00 minutes (0%). Total supine REM sleep time was 04 minutes (9% of total REM sleep).   RESPIRATORY MONITORING:  Based on AASM criteria (using a 3% oxygen desaturation and /or arousal rule for scoring hypopneas), there were 37 apneas (29 obstructive; 1 central; 7 mixed), and 66 hypopneas.  The Apnea index was 6.6/h. The Hypopnea index was 11.7/h. The overall AHI (apnea-hypopnea index) was 18/h.  Remarkably, the AHI was 16.6 /h in supine sleep, and AHI was 47/h in non-supine sleep; AHI was 48.9/h during REM, and AHI was 66.7/h in supine REM. NREM AHI was 12.8/h. There were 0 respiratory effort-related arousals (RERAs).   OXIMETRY: Oxyhemoglobin Saturation Nadir during sleep was at 79% from a mean of 93%.   Of the Total Sleep Time (TST), hypoxemia (<89%) was present for a total of 3.7 minutes, or 1.1% of total sleep time.  LIMB MOVEMENTS: There were 5 periodic limb movements of sleep (0.9/h), of which 0 were associated with an arousal. AROUSAL: There were 54 arousals in total,  for an arousal index of 7 arousals/hour.  Of these, 17 were identified as respiratory-related arousals (3 /h), none were PLM-related arousals (0 /h), and 37 were non-specific arousals (7 /h).  EEG:  PSG EEG was of normal amplitude and frequency, with symmetric manifestation of sleep stages. EKG: The electrocardiogram documented NSR with isolated PVCs.  The average heart rate during sleep was 77 bpm.  The heart rate during sleep varied  between a minimum of  65 bpm  and a maximum of  90 bpm. AUDIO and VIDEO: no complex movements in sleep were noted, no vocalization or phonation recorded.   IMPRESSION: Sleep disordered breathing was present. Obstructive Sleep apnea was seen at a mild to moderate degree, associated with non-supine sleep and REM sleep.   REM sleep dependent sleep apnea. REM sleep onset came too late to allow SPLIT night protocol to be initiated.  A very unusual distribution of apnea was seen, with an higher AHI in non-supine sleep than in supine sleep. This was explained by all REM sleep being seen in non-supine sleep.   RECOMMENDATIONS: REM sleep dependent apnea will respond best to positive airway pressure therapy and this should be initiated. Auto CPAP with a setting from 6 through 16 cm water, 2 cm EPR, heated humidification and a mask of patient's comfort and choice.  Main risk factor for REM sleep dependent apnea is Obesity. The patient works with Dr Baird Cancer on a medical guided weight loss program.     Larey Seat, MD Boarded by the [facility], ABPN, and member of the AASM since 1991. Medical Director of [employer].

## 2022-07-13 ENCOUNTER — Telehealth: Payer: Self-pay | Admitting: Neurology

## 2022-07-13 NOTE — Telephone Encounter (Signed)
I called Gabrielle Perkins. I advised Gabrielle Perkins that Dr. Brett Fairy reviewed their sleep study results and found that Gabrielle Perkins has OSA. Dr. Brett Fairy recommends that Gabrielle Perkins starts auto CPAP. I reviewed PAP compliance expectations with the Gabrielle Perkins. Gabrielle Perkins is agreeable to starting a CPAP. I advised Gabrielle Perkins that an order will be sent to a DME, [facility], and [facility] will call the Gabrielle Perkins within about one week after they file with the Gabrielle Perkins's insurance. [facility] will show the Gabrielle Perkins how to use the machine, fit for masks, and troubleshoot the CPAP if needed. A follow up appt was made for insurance purposes Frann Rider on 10/04/2022 at 10:15 am. Gabrielle Perkins verbalized understanding to arrive 15 minutes early and bring their CPAP. A letter with all of this information in it will be mailed to the Gabrielle Perkins as a reminder. I verified with the Gabrielle Perkins that the address we have on file is correct. Gabrielle Perkins verbalized understanding of results. Gabrielle Perkins had no questions at this time but was encouraged to call back if questions arise. I have sent the order to [facility] and have received confirmation that they have received the order.

## 2022-07-13 NOTE — Telephone Encounter (Signed)
-----   Message from Larey Seat, MD sent at 07/12/2022  6:02 PM EDT ----- IMPRESSION: 1. Sleep disordered breathing was present. Obstructive Sleep apnea was seen at a mild to moderate degree, associated with non-supine sleep and REM sleep.   2. REM sleep dependent sleep apnea. REM sleep onset came too late to allow SPLIT night protocol to be initiated.  3. A very unusual distribution of apnea was seen, with an higher AHI in non-supine sleep than in supine sleep. This was explained by all REM sleep being seen in non-supine sleep.   RECOMMENDATIONS: REM sleep dependent apnea will respond best to positive airway pressure therapy and this should be initiated. Auto CPAP with a setting from 6 through 16 cm water, 2 cm EPR, heated humidification and a mask of patient's comfort and choice.  Main risk factor for REM sleep dependent apnea is Obesity.

## 2022-07-16 NOTE — Progress Notes (Signed)
[facility] PREP Weekly Session  Patient Details  Name: Gabrielle Perkins MRN: [id number] Date of Birth: 01/11/1976 Age: 46 y.o. PCP: Glendale Chard, MD  Vitals:   07/13/22 1800  Weight: 239 lb 3.2 oz (108.5 kg)     [facility] Weekly seesion - 07/16/22 1400       [facility] "PREP" Location   [facility] "PREP" Location [facility]      Weekly Session   Topic Discussed Importance of resistance training;Other ways to be active    Minutes exercised this week 60 minutes    Classes attended to date [place] 07/16/2022, 2:18 PM

## 2022-07-21 NOTE — Progress Notes (Signed)
[facility] PREP Weekly Session  Patient Details  Name: Gabrielle Perkins MRN: [id number] Date of Birth: [birth year] Age: 46 y.o. PCP: Glendale Chard, MD  Vitals:   07/20/22 1800  Weight: 238 lb 9.6 oz (108.2 kg)     [facility] Weekly seesion - 07/21/22 1300       [facility] "PREP" Location   [facility] "PREP" Location [facility]      Weekly Session   Topic Discussed Healthy eating tips    Classes attended to date [place] 07/21/2022, 1:32 PM

## 2022-08-02 NOTE — Telephone Encounter (Signed)
Called [facility]. Confirmed they do not take pt [employer] insurance since this is primary.  I will send order to [place] instead. I called pt to let her know. She verbalized understanding. I provided her their phone# to call if they do not reach out to her in the next 1-2 wk.   Sent community message to [employer] that order placed.

## 2022-08-02 NOTE — Telephone Encounter (Signed)
Pt said [facility] is out of network with her insurance company. Would like a call from the nurse.

## 2022-08-04 NOTE — Progress Notes (Signed)
[facility] PREP Weekly Session  Patient Details  Name: Gabrielle Perkins MRN: [id number] Date of Birth: 02-Feb-1976 Age: 45 y.o. PCP: Glendale Chard, MD  Vitals:   07/27/22 1800  Weight: 241 lb (109.3 kg)     [facility] Weekly seesion - 08/04/22 1200       [facility] "PREP" Location   [facility] "PREP" [facility]      Weekly Session   Topic Discussed Health habits    Minutes exercised this week 60 minutes    Classes attended to date 5            Late entry Barnett Hatter 08/04/2022, 12:32 PM

## 2022-08-19 ENCOUNTER — Telehealth: Payer: Self-pay | Admitting: Neurology

## 2022-08-19 NOTE — Telephone Encounter (Signed)
Pt called wanting to know if she can be referred to another DME company that does not require a CC number to be on file to schedule an appt. Please call to advise.

## 2022-08-19 NOTE — Telephone Encounter (Addendum)
LVM relaying mychart message to pt. Advised I also sent her a mychart.

## 2022-08-19 NOTE — Progress Notes (Signed)
[facility] PREP Weekly Session  Patient Details  Name: Gabrielle Perkins MRN: [id number] Date of Birth: 08-Feb-1976 Age: 46 y.o. PCP: Glendale Chard, MD  Vitals:   08/17/22 1800  Weight: 239 lb (108.4 kg)     [facility] Weekly seesion - 08/19/22 1700       [facility] "PREP" Location   [facility] "PREP" Location [facility]      Weekly Session   Topic Discussed Expectations and non-scale victories    Classes attended to date [place] 08/19/2022, 5:19 PM

## 2022-09-08 NOTE — Progress Notes (Signed)
[facility] PREP Weekly Session  Patient Details  Name: Gabrielle Perkins MRN: [id number] Date of Birth: 06-25-1976 Age: 46 y.o. PCP: Glendale Chard, MD  Vitals:   09/07/22 1800  Weight: 234 lb 9.6 oz (106.4 kg)     [facility] Weekly seesion - 09/08/22 1600       [facility] "PREP" Location   [facility] "PREP" Location [facility]      Weekly Session   Topic Discussed Calorie breakdown    Minutes exercised this week 105 minutes    Classes attended to date [place] 09/08/2022, 4:02 PM

## 2022-09-15 NOTE — Progress Notes (Signed)
[facility] PREP Weekly Session  Patient Details  Name: Gabrielle Perkins MRN: [id number] Date of Birth: 1975/11/19 Age: 46 y.o. PCP: Glendale Chard, MD  There were no vitals filed for this visit.   [facility] Weekly seesion - 09/15/22 1500       [facility] "PREP" Location   [facility] "PREP" Location [facility]      Weekly Session   Topic Discussed Hitting roadblocks    Minutes exercised this week 75 minutes    Classes attended to date [place] 09/15/2022, 3:38 PM

## 2022-10-04 ENCOUNTER — Ambulatory Visit: Admitting: Adult Health

## 2022-10-21 NOTE — Progress Notes (Signed)
[facility] PREP Evaluation  Patient Details  Name: Gabrielle Perkins MRN: [id number] Date of Birth: 1976/05/05 Age: 47 y.o. PCP: Glendale Chard, MD  Vitals:   10/12/22 1830  BP: 136/82  Pulse: 88  SpO2: 97%  Weight: 234 lb 6.4 oz (106.3 kg)     [facility] Eval - 10/21/22 1600       [facility] "PREP" Location   [facility] "PREP" Location [facility]      Referral    Program Start Date --   Final date of PREP 10/12/22     Measurement   Waist Circumference 46 inches    Hip Circumference 50 inches    Body fat 45.9 percent      Information for Trainer   Goals reset      Mobility and Daily Activities   I find it easy to walk up or down two or more flights of stairs. 4    I have no trouble taking out the trash. 4    I do housework such as vacuuming and dusting on my own without difficulty. 4    I can easily lift a gallon of milk (8lbs). 4    I can easily walk a mile. 4    I have no trouble reaching into high cupboards or reaching down to pick up something from the floor. 4    I do not have trouble doing out-door work such as Armed forces logistics/support/administrative officer, raking leaves, or gardening. 3      Mobility and Daily Activities   I feel younger than my age. 3    I feel independent. 4    I feel energetic. 3    I live an active life.  3    I feel strong. 4    I feel healthy. 3    I feel active as other people my age. 3      How fit and strong are you.   Fit and Strong Total Score 50            Past Medical History:  Diagnosis Date   Diabetes mellitus without complication ([place])    GERD (gastroesophageal reflux disease)    Hypertension    Past Surgical History:  Procedure Laterality Date   btl     LEFT HEART CATH AND CORONARY ANGIOGRAPHY N/A 09/12/2017   Procedure: LEFT HEART CATH AND CORONARY ANGIOGRAPHY;  Surgeon: Dixie Dials, MD;  Location: [facility];  Service: Cardiovascular;  Laterality: N/A;   LIPOMA EXCISION N/A 10/04/2019   Procedure: EXCISION OF FOREHEAD LIPOMA;  Surgeon: Cindra Presume, MD;  Location: [facility];  Service: Plastics;  Laterality: N/A;  45 min, please   TUBAL LIGATION     Social History   Tobacco Use  Smoking Status Never  Smokeless Tobacco Never  Attended 11 sessions, 7 of 12 educational topics Fit testing: Cardio march: 244 to 265 Sit to standing: 10 to 14 Bicep curl: 17 to 23 Balance much improved. Encouraged to continue to exercise   Barnett Hatter 10/21/2022, 4:16 PM

## 2022-11-03 ENCOUNTER — Ambulatory Visit (INDEPENDENT_AMBULATORY_CARE_PROVIDER_SITE_OTHER): Admitting: Internal Medicine

## 2022-11-03 ENCOUNTER — Encounter: Payer: Self-pay | Admitting: Internal Medicine

## 2022-11-03 VITALS — BP 126/84 | HR 76 | Temp 97.9°F | Ht 64.0 in | Wt 231.0 lb

## 2022-11-03 DIAGNOSIS — E538 Deficiency of other specified B group vitamins: Secondary | ICD-10-CM

## 2022-11-03 DIAGNOSIS — Z0000 Encounter for general adult medical examination without abnormal findings: Secondary | ICD-10-CM | POA: Diagnosis not present

## 2022-11-03 DIAGNOSIS — Z6839 Body mass index (BMI) 39.0-39.9, adult: Secondary | ICD-10-CM

## 2022-11-03 DIAGNOSIS — I152 Hypertension secondary to endocrine disorders: Secondary | ICD-10-CM | POA: Diagnosis not present

## 2022-11-03 DIAGNOSIS — R051 Acute cough: Secondary | ICD-10-CM | POA: Diagnosis not present

## 2022-11-03 DIAGNOSIS — E1159 Type 2 diabetes mellitus with other circulatory complications: Secondary | ICD-10-CM | POA: Diagnosis not present

## 2022-11-03 DIAGNOSIS — Z2821 Immunization not carried out because of patient refusal: Secondary | ICD-10-CM

## 2022-11-03 DIAGNOSIS — E1169 Type 2 diabetes mellitus with other specified complication: Secondary | ICD-10-CM

## 2022-11-03 DIAGNOSIS — N63 Unspecified lump in unspecified breast: Secondary | ICD-10-CM

## 2022-11-03 DIAGNOSIS — E6609 Other obesity due to excess calories: Secondary | ICD-10-CM

## 2022-11-03 DIAGNOSIS — E66812 Obesity, class 2: Secondary | ICD-10-CM | POA: Insufficient documentation

## 2022-11-03 LAB — POCT URINALYSIS DIPSTICK
Bilirubin, UA: NEGATIVE
Glucose, UA: NEGATIVE
Ketones, UA: NEGATIVE
Leukocytes, UA: NEGATIVE
Nitrite, UA: NEGATIVE
Protein, UA: POSITIVE — AB
Spec Grav, UA: 1.015 (ref 1.010–1.025)
Urobilinogen, UA: 1 E.U./dL
pH, UA: 7 (ref 5.0–8.0)

## 2022-11-03 MED ORDER — TELMISARTAN-HCTZ 80-25 MG PO TABS: 1.0000 | ORAL_TABLET | Freq: Every day | ORAL | 2 refills | Status: DC

## 2022-11-03 MED ORDER — OMEPRAZOLE 40 MG PO CPDR: 40.0000 mg | DELAYED_RELEASE_CAPSULE | Freq: Every day | ORAL | 2 refills | Status: DC

## 2022-11-03 MED ORDER — CETIRIZINE HCL 10 MG PO TABS: 10.0000 mg | ORAL_TABLET | Freq: Every day | ORAL | 2 refills | Status: DC

## 2022-11-03 MED ORDER — MOUNJARO 2.5 MG/0.5ML ~~LOC~~ SOAJ: 2.5000 mg | SUBCUTANEOUS | 0 refills | Status: DC

## 2022-11-03 MED ORDER — AMLODIPINE BESYLATE 5 MG PO TABS: 5.0000 mg | ORAL_TABLET | Freq: Every day | ORAL | 2 refills | Status: DC

## 2022-11-03 MED ORDER — METOPROLOL SUCCINATE ER 50 MG PO TB24: 50.0000 mg | ORAL_TABLET | Freq: Every day | ORAL | 2 refills | Status: DC

## 2022-11-03 NOTE — Progress Notes (Signed)
I,Victoria T Hamilton,acting as a scribe for [name], MD.,have documented all relevant documentation on the behalf of [name], MD,as directed by  [name], MD while in the presence of [name], MD.   Subjective:     Patient ID: Gabrielle Perkins , female    DOB: 09/23/1975 , 47 y.o.   MRN: [id number]   Chief Complaint  Patient presents with   Annual Exam   Diabetes   Hypertension    HPI  She is here today for a full physical examination.  She is followed by Dr. Cletis Media for her pelvic exams. Her last visit was 10/12/2021 w/her GYN.  She was seen at her new facility. She has not been seen here since March 2023. She states she had to reschedule her last appt.  She denies headaches, chest pain and shortness of breath. She has no specific concerns or complaints at this time. She states her sugars are running high, between 180-220. She admits she has not called the office to notify us of elevated blood sugars.   She adds that her eye exam is scheduled for April.    Diabetes She presents for her follow-up diabetic visit. She has type 2 diabetes mellitus. There are no hypoglycemic associated symptoms. There are no diabetic associated symptoms. There are no hypoglycemic complications. Risk factors for coronary artery disease include diabetes mellitus, dyslipidemia, hypertension, obesity and sedentary lifestyle. She participates in exercise intermittently. Eye exam is current.  Hypertension This is a chronic problem. The current episode started more than 1 year ago. The problem has been gradually improving since onset. The problem is controlled. Risk factors for coronary artery disease include diabetes mellitus, obesity and sedentary lifestyle. Past treatments include angiotensin blockers and diuretics. The current treatment provides moderate improvement. Compliance problems include exercise.      Past Medical History:  Diagnosis Date   Diabetes mellitus without  complication (HCC)    GERD (gastroesophageal reflux disease)    Hypertension      Family History  Problem Relation Age of Onset   Hypertension Mother    Diabetes Mother    Hypertension Father    Diabetes Father    Sarcoidosis Father    Hypertension Maternal Grandmother    Coronary artery disease Maternal Grandmother    Hypertension Maternal Grandfather    Breast cancer Neg Hx      Current Outpatient Medications:    acetaminophen (TYLENOL) 500 MG tablet, Take 500 mg by mouth every 6 (six) hours as needed., Disp: , Rfl:    Blood Glucose Monitoring Suppl (FREESTYLE FREEDOM LITE) w/Device KIT, Use as directed to check blood sugars 2 times per day dx: e11.22, Disp: 1 kit, Rfl: 1   diclofenac (VOLTAREN) 75 MG EC tablet, Take 1 tablet (75 mg total) by mouth 2 (two) times daily., Disp: 50 tablet, Rfl: 2   diclofenac Sodium (VOLTAREN) 1 % GEL, Apply 2 g topically 4 (four) times daily., Disp: 100 g, Rfl: 2   fluticasone (FLONASE) 50 MCG/ACT nasal spray, Place 2 sprays into both nostrils daily., Disp: 16 g, Rfl: 2   glucose blood (FREESTYLE LITE) test strip, Use as instructed to check blood sugars 2 times per day dx: e11.22, Disp: 150 each, Rfl: 2   levonorgestrel (MIRENA, 52 MG,) 20 MCG/DAY IUD, Mirena 20 mcg/24 hours (7 yrs) 52 mg intrauterine device  Take 1 device by intrauterine route., Disp: , Rfl:    metFORMIN (GLUCOPHAGE-XR) 500 MG 24 hr tablet, Take 2  tablets (1,000 mg total) by mouth daily with supper., Disp: 90 tablet, Rfl: 2   Microlet Lancets MISC, Use as directed to check blood sugars 2 times per day dx: e11.22, Disp: 150 each, Rfl: 2   tirzepatide (MOUNJARO) 2.5 MG/0.5ML Pen, Inject 2.5 mg into the skin once a week., Disp: 2 mL, Rfl: 0   amLODipine (NORVASC) 5 MG tablet, Take 1 tablet (5 mg total) by mouth daily., Disp: 90 tablet, Rfl: 2   cetirizine (ZYRTEC) 10 MG tablet, Take 1 tablet (10 mg total) by mouth daily., Disp: 90 tablet, Rfl: 2   Iron-FA-B Cmp-C-Biot-Probiotic (FUSION  PLUS) CAPS, One capsule po qd (Patient not taking: Reported on 11/03/2022), Disp: 90 capsule, Rfl: 3   metoprolol succinate (TOPROL XL) 50 MG 24 hr tablet, Take 1 tablet (50 mg total) by mouth daily., Disp: 90 tablet, Rfl: 2   omeprazole (PRILOSEC) 40 MG capsule, Take 1 capsule (40 mg total) by mouth daily., Disp: 90 capsule, Rfl: 2   telmisartan-hydrochlorothiazide (MICARDIS HCT) 80-25 MG tablet, Take 1 tablet by mouth daily., Disp: 90 tablet, Rfl: 2  Current Facility-Administered Medications:    triamcinolone acetonide (KENALOG) 10 MG/ML injection 10 mg, 10 mg, Other, Once, Regal, Norman S, DPM   No Known Allergies    The patient states she uses tubal ligation for birth control. Last LMP was Patient's last menstrual period was 11/01/2022 (exact date).. Negative for Dysmenorrhea. Negative for: breast discharge, breast lump(s), breast pain and breast self exam. Associated symptoms include abnormal vaginal bleeding. Pertinent negatives include abnormal bleeding (hematology), anxiety, decreased libido, depression, difficulty falling sleep, dyspareunia, history of infertility, nocturia, sexual dysfunction, sleep disturbances, urinary incontinence, urinary urgency, vaginal discharge and vaginal itching. Diet regular.The patient states her exercise level is  intermittent.  . The patient's tobacco use is:  Social History   Tobacco Use  Smoking Status Never  Smokeless Tobacco Never  . She has been exposed to passive smoke. The patient's alcohol use is:  Social History   Substance and Sexual Activity  Alcohol Use No    Review of Systems  Constitutional: Negative.   HENT: Negative.    Eyes: Negative.   Respiratory:  Positive for cough.        She c/o dry cough. States a bug has gone around her office. She is feeling better now. She did take some Mucinex with relief of her sx. At this time, she is not having any fever/chills/body aches.   Gastrointestinal: Negative.   Endocrine: Negative.    Genitourinary: Negative.   Musculoskeletal: Negative.   Skin: Negative.   Allergic/Immunologic: Negative.   Neurological: Negative.   Hematological: Negative.   Psychiatric/Behavioral: Negative.       Today's Vitals   11/03/22 1017  BP: 126/84  Pulse: 76  Temp: 97.9 F (36.6 C)  SpO2: 98%  Weight: 231 lb (104.8 kg)  Height: 5' 4"$  (1.626 m)   Body mass index is 39.65 kg/m.  Wt Readings from Last 3 Encounters:  11/03/22 231 lb (104.8 kg)  10/12/22 234 lb 6.4 oz (106.3 kg)  09/07/22 234 lb 9.6 oz (106.4 kg)    Objective:  Physical Exam Vitals and nursing note reviewed.  Constitutional:      Appearance: Normal appearance.  HENT:     Head: Normocephalic and atraumatic.     Right Ear: Tympanic membrane, ear canal and external ear normal.     Left Ear: Tympanic membrane, ear canal and external ear normal.     Nose:  Comments: Masked     Mouth/Throat:     Comments: Masked  Eyes:     Extraocular Movements: Extraocular movements intact.     Conjunctiva/sclera: Conjunctivae normal.     Pupils: Pupils are equal, round, and reactive to light.  Cardiovascular:     Rate and Rhythm: Normal rate and regular rhythm.     Pulses: Normal pulses.          Dorsalis pedis pulses are 2+ on the right side and 2+ on the left side.     Heart sounds: Normal heart sounds.  Pulmonary:     Effort: Pulmonary effort is normal.     Breath sounds: Normal breath sounds.  Chest:  Breasts:    Tanner Score is 5.     Right: Normal.     Left: Mass present.       Comments: Mass, nontender, about 0.5cm Abdominal:     General: Bowel sounds are normal.     Palpations: Abdomen is soft.  Genitourinary:    Comments: deferred Musculoskeletal:        General: Normal range of motion.     Cervical back: Normal range of motion and neck supple.  Feet:     Right foot:     Protective Sensation: 5 sites tested.  5 sites sensed.     Skin integrity: Dry skin present.     Toenail Condition: Right  toenails are normal.     Left foot:     Protective Sensation: 5 sites tested.  5 sites sensed.     Skin integrity: Dry skin present.     Toenail Condition: Left toenails are normal.  Skin:    General: Skin is warm and dry.  Neurological:     General: No focal deficit present.     Mental Status: She is alert and oriented to person, place, and time.  Psychiatric:        Mood and Affect: Mood normal.        Behavior: Behavior normal.         Assessment And Plan:     1. Encounter for general adult medical examination w/o abnormal findings Comments: A full exam was performed. Importance of monthly self breast exams was discussed with the patient.  PATIENT IS ADVISED TO GET 30-45 MINUTES REGULAR EXERCISE NO LESS THAN FOUR TO FIVE DAYS PER WEEK - BOTH WEIGHTBEARING EXERCISES AND AEROBIC ARE RECOMMENDED.  PATIENT IS ADVISED TO FOLLOW A HEALTHY DIET WITH AT LEAST SIX FRUITS/VEGGIES PER DAY, DECREASE INTAKE OF RED MEAT, AND TO INCREASE FISH INTAKE TO TWO DAYS PER WEEK.  MEATS/FISH SHOULD NOT BE FRIED, BAKED OR BROILED IS PREFERABLE.  IT IS ALSO IMPORTANT TO CUT BACK ON YOUR SUGAR INTAKE. PLEASE AVOID ANYTHING WITH ADDED SUGAR, CORN SYRUP OR OTHER SWEETENERS. IF YOU MUST USE A SWEETENER, YOU CAN TRY STEVIA. IT IS ALSO IMPORTANT TO AVOID ARTIFICIALLY SWEETENERS AND DIET BEVERAGES. LASTLY, I SUGGEST WEARING SPF 50 SUNSCREEN ON EXPOSED PARTS AND ESPECIALLY WHEN IN THE DIRECT SUNLIGHT FOR AN EXTENDED PERIOD OF TIME.  PLEASE AVOID FAST FOOD RESTAURANTS AND INCREASE YOUR WATER INTAKE.  2. Obesity, diabetes, and hypertension syndrome ([place]) Comments: Chronic, diabetic foot exam was performed. She has been out of Ozempic for several months. I will d/c Ozempic and start Mounjaro 17m weekly.  I DISCUSSED WITH THE PATIENT AT LENGTH REGARDING THE GOALS OF GLYCEMIC CONTROL AND POSSIBLE LONG-TERM COMPLICATIONS.  I  ALSO STRESSED THE IMPORTANCE OF COMPLIANCE WITH HOME GLUCOSE MONITORING, DIETARY RESTRICTIONS  INCLUDING  AVOIDANCE OF SUGARY DRINKS/PROCESSED FOODS,  ALONG WITH REGULAR EXERCISE.  I  ALSO STRESSED THE IMPORTANCE OF ANNUAL EYE EXAMS, SELF FOOT CARE AND COMPLIANCE WITH OFFICE VISITS.  - POCT Urinalysis Dipstick (81002) - Microalbumin / creatinine urine ratio - EKG 12-Lead - CMP14+EGFR - CBC - Hemoglobin A1c - Lipid panel - TSH  3. Acute cough Comments: Possibly due to postnasal drip. She will resume Zyrtec nightly. Encouraged to  avoid dairy products.  4. Breast mass in female Comments: She agrees to referral for diagnostic mammo and u/s of left breast. - MM Digital Diagnostic Unilat L; Future - [place] Unlisted Procedure Breast; Future  5. Vitamin B12 deficiency Comments: I will check vitamin B12 level and supplement as needed. - Vitamin B12  6. Class 2 obesity due to excess calories without serious comorbidity with body mass index (BMI) of 39.0 to 39.9 in adult Comments: She is encouraged to aim for at least 150 minutes of exercise/week, while initially striving for BMI<34 to decrease cardiac risk.  7. Influenza vaccination declined  Patient was given opportunity to ask questions. Patient verbalized understanding of the plan and was able to repeat key elements of the plan. All questions were answered to their satisfaction.   I, [name], MD, have reviewed all documentation for this visit. The documentation on 11/03/22 for the exam, diagnosis, procedures, and orders are all accurate and complete.   THE PATIENT IS ENCOURAGED TO PRACTICE SOCIAL DISTANCING DUE TO THE COVID-19 PANDEMIC.

## 2022-11-03 NOTE — Patient Instructions (Signed)

## 2022-11-04 LAB — CMP14+EGFR
ALT: 85 IU/L — ABNORMAL HIGH (ref 0–32)
AST: 77 IU/L — ABNORMAL HIGH (ref 0–40)
Albumin/Globulin Ratio: 1.3 (ref 1.2–2.2)
Albumin: 4.1 g/dL (ref 3.9–4.9)
Alkaline Phosphatase: 131 IU/L — ABNORMAL HIGH (ref 44–121)
BUN/Creatinine Ratio: 8 — ABNORMAL LOW (ref 9–23)
BUN: 6 mg/dL (ref 6–24)
Bilirubin Total: 0.3 mg/dL (ref 0.0–1.2)
CO2: 25 mmol/L (ref 20–29)
Calcium: 9.5 mg/dL (ref 8.7–10.2)
Chloride: 96 mmol/L (ref 96–106)
Creatinine, Ser: 0.72 mg/dL (ref 0.57–1.00)
Globulin, Total: 3.2 g/dL (ref 1.5–4.5)
Glucose: 197 mg/dL — ABNORMAL HIGH (ref 70–99)
Potassium: 3.4 mmol/L — ABNORMAL LOW (ref 3.5–5.2)
Sodium: 138 mmol/L (ref 134–144)
Total Protein: 7.3 g/dL (ref 6.0–8.5)
eGFR: 105 mL/min/{1.73_m2} (ref >59)

## 2022-11-04 LAB — CBC
Hematocrit: 37.2 % (ref 34.0–46.6)
Hemoglobin: 12.1 g/dL (ref 11.1–15.9)
MCH: 27.5 pg (ref 26.6–33.0)
MCHC: 32.5 g/dL (ref 31.5–35.7)
MCV: 85 fL (ref 79–97)
Platelets: 482 10*3/uL — ABNORMAL HIGH (ref 150–450)
RBC: 4.4 x10E6/uL (ref 3.77–5.28)
RDW: 12.8 % (ref 11.7–15.4)
WBC: 5.9 10*3/uL (ref 3.4–10.8)

## 2022-11-04 LAB — HEMOGLOBIN A1C
Est. average glucose Bld gHb Est-mCnc: 240 mg/dL
Hgb A1c MFr Bld: 10 % — ABNORMAL HIGH (ref 4.8–5.6)

## 2022-11-04 LAB — LIPID PANEL
Chol/HDL Ratio: 3.7 ratio (ref 0.0–4.4)
Cholesterol, Total: 110 mg/dL (ref 100–199)
HDL: 30 mg/dL — ABNORMAL LOW (ref >39)
LDL Chol Calc (NIH): 58 mg/dL (ref 0–99)
Triglycerides: 121 mg/dL (ref 0–149)
VLDL Cholesterol Cal: 22 mg/dL (ref 5–40)

## 2022-11-04 LAB — MICROALBUMIN / CREATININE URINE RATIO
Creatinine, Urine: 31.6 mg/dL
Microalb/Creat Ratio: 766 mg/g creat — ABNORMAL HIGH (ref 0–29)
Microalbumin, Urine: 242 ug/mL

## 2022-11-04 LAB — VITAMIN B12: Vitamin B-12: 619 pg/mL (ref 232–1245)

## 2022-11-04 LAB — TSH: TSH: 0.575 u[IU]/mL (ref 0.450–4.500)

## 2022-11-07 ENCOUNTER — Other Ambulatory Visit: Payer: Self-pay | Admitting: Internal Medicine

## 2022-11-08 NOTE — Telephone Encounter (Signed)
Is this approved yet?

## 2022-11-10 ENCOUNTER — Other Ambulatory Visit (HOSPITAL_COMMUNITY): Payer: Self-pay

## 2022-11-10 ENCOUNTER — Encounter: Payer: Self-pay | Admitting: Hematology

## 2022-11-10 ENCOUNTER — Other Ambulatory Visit: Payer: Self-pay

## 2022-11-10 MED ORDER — MOUNJARO 2.5 MG/0.5ML ~~LOC~~ SOAJ
2.5000 mg | SUBCUTANEOUS | 0 refills | Status: DC
  Filled 2022-11-10: qty 2, 28d supply, fill #0

## 2022-11-10 MED ORDER — MOUNJARO 2.5 MG/0.5ML ~~LOC~~ SOAJ: 2.5000 mg | SUBCUTANEOUS | 0 refills | Status: DC

## 2022-11-15 ENCOUNTER — Other Ambulatory Visit: Payer: Self-pay | Admitting: Internal Medicine

## 2022-11-18 ENCOUNTER — Encounter: Payer: Self-pay | Admitting: Internal Medicine

## 2022-11-22 ENCOUNTER — Other Ambulatory Visit: Payer: Self-pay | Admitting: Internal Medicine

## 2022-11-22 ENCOUNTER — Other Ambulatory Visit: Payer: Self-pay

## 2022-11-22 MED ORDER — SEMAGLUTIDE(0.25 OR 0.5MG/DOS) 2 MG/3ML ~~LOC~~ SOPN: 2.0000 mg | PEN_INJECTOR | SUBCUTANEOUS | 1 refills | Status: DC

## 2022-11-24 ENCOUNTER — Other Ambulatory Visit (HOSPITAL_COMMUNITY): Payer: Self-pay

## 2022-11-24 ENCOUNTER — Other Ambulatory Visit: Payer: Self-pay

## 2022-11-24 MED ORDER — MOUNJARO 2.5 MG/0.5ML ~~LOC~~ SOAJ
2.5000 mg | SUBCUTANEOUS | 0 refills | Status: DC
  Filled 2022-11-24: qty 2, 28d supply, fill #0

## 2022-11-25 ENCOUNTER — Other Ambulatory Visit: Payer: Self-pay | Admitting: Internal Medicine

## 2022-11-26 ENCOUNTER — Ambulatory Visit
Admission: RE | Admit: 2022-11-26 | Discharge: 2022-11-26 | Disposition: A | Discharge status: Home / Self Care | Source: Ambulatory Visit | Attending: Internal Medicine | Admitting: Internal Medicine

## 2022-11-26 DIAGNOSIS — N63 Unspecified lump in unspecified breast: Secondary | ICD-10-CM

## 2022-11-30 ENCOUNTER — Ambulatory Visit: Admitting: Neurology

## 2022-12-13 ENCOUNTER — Other Ambulatory Visit: Payer: Self-pay

## 2022-12-13 MED ORDER — SEMAGLUTIDE(0.25 OR 0.5MG/DOS) 2 MG/3ML ~~LOC~~ SOPN: 2.0000 mg | PEN_INJECTOR | SUBCUTANEOUS | 1 refills | Status: DC

## 2022-12-20 ENCOUNTER — Encounter: Payer: Self-pay | Admitting: Internal Medicine

## 2022-12-20 ENCOUNTER — Ambulatory Visit (INDEPENDENT_AMBULATORY_CARE_PROVIDER_SITE_OTHER): Admitting: Internal Medicine

## 2022-12-20 ENCOUNTER — Other Ambulatory Visit: Payer: Self-pay

## 2022-12-20 VITALS — BP 130/88 | HR 77 | Temp 98.4°F | Ht 64.0 in | Wt 233.4 lb

## 2022-12-20 DIAGNOSIS — Z6841 Body Mass Index (BMI) 40.0 and over, adult: Secondary | ICD-10-CM

## 2022-12-20 DIAGNOSIS — Z1211 Encounter for screening for malignant neoplasm of colon: Secondary | ICD-10-CM

## 2022-12-20 DIAGNOSIS — E1165 Type 2 diabetes mellitus with hyperglycemia: Secondary | ICD-10-CM

## 2022-12-20 DIAGNOSIS — I10 Essential (primary) hypertension: Secondary | ICD-10-CM | POA: Diagnosis not present

## 2022-12-20 MED ORDER — FLUTICASONE PROPIONATE 50 MCG/ACT NA SUSP: 2.0000 | Freq: Every day | NASAL | 2 refills | Status: AC

## 2022-12-20 MED ORDER — TELMISARTAN-HCTZ 80-25 MG PO TABS: 1.0000 | ORAL_TABLET | Freq: Every day | ORAL | 2 refills | Status: DC

## 2022-12-20 MED ORDER — METFORMIN HCL ER 500 MG PO TB24: 1000.0000 mg | ORAL_TABLET | Freq: Every day | ORAL | 2 refills | Status: DC

## 2022-12-20 MED ORDER — EMPAGLIFLOZIN 25 MG PO TABS: 25.0000 mg | ORAL_TABLET | Freq: Every day | ORAL | 5 refills | Status: DC

## 2022-12-20 MED ORDER — AMLODIPINE BESYLATE 5 MG PO TABS: 5.0000 mg | ORAL_TABLET | Freq: Every day | ORAL | 2 refills | Status: DC

## 2022-12-20 MED ORDER — CETIRIZINE HCL 10 MG PO TABS: 10.0000 mg | ORAL_TABLET | Freq: Every day | ORAL | 2 refills | Status: DC

## 2022-12-20 MED ORDER — OMEPRAZOLE 40 MG PO CPDR: 40.0000 mg | DELAYED_RELEASE_CAPSULE | Freq: Every day | ORAL | 2 refills | Status: DC

## 2022-12-20 NOTE — Progress Notes (Signed)
I,Victoria T Hamilton,acting as a scribe for [name], MD.,have documented all relevant documentation on the behalf of [name], MD,as directed by  [name], MD while in the presence of [name], MD.    Subjective:     Patient ID: Gabrielle Perkins , female    DOB: March 24, 1976 , 47 y.o.   MRN: [id number]   Chief Complaint  Patient presents with   Diabetes   Hypertension    HPI  Patient presents today for Farxiga follow up. She reports compliance with medication. She did not have any issues with the medication. She denies having headaches, chest pain and shortness of breath.   She reports having a DM eye exam scheduled on April 16th.  Also, patient would like referral to GI for colonoscopy.        Diabetes She presents for her follow-up diabetic visit. She has type 2 diabetes mellitus. There are no hypoglycemic associated symptoms. There are no diabetic associated symptoms. There are no hypoglycemic complications. Risk factors for coronary artery disease include diabetes mellitus, dyslipidemia, hypertension, obesity and sedentary lifestyle. Current diabetic treatment includes oral agent (monotherapy). She is compliant with treatment some of the time. She is following a generally healthy diet. She participates in exercise intermittently. Her breakfast blood glucose is taken between 8-9 am. Her breakfast blood glucose range is generally 90-110 mg/dl. (97/-120 blood sugar) Eye exam is current.  Hypertension This is a chronic problem. The current episode started more than 1 year ago. The problem has been gradually improving since onset. The problem is controlled. Past treatments include angiotensin blockers and diuretics. The current treatment provides moderate improvement. Compliance problems include exercise.      Past Medical History:  Diagnosis Date   Diabetes mellitus without complication    GERD (gastroesophageal reflux disease)    Hypertension       Family History  Problem Relation Age of Onset   Hypertension Mother    Diabetes Mother    Hypertension Father    Diabetes Father    Sarcoidosis Father    Hypertension Maternal Grandmother    Coronary artery disease Maternal Grandmother    Hypertension Maternal Grandfather    Breast cancer Neg Hx      Current Outpatient Medications:    acetaminophen (TYLENOL) 500 MG tablet, Take 500 mg by mouth every 6 (six) hours as needed., Disp: , Rfl:    Blood Glucose Monitoring Suppl (FREESTYLE FREEDOM LITE) w/Device KIT, Use as directed to check blood sugars 2 times per day dx: e11.22, Disp: 1 kit, Rfl: 1   diclofenac (VOLTAREN) 75 MG EC tablet, Take 1 tablet (75 mg total) by mouth 2 (two) times daily., Disp: 50 tablet, Rfl: 2   diclofenac Sodium (VOLTAREN) 1 % GEL, Apply 2 g topically 4 (four) times daily., Disp: 100 g, Rfl: 2   empagliflozin (JARDIANCE) 25 MG TABS tablet, Take 1 tablet (25 mg total) by mouth daily before breakfast., Disp: 30 tablet, Rfl: 5   glucose blood (FREESTYLE LITE) test strip, Use as instructed to check blood sugars 2 times per day dx: e11.22, Disp: 150 each, Rfl: 2   levonorgestrel (MIRENA, 52 MG,) 20 MCG/DAY IUD, Mirena 20 mcg/24 hours (7 yrs) 52 mg intrauterine device  Take 1 device by intrauterine route., Disp: , Rfl:    metoprolol succinate (TOPROL XL) 50 MG 24 hr tablet, Take 1 tablet (50 mg total) by mouth daily., Disp: 90 tablet, Rfl: 2   Microlet Lancets MISC, Use  as directed to check blood sugars 2 times per day dx: e11.22, Disp: 150 each, Rfl: 2   Semaglutide,0.25 or 0.5MG /DOS, 2 MG/3ML SOPN, Inject 2 mg into the skin once a week., Disp: 3 mL, Rfl: 1   amLODipine (NORVASC) 5 MG tablet, Take 1 tablet (5 mg total) by mouth daily., Disp: 90 tablet, Rfl: 2   cetirizine (ZYRTEC) 10 MG tablet, Take 1 tablet (10 mg total) by mouth daily., Disp: 90 tablet, Rfl: 2   fluticasone (FLONASE) 50 MCG/ACT nasal spray, Place 2 sprays into both nostrils daily., Disp: 16 g, Rfl:  2   Iron-FA-B Cmp-C-Biot-Probiotic (FUSION PLUS) CAPS, One capsule po qd (Patient not taking: Reported on 11/03/2022), Disp: 90 capsule, Rfl: 3   metFORMIN (GLUCOPHAGE-XR) 500 MG 24 hr tablet, Take 2 tablets (1,000 mg total) by mouth daily with supper., Disp: 90 tablet, Rfl: 2   omeprazole (PRILOSEC) 40 MG capsule, Take 1 capsule (40 mg total) by mouth daily., Disp: 90 capsule, Rfl: 2   telmisartan-hydrochlorothiazide (MICARDIS HCT) 80-25 MG tablet, Take 1 tablet by mouth daily., Disp: 90 tablet, Rfl: 2  Current Facility-Administered Medications:    triamcinolone acetonide (KENALOG) 10 MG/ML injection 10 mg, 10 mg, Other, Once, Regal, Tamala Fothergill, DPM   No Known Allergies   Review of Systems  Constitutional: Negative.   Respiratory: Negative.    Cardiovascular: Negative.   Gastrointestinal: Negative.   Musculoskeletal: Negative.   Neurological: Negative.   Psychiatric/Behavioral: Negative.       Today's Vitals   12/20/22 0904 12/20/22 0919  BP: (!) 142/90 130/88  Pulse: 77   Temp: 98.4 F (36.9 C)   SpO2: 98%   Weight: 233 lb 6.4 oz (105.9 kg)   Height: 5\' 4"  (1.626 m)    Body mass index is 40.06 kg/m.  Wt Readings from Last 3 Encounters:  12/20/22 233 lb 6.4 oz (105.9 kg)  11/03/22 231 lb (104.8 kg)  10/12/22 234 lb 6.4 oz (106.3 kg)    Objective:  Physical Exam Vitals and nursing note reviewed.  Constitutional:      Appearance: Normal appearance. She is obese.  HENT:     Head: Normocephalic and atraumatic.     Nose:     Comments: Masked     Mouth/Throat:     Comments: Masked  Eyes:     Extraocular Movements: Extraocular movements intact.  Cardiovascular:     Rate and Rhythm: Normal rate and regular rhythm.     Heart sounds: Normal heart sounds.  Pulmonary:     Effort: Pulmonary effort is normal.     Breath sounds: Normal breath sounds.  Musculoskeletal:     Cervical back: Normal range of motion.  Skin:    General: Skin is warm.  Neurological:     General:  No focal deficit present.     Mental Status: She is alert.  Psychiatric:        Mood and Affect: Mood normal.        Behavior: Behavior normal.      Assessment And Plan:     1. Uncontrolled type 2 diabetes mellitus with hyperglycemia Comments: Chronic, I will check BMP today for f/u Farxiga. It appears her insurance prefers [employer]. Will switch meds once labs have been reviewed. She agrees to f/u in 4 weeks for lab visit if meds are switched. She will f/u in 3 months for DM check. I do plan to also gradually increase the dose of Ozempic.  - Hemoglobin A1c - BMP8+EGFR  2.  Essential hypertension, benign Comments: Chronic, fair control. She will c/w telmisartan/hct 80/25, amlodipine 5mg  and metoprolol daily. She is encouraged to follow low sodium diet.  3. Class 3 severe obesity due to excess calories with serious comorbidity and body mass index (BMI) of 40.0 to 44.9 in adult Comments: BMI 40. She is encouraged to aim for at least 150 minutes of exercise/week.  4. Screen for colon cancer Comments: She agrees to GI referral for CRC screening. - Ambulatory referral to Gastroenterology  Patient was given opportunity to ask questions. Patient verbalized understanding of the plan and was able to repeat key elements of the plan. All questions were answered to their satisfaction.   I, [name], MD, have reviewed all documentation for this visit. The documentation on 12/20/22 for the exam, diagnosis, procedures, and orders are all accurate and complete.   IF YOU HAVE BEEN REFERRED TO A SPECIALIST, IT MAY TAKE 1-2 WEEKS TO SCHEDULE/PROCESS THE REFERRAL. IF YOU HAVE NOT HEARD FROM US/SPECIALIST IN TWO WEEKS, PLEASE GIVE [place] A CALL AT [phone] X [phone].   THE PATIENT IS ENCOURAGED TO PRACTICE SOCIAL DISTANCING DUE TO THE COVID-19 PANDEMIC.

## 2022-12-20 NOTE — Patient Instructions (Signed)

## 2022-12-25 LAB — BMP8+EGFR
BUN/Creatinine Ratio: 9 (ref 9–23)
BUN: 6 mg/dL (ref 6–24)
CO2: 23 mmol/L (ref 20–29)
Calcium: 9.3 mg/dL (ref 8.7–10.2)
Chloride: 101 mmol/L (ref 96–106)
Creatinine, Ser: 0.7 mg/dL (ref 0.57–1.00)
Glucose: 174 mg/dL — ABNORMAL HIGH (ref 70–99)
Potassium: 4.3 mmol/L (ref 3.5–5.2)
Sodium: 145 mmol/L — ABNORMAL HIGH (ref 134–144)
eGFR: 109 mL/min/{1.73_m2} (ref >59)

## 2022-12-25 LAB — HEMOGLOBIN A1C
Est. average glucose Bld gHb Est-mCnc: 217 mg/dL
Hgb A1c MFr Bld: 9.2 % — ABNORMAL HIGH (ref 4.8–5.6)

## 2022-12-27 ENCOUNTER — Other Ambulatory Visit: Payer: Self-pay

## 2022-12-27 MED ORDER — SEMAGLUTIDE(0.25 OR 0.5MG/DOS) 2 MG/3ML ~~LOC~~ SOPN: PEN_INJECTOR | SUBCUTANEOUS | 1 refills | Status: DC

## 2023-01-04 LAB — HM DIABETES EYE EXAM

## 2023-01-17 ENCOUNTER — Other Ambulatory Visit

## 2023-01-17 DIAGNOSIS — E1165 Type 2 diabetes mellitus with hyperglycemia: Secondary | ICD-10-CM

## 2023-01-17 LAB — HEMOGLOBIN A1C
Est. average glucose Bld gHb Est-mCnc: 220 mg/dL
Hgb A1c MFr Bld: 9.3 % — ABNORMAL HIGH (ref 4.8–5.6)

## 2023-01-31 ENCOUNTER — Other Ambulatory Visit: Payer: Self-pay

## 2023-01-31 ENCOUNTER — Encounter: Payer: Self-pay | Admitting: Internal Medicine

## 2023-03-01 ENCOUNTER — Other Ambulatory Visit: Payer: Self-pay

## 2023-03-01 MED ORDER — EMPAGLIFLOZIN 25 MG PO TABS: 25.0000 mg | ORAL_TABLET | Freq: Every day | ORAL | 5 refills | Status: DC

## 2023-03-01 MED ORDER — SEMAGLUTIDE(0.25 OR 0.5MG/DOS) 2 MG/3ML ~~LOC~~ SOPN: PEN_INJECTOR | SUBCUTANEOUS | 1 refills | Status: DC

## 2023-03-10 ENCOUNTER — Other Ambulatory Visit: Payer: Self-pay

## 2023-03-10 MED ORDER — METFORMIN HCL ER 500 MG PO TB24: 1000.0000 mg | ORAL_TABLET | Freq: Every day | ORAL | 1 refills | Status: DC

## 2023-03-15 ENCOUNTER — Ambulatory Visit: Admitting: Internal Medicine

## 2023-03-25 IMAGING — CR DG ANKLE COMPLETE 3+V*R*
3 series · 3 of 3 positions shown · non-contrast
Comparison: None Available.

CLINICAL DATA: Pain and swelling to right ankle. Chronic pain,
worsening. No known injury.

EXAM:
RIGHT ANKLE - COMPLETE 3+ VIEW

[t ankle joint ap right]
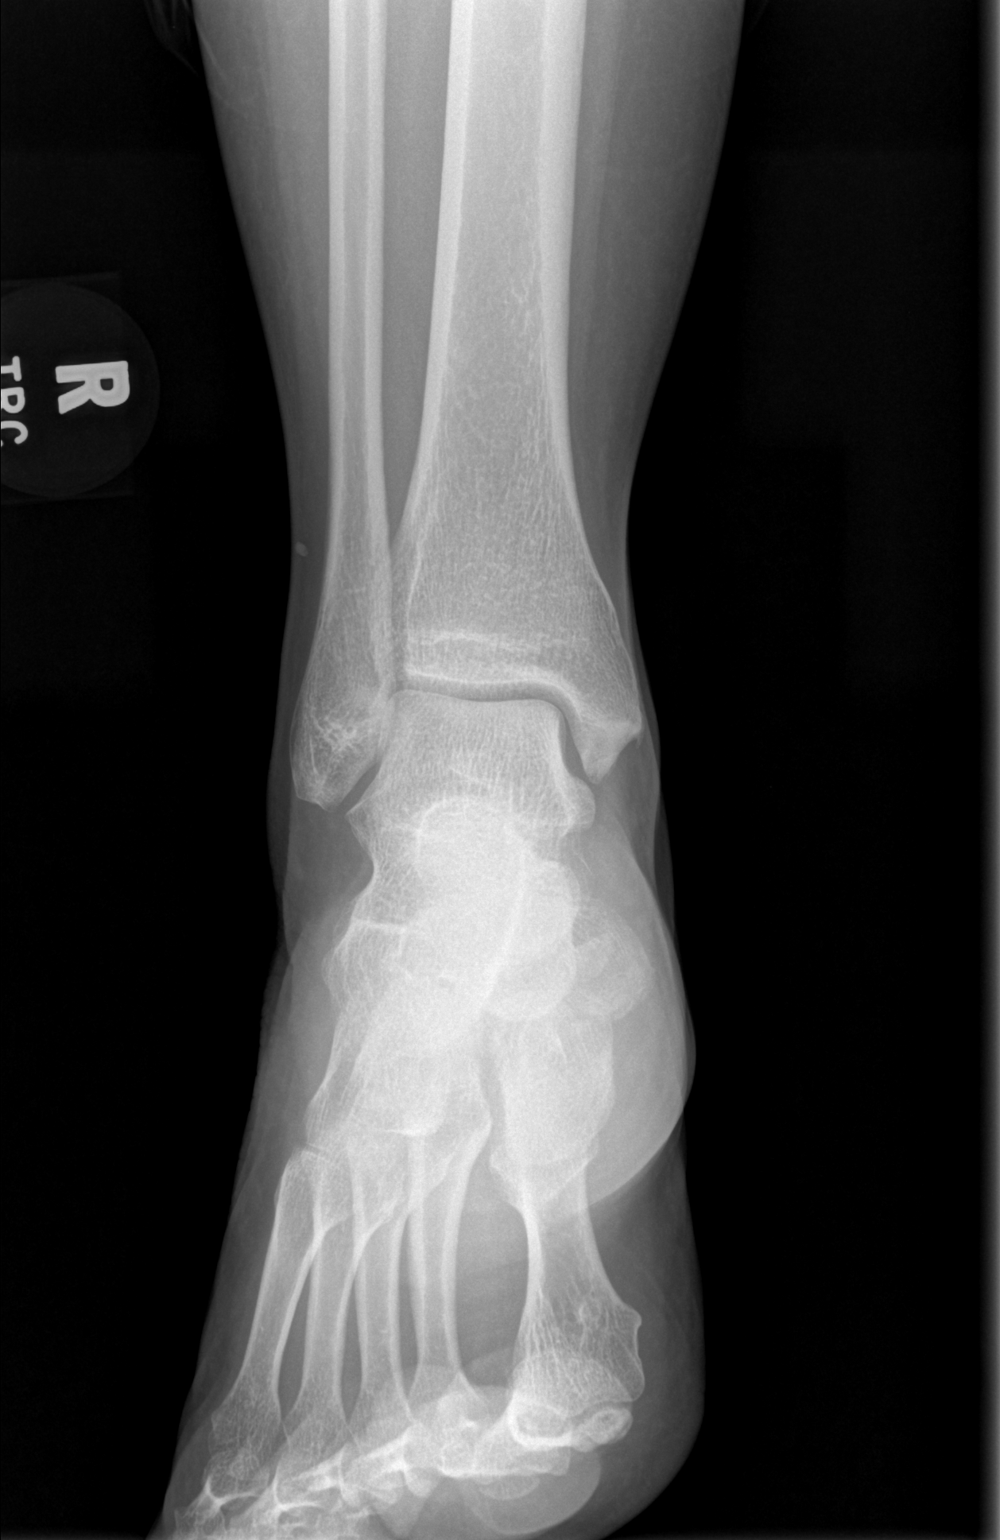

[t ankle joint oblique right]
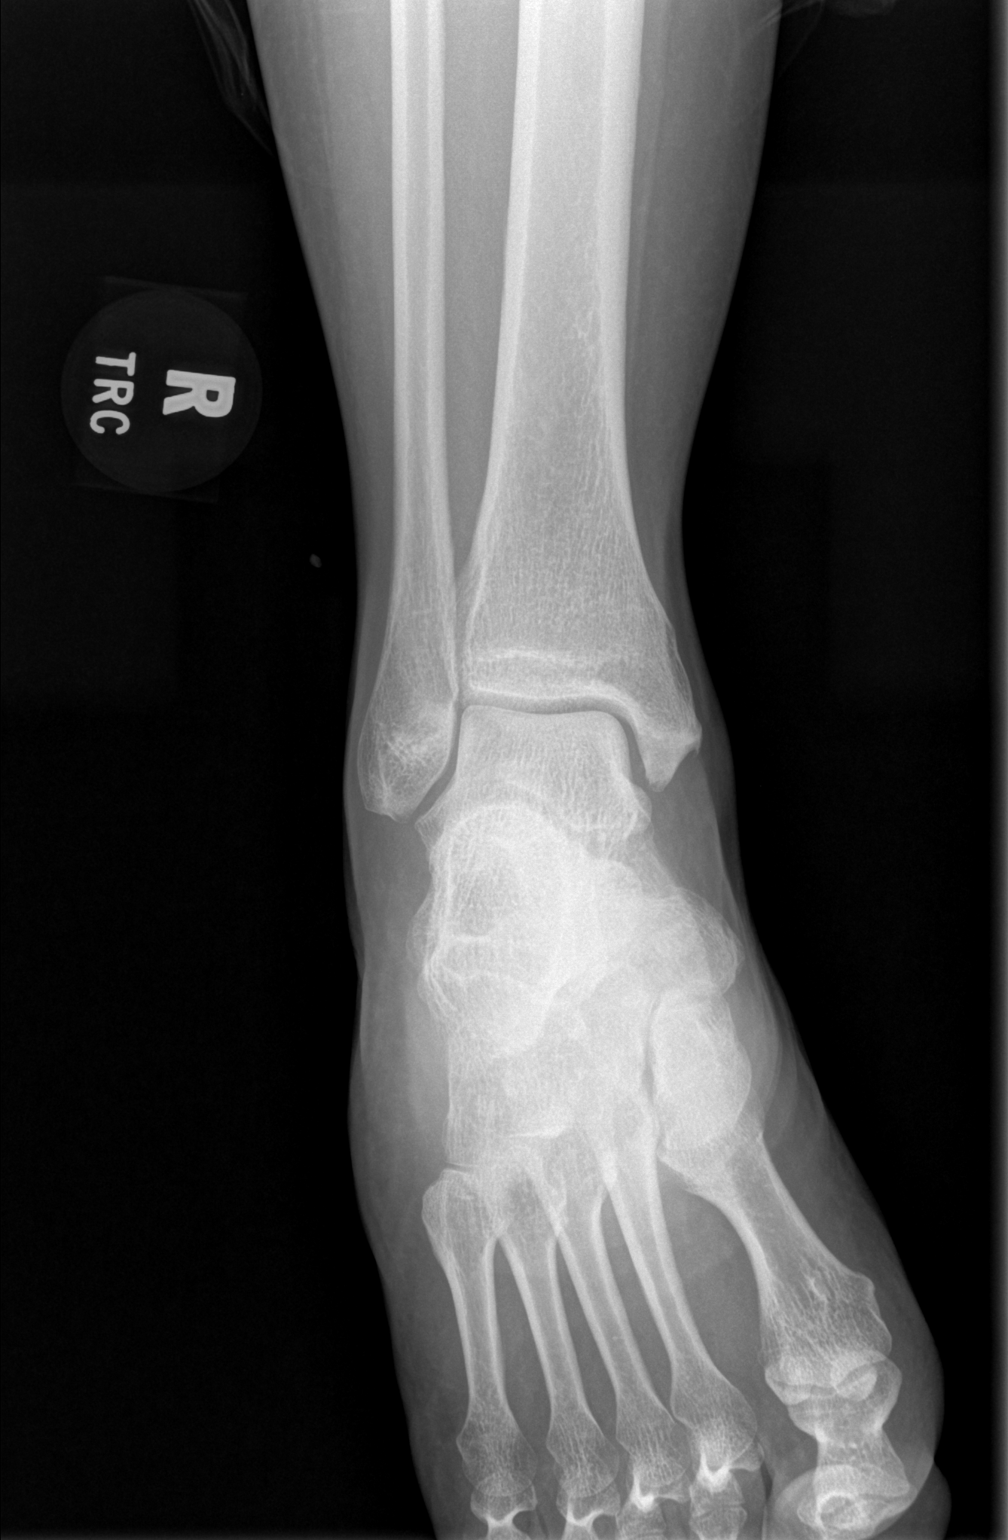

[t ankle joint lat right]
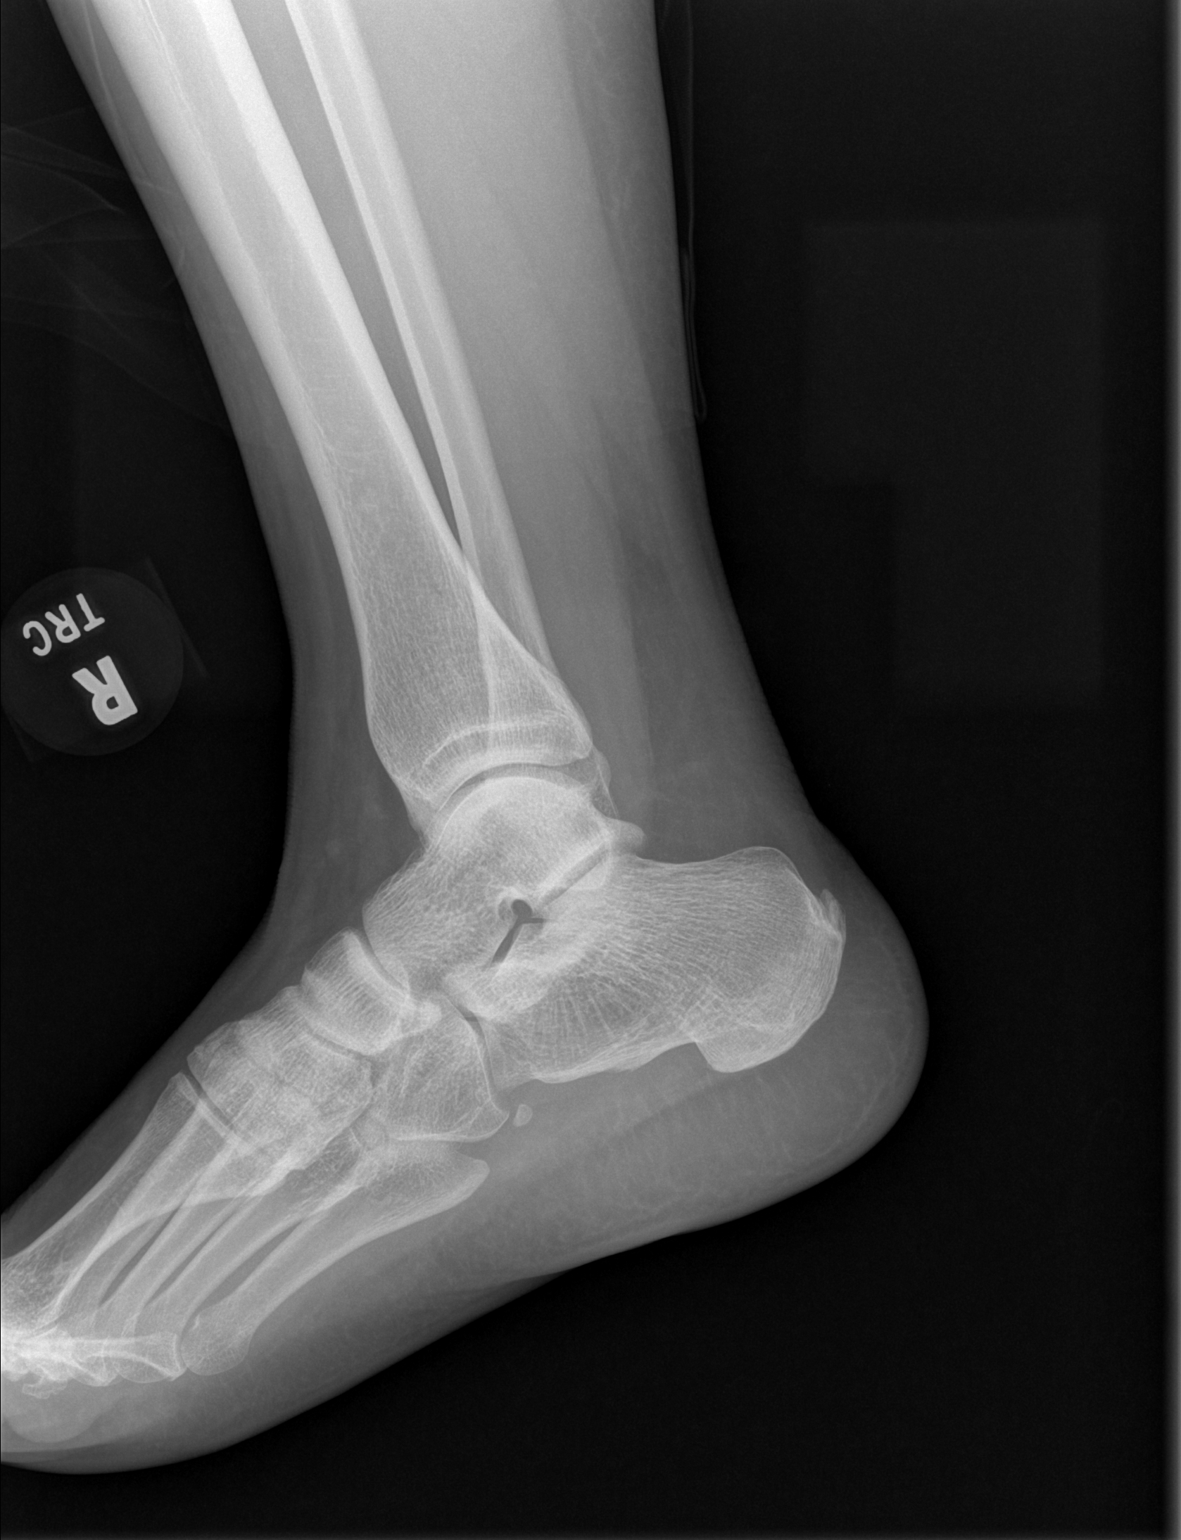

[3 of 3 positions shown; findings below may reference images not displayed]

FINDINGS: There is no evidence of fracture, dislocation, or joint effusion.
The ankle mortise is preserved. Intact talar dome. There is a small
Achilles tendon enthesophyte. There is no evidence of arthropathy or
other focal bone abnormality. Soft tissues are unremarkable.
IMPRESSION: Small Achilles tendon enthesophyte. Otherwise unremarkable
radiographs of the right ankle.

## 2023-03-25 IMAGING — CR DG KNEE COMPLETE 4+V*R*
4 series · 4 of 4 positions shown · non-contrast
Comparison: None Available.

CLINICAL DATA: Right knee pain. Chronic pain, worsening over the
past 4 months. No known injury.

EXAM:
RIGHT KNEE - COMPLETE 4+ VIEW

[t knee ap right]
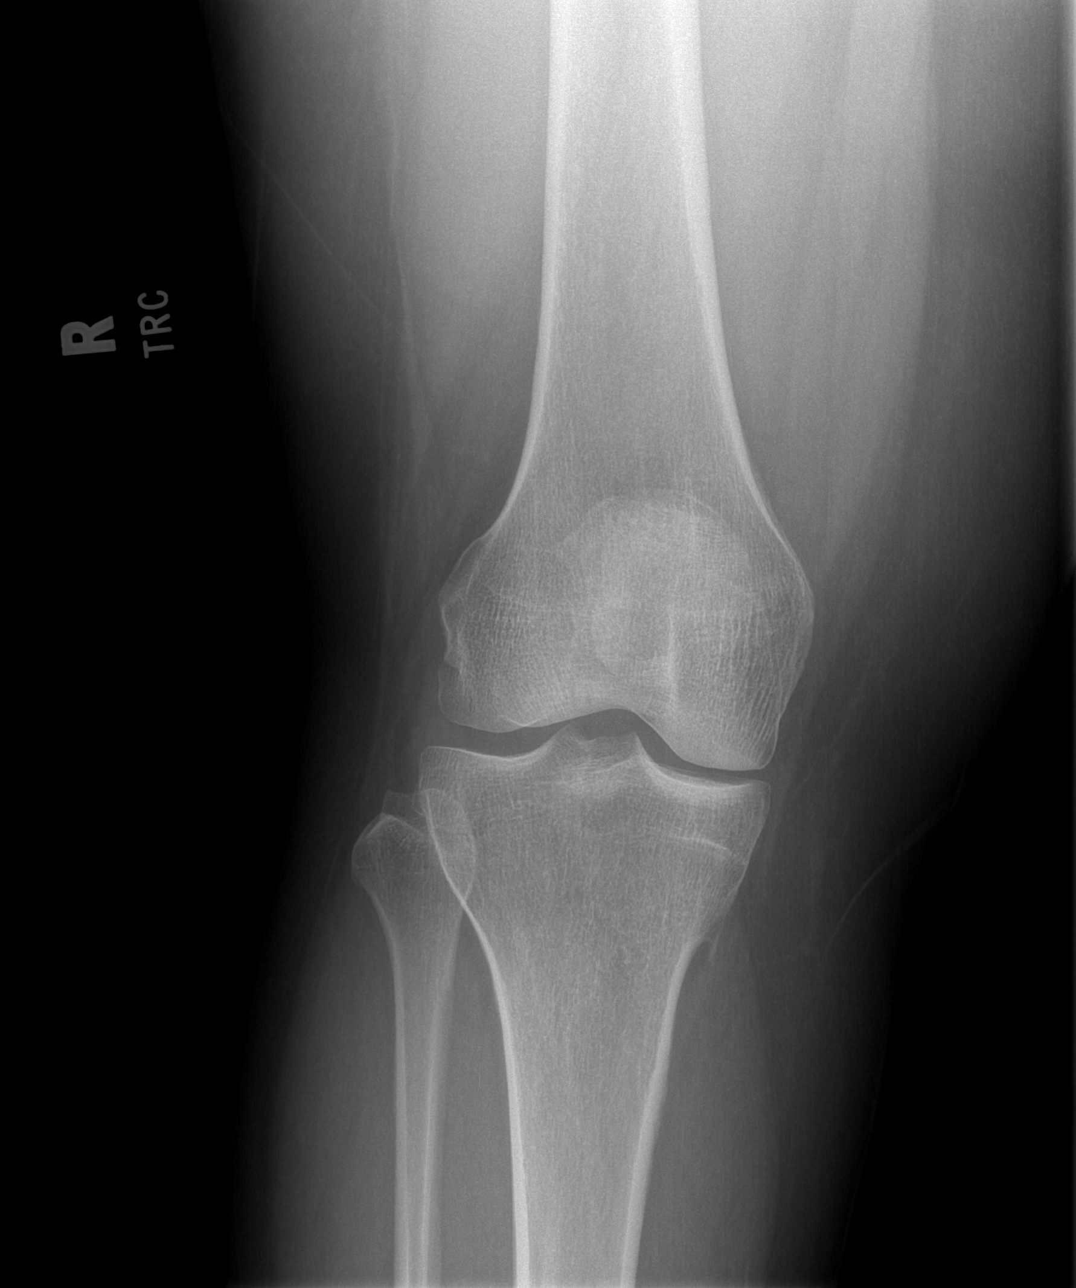

[t knee oblique right (1 of 2)]
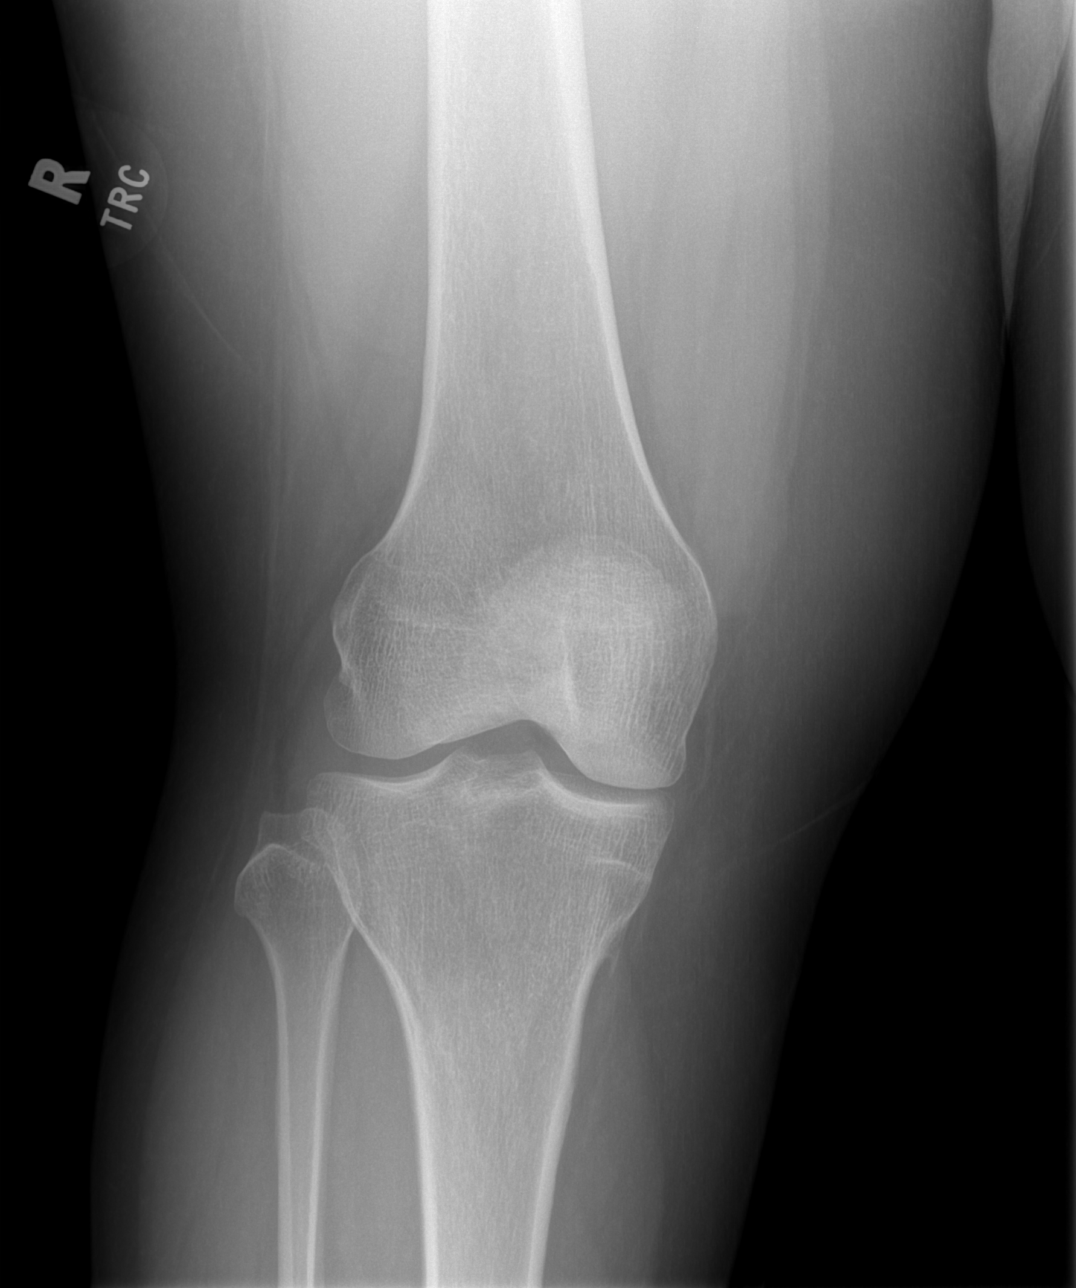

[t knee oblique right (2 of 2)]
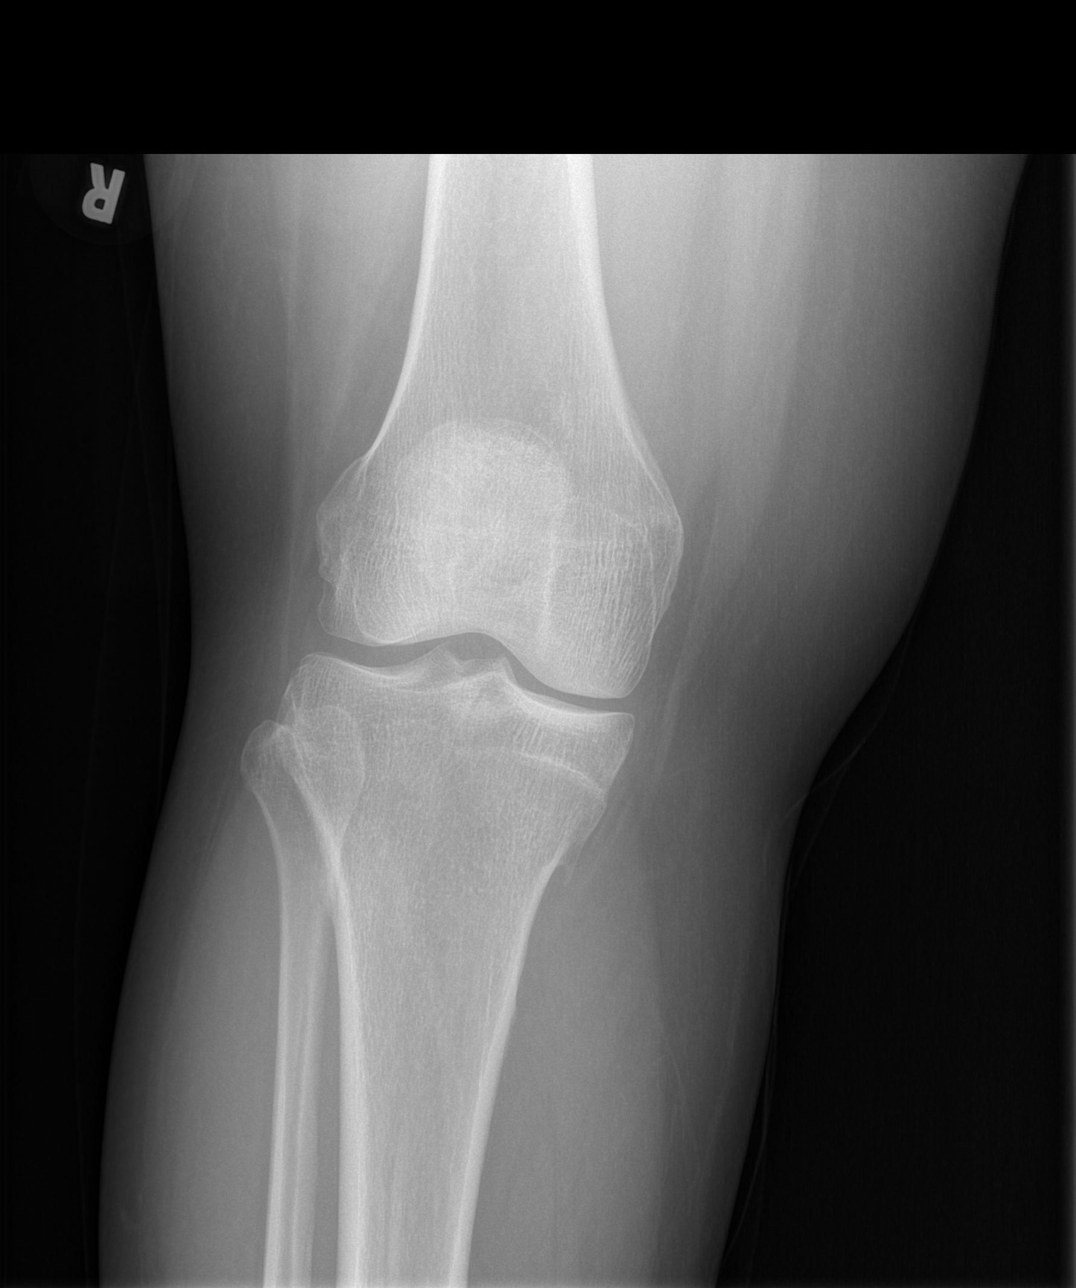

[t knee lat right]
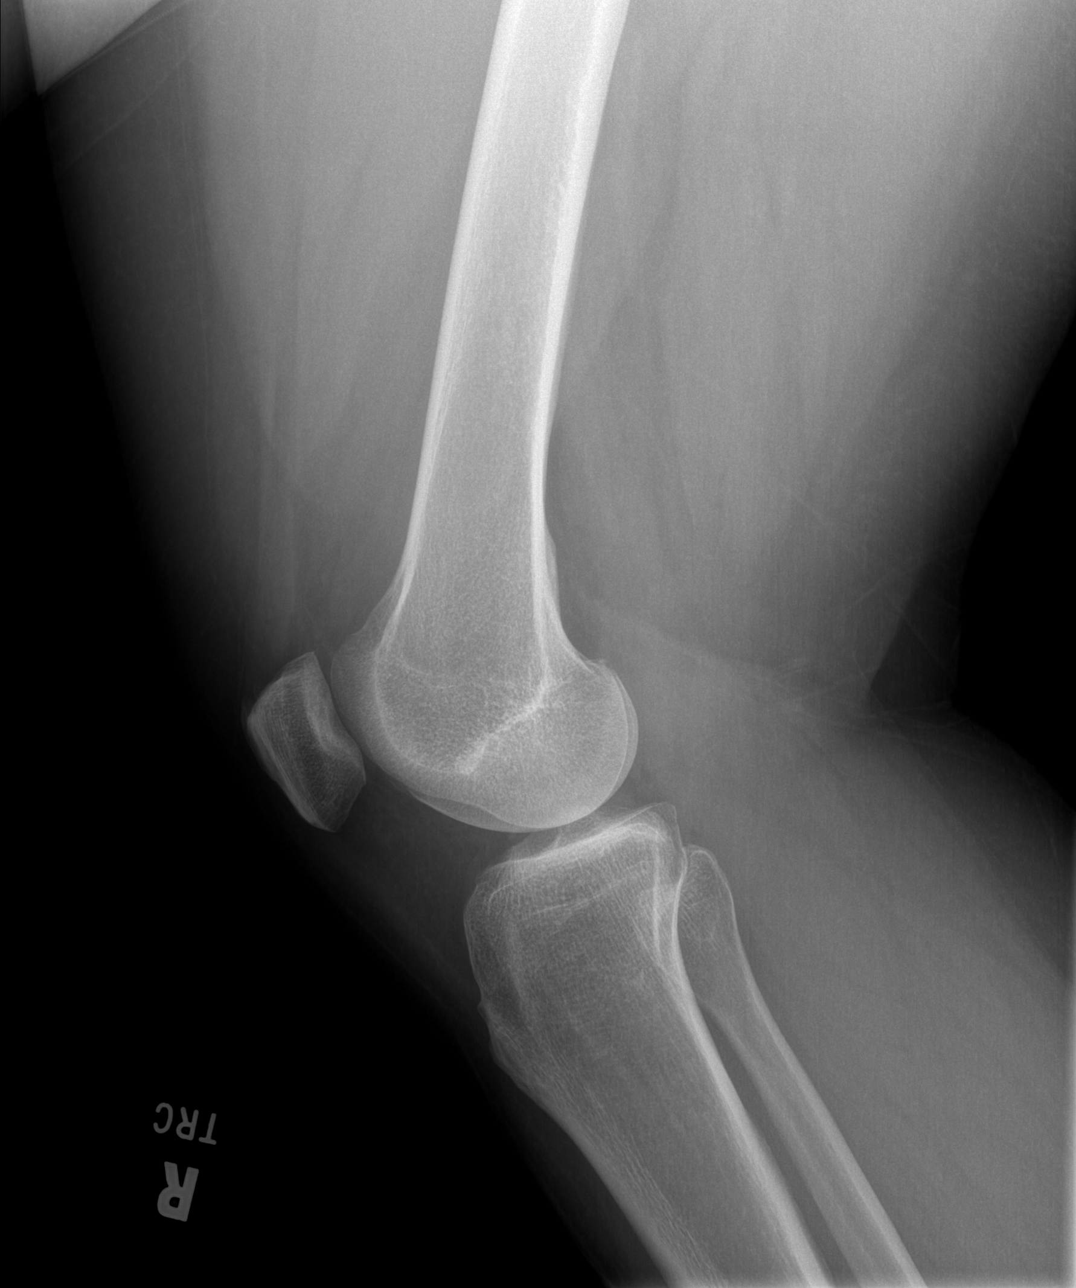

[4 of 4 positions shown; findings below may reference images not displayed]

FINDINGS: No evidence of fracture, dislocation, or joint effusion. Normal
joint spaces and alignment. No evidence of arthropathy or other
focal bone abnormality. Soft tissues are unremarkable.
IMPRESSION: Negative radiographs of the right knee.

## 2023-03-27 LAB — HM COLONOSCOPY

## 2023-03-28 ENCOUNTER — Encounter: Payer: Self-pay | Admitting: Internal Medicine

## 2023-03-28 ENCOUNTER — Ambulatory Visit: Admitting: Internal Medicine

## 2023-03-28 VITALS — BP 138/98 | HR 88 | Temp 99.0°F | Ht 63.8 in | Wt 223.2 lb

## 2023-03-28 DIAGNOSIS — Z6838 Body mass index (BMI) 38.0-38.9, adult: Secondary | ICD-10-CM

## 2023-03-28 DIAGNOSIS — Z6841 Body Mass Index (BMI) 40.0 and over, adult: Secondary | ICD-10-CM | POA: Insufficient documentation

## 2023-03-28 DIAGNOSIS — E1165 Type 2 diabetes mellitus with hyperglycemia: Secondary | ICD-10-CM | POA: Diagnosis not present

## 2023-03-28 DIAGNOSIS — I10 Essential (primary) hypertension: Secondary | ICD-10-CM

## 2023-03-28 DIAGNOSIS — E66812 Obesity, class 2: Secondary | ICD-10-CM | POA: Insufficient documentation

## 2023-03-28 NOTE — Assessment & Plan Note (Addendum)
Chronic, she will c/w Ozempic 2mg  weekly, metformin and Jardiance 25mg  daily. Importance of dietary/medication/exercise compliance was discussed with the patient.

## 2023-03-28 NOTE — Assessment & Plan Note (Signed)
Chronic, uncontrolled. For now, she will c/w amlodipine 5mg , telmisartan/hct 80/25mg  and metoprolol XL 50mg  daily. Reminded to follow a low sodium diet. IF BP is elevated at next visit, I plan to increase dose of amlodipine to 10mg  daily.

## 2023-03-28 NOTE — Progress Notes (Signed)
I,Victoria T Deloria Lair, CMA,acting as a Neurosurgeon for Gwynneth Aliment, MD.,have documented all relevant documentation on the behalf of Gwynneth Aliment, MD,as directed by  Gwynneth Aliment, MD while in the presence of Gwynneth Aliment, MD.  Subjective:  Patient ID: Gabrielle Perkins , female    DOB: 08-30-1976 , 47 y.o.   MRN: [id number]  Chief Complaint  Patient presents with   Diabetes   Hypertension    HPI  Patient presents today for a diabetes and blood pressure check. Gabrielle Perkins is accompanied by her husband today.  Patient reports compliance with her meds. Patient has had a question about her Ozempic Gabrielle Perkins reports the last prescription that was sent in was the 2mg . Gabrielle Perkins needs a refill, and wants clarification regarding the dose Gabrielle Perkins should be taking.       Diabetes Gabrielle Perkins presents for her follow-up diabetic visit. Gabrielle Perkins has type 2 diabetes mellitus. There are no hypoglycemic associated symptoms. There are no diabetic associated symptoms. Pertinent negatives for diabetes include no polydipsia, no polyphagia and no polyuria. There are no hypoglycemic complications. Risk factors for coronary artery disease include diabetes mellitus, dyslipidemia, hypertension, obesity and sedentary lifestyle. Current diabetic treatment includes oral agent (monotherapy). Gabrielle Perkins is compliant with treatment some of the time. Gabrielle Perkins is following a generally healthy diet. Gabrielle Perkins participates in exercise intermittently. Her breakfast blood glucose is taken between 8-9 am. Her breakfast blood glucose range is generally 90-110 mg/dl. (97/-120 blood sugar) Eye exam is current.  Hypertension This is a chronic problem. The current episode started more than 1 year ago. The problem has been gradually improving since onset. The problem is controlled. Past treatments include angiotensin blockers and diuretics. The current treatment provides moderate improvement. Compliance problems include exercise.      Past Medical History:  Diagnosis Date   Diabetes  mellitus without complication (HCC)    GERD (gastroesophageal reflux disease)    Hypertension      Family History  Problem Relation Age of Onset   Hypertension Mother    Diabetes Mother    Hypertension Father    Diabetes Father    Sarcoidosis Father    Hypertension Maternal Grandmother    Coronary artery disease Maternal Grandmother    Hypertension Maternal Grandfather    Breast cancer Neg Hx      Current Outpatient Medications:    acetaminophen (TYLENOL) 500 MG tablet, Take 500 mg by mouth every 6 (six) hours as needed., Disp: , Rfl:    amLODipine (NORVASC) 5 MG tablet, Take 1 tablet (5 mg total) by mouth daily., Disp: 90 tablet, Rfl: 2   Blood Glucose Monitoring Suppl (FREESTYLE FREEDOM LITE) w/Device KIT, Use as directed to check blood sugars 2 times per day dx: e11.22, Disp: 1 kit, Rfl: 1   cetirizine (ZYRTEC) 10 MG tablet, Take 1 tablet (10 mg total) by mouth daily., Disp: 90 tablet, Rfl: 2   empagliflozin (JARDIANCE) 25 MG TABS tablet, Take 1 tablet (25 mg total) by mouth daily before breakfast., Disp: 30 tablet, Rfl: 5   fluticasone (FLONASE) 50 MCG/ACT nasal spray, Place 2 sprays into both nostrils daily., Disp: 16 g, Rfl: 2   glucose blood (FREESTYLE LITE) test strip, Use as instructed to check blood sugars 2 times per day dx: e11.22, Disp: 150 each, Rfl: 2   levonorgestrel (MIRENA, 52 MG,) 20 MCG/DAY IUD, Mirena 20 mcg/24 hours (7 yrs) 52 mg intrauterine device  Take 1 device by intrauterine route., Disp: , Rfl:    metFORMIN (GLUCOPHAGE-XR)  500 MG 24 hr tablet, Take 2 tablets (1,000 mg total) by mouth daily with supper., Disp: 180 tablet, Rfl: 1   metoprolol succinate (TOPROL XL) 50 MG 24 hr tablet, Take 1 tablet (50 mg total) by mouth daily., Disp: 90 tablet, Rfl: 2   Microlet Lancets MISC, Use as directed to check blood sugars 2 times per day dx: e11.22, Disp: 150 each, Rfl: 2   omeprazole (PRILOSEC) 40 MG capsule, Take 1 capsule (40 mg total) by mouth daily., Disp: 90  capsule, Rfl: 2   telmisartan-hydrochlorothiazide (MICARDIS HCT) 80-25 MG tablet, Take 1 tablet by mouth daily., Disp: 90 tablet, Rfl: 2  Current Facility-Administered Medications:    triamcinolone acetonide (KENALOG) 10 MG/ML injection 10 mg, 10 mg, Other, Once, Regal, Kirstie Peri, DPM   No Known Allergies   Review of Systems  Constitutional: Negative.   Respiratory: Negative.    Cardiovascular: Negative.   Gastrointestinal: Negative.   Endocrine: Negative for polydipsia, polyphagia and polyuria.  Musculoskeletal: Negative.   Neurological: Negative.   Psychiatric/Behavioral: Negative.       Today's Vitals   03/28/23 1556 03/28/23 1628  BP: (!) 136/90 (!) 138/98  Pulse: 88   Temp: 99 F (37.2 C)   Weight: 223 lb 3.2 oz (101.2 kg)   Height: 5' 3.8" (1.621 m)   PainSc: 0-No pain    Body mass index is 38.55 kg/m.  Wt Readings from Last 3 Encounters:  03/28/23 223 lb 3.2 oz (101.2 kg)  12/20/22 233 lb 6.4 oz (105.9 kg)  11/03/22 231 lb (104.8 kg)     Objective:  Physical Exam Vitals and nursing note reviewed.  Constitutional:      Appearance: Normal appearance. Gabrielle Perkins is obese.  HENT:     Head: Normocephalic and atraumatic.  Eyes:     Extraocular Movements: Extraocular movements intact.  Cardiovascular:     Rate and Rhythm: Normal rate and regular rhythm.     Heart sounds: Normal heart sounds.  Pulmonary:     Effort: Pulmonary effort is normal.     Breath sounds: Normal breath sounds.  Musculoskeletal:     Cervical back: Normal range of motion.  Skin:    General: Skin is warm.  Neurological:     General: No focal deficit present.     Mental Status: Gabrielle Perkins is alert.  Psychiatric:        Mood and Affect: Mood normal.        Behavior: Behavior normal.         Assessment And Plan:  Uncontrolled type 2 diabetes mellitus with hyperglycemia (HCC) Assessment & Plan: Chronic, Gabrielle Perkins will c/w Ozempic 2mg  weekly, metformin and Jardiance 25mg  daily. Importance of  dietary/medication/exercise compliance was discussed with the patient.   Orders: -     Hemoglobin A1c -     CMP14+EGFR  Essential hypertension, benign Assessment & Plan: Chronic, uncontrolled. For now, Gabrielle Perkins will c/w amlodipine 5mg , telmisartan/hct 80/25mg  and metoprolol XL 50mg  daily. Reminded to follow a low sodium diet. IF BP is elevated at next visit, I plan to increase dose of amlodipine to 10mg  daily.    Class 2 severe obesity due to excess calories with serious comorbidity and body mass index (BMI) of 38.0 to 38.9 in adult [facility]) Assessment & Plan: Gabrielle Perkins was congratulated on her 10lb weight loss and encouraged to keep up the great work.  Gabrielle Perkins is encouraged to aim for at least 150 minutes of exercise/week.       Return in 4 months (on  07/29/2023).  Patient was given opportunity to ask questions. Patient verbalized understanding of the plan and was able to repeat key elements of the plan. All questions were answered to their satisfaction.   I, Gwynneth Aliment, MD, have reviewed all documentation for this visit. The documentation on 03/28/23 for the exam, diagnosis, procedures, and orders are all accurate and complete.   IF YOU HAVE BEEN REFERRED TO A SPECIALIST, IT MAY TAKE 1-2 WEEKS TO SCHEDULE/PROCESS THE REFERRAL. IF YOU HAVE NOT HEARD FROM US/SPECIALIST IN TWO WEEKS, PLEASE GIVE [place] A CALL AT [phone].   THE PATIENT IS ENCOURAGED TO PRACTICE SOCIAL DISTANCING DUE TO THE COVID-19 PANDEMIC.

## 2023-03-28 NOTE — Assessment & Plan Note (Signed)
She was congratulated on her 10lb weight loss and encouraged to keep up the great work.  She is encouraged to aim for at least 150 minutes of exercise/week.

## 2023-03-28 NOTE — Patient Instructions (Signed)

## 2023-03-29 ENCOUNTER — Encounter: Payer: Self-pay | Admitting: Internal Medicine

## 2023-03-29 LAB — HEMOGLOBIN A1C
Est. average glucose Bld gHb Est-mCnc: 171 mg/dL
Hgb A1c MFr Bld: 7.6 % — ABNORMAL HIGH (ref 4.8–5.6)

## 2023-03-29 LAB — CMP14+EGFR
ALT: 29 IU/L (ref 0–32)
AST: 23 IU/L (ref 0–40)
Albumin: 4.2 g/dL (ref 3.9–4.9)
Alkaline Phosphatase: 106 IU/L (ref 44–121)
BUN/Creatinine Ratio: 9 (ref 9–23)
BUN: 7 mg/dL (ref 6–24)
Bilirubin Total: 0.2 mg/dL (ref 0.0–1.2)
CO2: 23 mmol/L (ref 20–29)
Calcium: 9.8 mg/dL (ref 8.7–10.2)
Chloride: 99 mmol/L (ref 96–106)
Creatinine, Ser: 0.8 mg/dL (ref 0.57–1.00)
Globulin, Total: 3.4 g/dL (ref 1.5–4.5)
Glucose: 252 mg/dL — ABNORMAL HIGH (ref 70–99)
Potassium: 3.6 mmol/L (ref 3.5–5.2)
Sodium: 136 mmol/L (ref 134–144)
Total Protein: 7.6 g/dL (ref 6.0–8.5)
eGFR: 93 mL/min/{1.73_m2} (ref >59)

## 2023-07-05 ENCOUNTER — Other Ambulatory Visit: Payer: Self-pay | Admitting: Internal Medicine

## 2023-07-05 DIAGNOSIS — Z1231 Encounter for screening mammogram for malignant neoplasm of breast: Secondary | ICD-10-CM

## 2023-07-14 ENCOUNTER — Ambulatory Visit

## 2023-07-21 ENCOUNTER — Ambulatory Visit
Admission: RE | Admit: 2023-07-21 | Discharge: 2023-07-21 | Disposition: A | Discharge status: Home / Self Care | Source: Ambulatory Visit | Attending: Internal Medicine | Admitting: Internal Medicine

## 2023-07-21 DIAGNOSIS — Z1231 Encounter for screening mammogram for malignant neoplasm of breast: Secondary | ICD-10-CM

## 2023-08-01 ENCOUNTER — Encounter: Payer: Self-pay | Admitting: Hematology

## 2023-08-01 ENCOUNTER — Encounter: Payer: Self-pay | Admitting: Internal Medicine

## 2023-08-01 ENCOUNTER — Other Ambulatory Visit: Payer: Self-pay

## 2023-08-01 ENCOUNTER — Ambulatory Visit: Admitting: Internal Medicine

## 2023-08-01 VITALS — BP 120/82 | HR 80 | Temp 98.4°F | Ht 63.0 in | Wt 227.2 lb

## 2023-08-01 DIAGNOSIS — E1165 Type 2 diabetes mellitus with hyperglycemia: Secondary | ICD-10-CM | POA: Diagnosis not present

## 2023-08-01 DIAGNOSIS — I10 Essential (primary) hypertension: Secondary | ICD-10-CM | POA: Diagnosis not present

## 2023-08-01 DIAGNOSIS — Z23 Encounter for immunization: Secondary | ICD-10-CM

## 2023-08-01 DIAGNOSIS — E66813 Obesity, class 3: Secondary | ICD-10-CM

## 2023-08-01 DIAGNOSIS — Z6841 Body Mass Index (BMI) 40.0 and over, adult: Secondary | ICD-10-CM

## 2023-08-01 MED ORDER — OZEMPIC (2 MG/DOSE) 8 MG/3ML ~~LOC~~ SOPN: PEN_INJECTOR | SUBCUTANEOUS | 3 refills | Status: DC

## 2023-08-01 NOTE — Assessment & Plan Note (Signed)
Chronic, controlled. She will c/w amlodipine 5mg , telmisartan/hct 80/25mg  and metoprolol XL 50mg  daily. Reminded to follow a low sodium diet.  She is reminded to increase her regular exercise regimen.

## 2023-08-01 NOTE — Assessment & Plan Note (Signed)
Chronic, she will c/w Ozempic 2mg  weekly, metformin and Jardiance 25mg  daily. Importance of dietary/medication/exercise compliance was discussed with the patient. She will f/u in four months for re-evaluation.

## 2023-08-01 NOTE — Assessment & Plan Note (Signed)
BMI 40.  She is encouraged to strive to lose ten percent of her body weight, or 22lbs in the next several months. Encouraged to aim for at least 150 minutes of exercise per week.

## 2023-08-01 NOTE — Patient Instructions (Signed)
Hypertension, Adult Hypertension is another name for high blood pressure. High blood pressure forces your heart to work harder to pump blood. This can cause problems over time. There are two numbers in a blood pressure reading. There is a top number (systolic) over a bottom number (diastolic). It is best to have a blood pressure that is below 120/80. What are the causes? The cause of this condition is not known. Some other conditions can lead to high blood pressure. What increases the risk? Some lifestyle factors can make you more likely to develop high blood pressure: Smoking. Not getting enough exercise or physical activity. Being overweight. Having too much fat, sugar, calories, or salt (sodium) in your diet. Drinking too much alcohol. Other risk factors include: Having any of these conditions: Heart disease. Diabetes. High cholesterol. Kidney disease. Obstructive sleep apnea. Having a family history of high blood pressure and high cholesterol. Age. The risk increases with age. Stress. What are the signs or symptoms? High blood pressure may not cause symptoms. Very high blood pressure (hypertensive crisis) may cause: Headache. Fast or uneven heartbeats (palpitations). Shortness of breath. Nosebleed. Vomiting or feeling like you may vomit (nauseous). Changes in how you see. Very bad chest pain. Feeling dizzy. Seizures. How is this treated? This condition is treated by making healthy lifestyle changes, such as: Eating healthy foods. Exercising more. Drinking less alcohol. Your doctor may prescribe medicine if lifestyle changes do not help enough and if: Your top number is above 130. Your bottom number is above 80. Your personal target blood pressure may vary. Follow these instructions at home: Eating and drinking  If told, follow the DASH eating plan. To follow this plan: Fill one half of your plate at each meal with fruits and vegetables. Fill one fourth of your plate  at each meal with whole grains. Whole grains include whole-wheat pasta, brown rice, and whole-grain bread. Eat or drink low-fat dairy products, such as skim milk or low-fat yogurt. Fill one fourth of your plate at each meal with low-fat (lean) proteins. Low-fat proteins include fish, chicken without skin, eggs, beans, and tofu. Avoid fatty meat, cured and processed meat, or chicken with skin. Avoid pre-made or processed food. Limit the amount of salt in your diet to less than 1,500 mg each day. Do not drink alcohol if: Your doctor tells you not to drink. You are pregnant, may be pregnant, or are planning to become pregnant. If you drink alcohol: Limit how much you have to: 0-1 drink a day for women. 0-2 drinks a day for men. Know how much alcohol is in your drink. In the [place] one drink equals one 12 oz bottle of beer (355 mL), one 5 oz glass of wine (148 mL), or one 1 oz glass of hard liquor (44 mL). Lifestyle  Work with your doctor to stay at a healthy weight or to lose weight. Ask your doctor what the best weight is for you. Get at least 30 minutes of exercise that causes your heart to beat faster (aerobic exercise) most days of the week. This may include walking, swimming, or biking. Get at least 30 minutes of exercise that strengthens your muscles (resistance exercise) at least 3 days a week. This may include lifting weights or doing Pilates. Do not smoke or use any products that contain nicotine or tobacco. If you need help quitting, ask your doctor. Check your blood pressure at home as told by your doctor. Keep all follow-up visits. Medicines Take over-the-counter and prescription medicines   only as told by your doctor. Follow directions carefully. Do not skip doses of blood pressure medicine. The medicine does not work as well if you skip doses. Skipping doses also puts you at risk for problems. Ask your doctor about side effects or reactions to medicines that you should watch  for. Contact a doctor if: You think you are having a reaction to the medicine you are taking. You have headaches that keep coming back. You feel dizzy. You have swelling in your ankles. You have trouble with your vision. Get help right away if: You get a very bad headache. You start to feel mixed up (confused). You feel weak or numb. You feel faint. You have very bad pain in your: Chest. Belly (abdomen). You vomit more than once. You have trouble breathing. These symptoms may be an emergency. Get help right away. Call 911. Do not wait to see if the symptoms will go away. Do not drive yourself to the hospital. Summary Hypertension is another name for high blood pressure. High blood pressure forces your heart to work harder to pump blood. For most people, a normal blood pressure is less than 120/80. Making healthy choices can help lower blood pressure. If your blood pressure does not get lower with healthy choices, you may need to take medicine. This information is not intended to replace advice given to you by your health care provider. Make sure you discuss any questions you have with your health care provider. Document Revised: 06/25/2021 Document Reviewed: 06/25/2021 Elsevier Patient Education  2024 Elsevier Inc.  

## 2023-08-01 NOTE — Progress Notes (Signed)
I,Victoria T Deloria Lair, CMA,acting as a Neurosurgeon for Gwynneth Aliment, MD.,have documented all relevant documentation on the behalf of Gwynneth Aliment, MD,as directed by  Gwynneth Aliment, MD while in the presence of Gwynneth Aliment, MD.  Subjective:  Patient ID: Gabrielle Perkins , female    DOB: 12-18-1975 , 47 y.o.   MRN: [id number]  Chief Complaint  Patient presents with   Hypertension   Diabetes    HPI  Patient presents today for a diabetes and blood pressure check. She reports compliance with medications. Denies sob, chest pain, blurred vision.   She denies having any specific questions or concerns.   Diabetes She presents for her follow-up diabetic visit. She has type 2 diabetes mellitus. There are no hypoglycemic associated symptoms. There are no diabetic associated symptoms. Pertinent negatives for diabetes include no polydipsia, no polyphagia and no polyuria. There are no hypoglycemic complications. Risk factors for coronary artery disease include diabetes mellitus, dyslipidemia, hypertension, obesity and sedentary lifestyle. Current diabetic treatment includes oral agent (monotherapy). She is compliant with treatment some of the time. She is following a generally healthy diet. She participates in exercise intermittently. Her breakfast blood glucose is taken between 8-9 am. Her breakfast blood glucose range is generally 90-110 mg/dl. (97/-120 blood sugar) Eye exam is current.  Hypertension This is a chronic problem. The current episode started more than 1 year ago. The problem has been gradually improving since onset. The problem is controlled. Past treatments include angiotensin blockers and diuretics. The current treatment provides moderate improvement. Compliance problems include exercise.      Past Medical History:  Diagnosis Date   Diabetes mellitus without complication (HCC)    GERD (gastroesophageal reflux disease)    Hypertension      Family History  Problem Relation Age of  Onset   Hypertension Mother    Diabetes Mother    Hypertension Father    Diabetes Father    Sarcoidosis Father    Breast cancer Maternal Grandmother    Hypertension Maternal Grandmother    Coronary artery disease Maternal Grandmother    Hypertension Maternal Grandfather      Current Outpatient Medications:    acetaminophen (TYLENOL) 500 MG tablet, Take 500 mg by mouth every 6 (six) hours as needed., Disp: , Rfl:    amLODipine (NORVASC) 5 MG tablet, Take 1 tablet (5 mg total) by mouth daily., Disp: 90 tablet, Rfl: 2   Blood Glucose Monitoring Suppl (FREESTYLE FREEDOM LITE) w/Device KIT, Use as directed to check blood sugars 2 times per day dx: e11.22, Disp: 1 kit, Rfl: 1   cetirizine (ZYRTEC) 10 MG tablet, Take 1 tablet (10 mg total) by mouth daily., Disp: 90 tablet, Rfl: 2   empagliflozin (JARDIANCE) 25 MG TABS tablet, Take 1 tablet (25 mg total) by mouth daily before breakfast., Disp: 30 tablet, Rfl: 5   fluticasone (FLONASE) 50 MCG/ACT nasal spray, Place 2 sprays into both nostrils daily., Disp: 16 g, Rfl: 2   glucose blood (FREESTYLE LITE) test strip, Use as instructed to check blood sugars 2 times per day dx: e11.22, Disp: 150 each, Rfl: 2   levonorgestrel (MIRENA, 52 MG,) 20 MCG/DAY IUD, Mirena 20 mcg/24 hours (7 yrs) 52 mg intrauterine device  Take 1 device by intrauterine route., Disp: , Rfl:    metFORMIN (GLUCOPHAGE-XR) 500 MG 24 hr tablet, Take 2 tablets (1,000 mg total) by mouth daily with supper., Disp: 180 tablet, Rfl: 1   metoprolol succinate (TOPROL XL) 50 MG 24 hr  tablet, Take 1 tablet (50 mg total) by mouth daily., Disp: 90 tablet, Rfl: 2   Microlet Lancets MISC, Use as directed to check blood sugars 2 times per day dx: e11.22, Disp: 150 each, Rfl: 2   omeprazole (PRILOSEC) 40 MG capsule, Take 1 capsule (40 mg total) by mouth daily., Disp: 90 capsule, Rfl: 2   telmisartan-hydrochlorothiazide (MICARDIS HCT) 80-25 MG tablet, Take 1 tablet by mouth daily., Disp: 90 tablet, Rfl:  2   Semaglutide, 2 MG/DOSE, (OZEMPIC, 2 MG/DOSE,) 8 MG/3ML SOPN, INJECT 2MG  ONCE WEEKLY., Disp: 3 mL, Rfl: 3  Current Facility-Administered Medications:    triamcinolone acetonide (KENALOG) 10 MG/ML injection 10 mg, 10 mg, Other, Once, Regal, Norman S, DPM   No Known Allergies   Review of Systems  Constitutional: Negative.   Respiratory: Negative.    Cardiovascular: Negative.   Gastrointestinal: Negative.   Endocrine: Negative for polydipsia, polyphagia and polyuria.  Neurological: Negative.   Psychiatric/Behavioral: Negative.       Today's Vitals   08/01/23 1602  BP: 120/82  Pulse: 80  Temp: 98.4 F (36.9 C)  SpO2: 98%  Weight: 227 lb 3.2 oz (103.1 kg)  Height: 5\' 3"  (1.6 m)   Body mass index is 40.25 kg/m.  Wt Readings from Last 3 Encounters:  08/01/23 227 lb 3.2 oz (103.1 kg)  03/28/23 223 lb 3.2 oz (101.2 kg)  12/20/22 233 lb 6.4 oz (105.9 kg)    BP Readings from Last 3 Encounters:  08/01/23 120/82  03/28/23 (!) 138/98  12/20/22 130/88     Objective:  Physical Exam Vitals and nursing note reviewed.  Constitutional:      Appearance: Normal appearance. She is obese.  HENT:     Head: Normocephalic and atraumatic.  Eyes:     Extraocular Movements: Extraocular movements intact.  Cardiovascular:     Rate and Rhythm: Normal rate and regular rhythm.     Heart sounds: Normal heart sounds.  Pulmonary:     Effort: Pulmonary effort is normal.     Breath sounds: Normal breath sounds.  Musculoskeletal:     Cervical back: Normal range of motion.  Skin:    General: Skin is warm.  Neurological:     General: No focal deficit present.     Mental Status: She is alert.  Psychiatric:        Mood and Affect: Mood normal.        Behavior: Behavior normal.         Assessment And Plan:  Essential hypertension, benign Assessment & Plan: Chronic, controlled. She will c/w amlodipine 5mg , telmisartan/hct 80/25mg  and metoprolol XL 50mg  daily. Reminded to follow a low  sodium diet.  She is reminded to increase her regular exercise regimen.    Orders: -     TSH  Uncontrolled type 2 diabetes mellitus with hyperglycemia (HCC) Assessment & Plan: Chronic, she will c/w Ozempic 2mg  weekly, metformin and Jardiance 25mg  daily. Importance of dietary/medication/exercise compliance was discussed with the patient. She will f/u in four months for re-evaluation.   Orders: -     Hemoglobin A1c -     BMP8+EGFR -     Lipoprotein A (LPA)  Class 3 severe obesity due to excess calories with serious comorbidity and body mass index (BMI) of 40.0 to 44.9 in adult [facility]) Assessment & Plan: BMI 40.  She is encouraged to strive to lose ten percent of her body weight, or 22lbs in the next several months. Encouraged to aim for at least 150  minutes of exercise per week.    Immunization due -     Flu vaccine trivalent PF, 6mos and older(Flulaval,Afluria,Fluarix,Fluzone)  She is encouraged to strive for BMI less than 30 to decrease cardiac risk. Advised to aim for at least 150 minutes of exercise per week.    Return if symptoms worsen or fail to improve.  Patient was given opportunity to ask questions. Patient verbalized understanding of the plan and was able to repeat key elements of the plan. All questions were answered to their satisfaction.    I, Gwynneth Aliment, MD, have reviewed all documentation for this visit. The documentation on 08/01/23 for the exam, diagnosis, procedures, and orders are all accurate and complete.   IF YOU HAVE BEEN REFERRED TO A SPECIALIST, IT MAY TAKE 1-2 WEEKS TO SCHEDULE/PROCESS THE REFERRAL. IF YOU HAVE NOT HEARD FROM US/SPECIALIST IN TWO WEEKS, PLEASE GIVE [place] A CALL AT [phone].   THE PATIENT IS ENCOURAGED TO PRACTICE SOCIAL DISTANCING DUE TO THE COVID-19 PANDEMIC.

## 2023-08-02 LAB — BMP8+EGFR
BUN/Creatinine Ratio: 12 (ref 9–23)
BUN: 9 mg/dL (ref 6–24)
CO2: 23 mmol/L (ref 20–29)
Calcium: 9.1 mg/dL (ref 8.7–10.2)
Chloride: 101 mmol/L (ref 96–106)
Creatinine, Ser: 0.78 mg/dL (ref 0.57–1.00)
Glucose: 162 mg/dL — ABNORMAL HIGH (ref 70–99)
Potassium: 3.9 mmol/L (ref 3.5–5.2)
Sodium: 136 mmol/L (ref 134–144)
eGFR: 95 mL/min/{1.73_m2} (ref >59)

## 2023-08-02 LAB — HEMOGLOBIN A1C
Est. average glucose Bld gHb Est-mCnc: 163 mg/dL
Hgb A1c MFr Bld: 7.3 % — ABNORMAL HIGH (ref 4.8–5.6)

## 2023-08-02 LAB — TSH: TSH: 0.83 u[IU]/mL (ref 0.450–4.500)

## 2023-08-02 LAB — LIPOPROTEIN A (LPA): Lipoprotein (a): 121.6 nmol/L — ABNORMAL HIGH (ref <75.0)

## 2023-09-27 ENCOUNTER — Encounter: Payer: Self-pay | Admitting: Hematology

## 2023-09-28 ENCOUNTER — Encounter: Payer: Self-pay | Admitting: Family Medicine

## 2023-09-28 ENCOUNTER — Telehealth (INDEPENDENT_AMBULATORY_CARE_PROVIDER_SITE_OTHER): Admitting: Family Medicine

## 2023-09-28 DIAGNOSIS — J069 Acute upper respiratory infection, unspecified: Secondary | ICD-10-CM | POA: Diagnosis not present

## 2023-09-28 MED ORDER — BENZONATATE 100 MG PO CAPS: 100.0000 mg | ORAL_CAPSULE | Freq: Three times a day (TID) | ORAL | 0 refills | Status: DC | PRN

## 2023-09-28 MED ORDER — AZITHROMYCIN 250 MG PO TABS: ORAL_TABLET | ORAL | 0 refills | Status: AC

## 2023-09-28 NOTE — Progress Notes (Signed)
 Virtual Visit via Video Note  I,Gabrielle Perkins, CMA,acting as a scribe for [employer], NP.,have documented all relevant documentation on the behalf of Gabrielle Creighton, NP,as directed by  Gabrielle Creighton, NP while in the presence of Gabrielle Creighton, NP.  I connected with Gabrielle Perkins on 10/02/23 at  4:20 PM EST by a video enabled telemedicine application and verified that I am speaking with the correct person using two identifiers.  Patient Location: Home Provider Location: Office/Clinic  I discussed the limitations, risks, security, and privacy concerns of performing an evaluation and management service by video and the availability of in person appointments. I also discussed with the patient that there may be a patient responsible charge related to this service. The patient expressed understanding and agreed to proceed.  Subjective: PCP: Gabrielle Medici, MD  Chief Complaint  Patient presents with   URI   Patient is a 48 year old female getting a virtual appointment with complaints of URI symptoms  that started one week ago on Tuesday. She reports she has a lot of mucus and postnasal drainage which is yellow greenish in color. Patient states she has used sudafed and zyrtec  without any relief. She denies any fever or bodyaches.     ROS: Per HPI  Current Outpatient Medications:    acetaminophen  (TYLENOL ) 500 MG tablet, Take 500 mg by mouth every 6 (six) hours as needed., Disp: , Rfl:    amLODipine  (NORVASC ) 5 MG tablet, Take 1 tablet (5 mg total) by mouth daily., Disp: 90 tablet, Rfl: 2   azithromycin  (ZITHROMAX ) 250 MG tablet, Take 2 tablets (500 mg) on  Day 1,  followed by 1 tablet (250 mg) once daily on Days 2 through 5., Disp: 6 each, Rfl: 0   benzonatate  (TESSALON  PERLES) 100 MG capsule, Take 1 capsule (100 mg total) by mouth 3 (three) times daily as needed., Disp: 30 capsule, Rfl: 0   Blood Glucose Monitoring Suppl (FREESTYLE FREEDOM LITE) w/Device KIT, Use as directed to check  blood sugars 2 times per day dx: e11.22, Disp: 1 kit, Rfl: 1   cetirizine  (ZYRTEC ) 10 MG tablet, Take 1 tablet (10 mg total) by mouth daily., Disp: 90 tablet, Rfl: 2   empagliflozin  (JARDIANCE ) 25 MG TABS tablet, Take 1 tablet (25 mg total) by mouth daily before breakfast., Disp: 30 tablet, Rfl: 5   fluticasone  (FLONASE ) 50 MCG/ACT nasal spray, Place 2 sprays into both nostrils daily., Disp: 16 g, Rfl: 2   levonorgestrel (MIRENA, 52 MG,) 20 MCG/DAY IUD, Mirena 20 mcg/24 hours (7 yrs) 52 mg intrauterine device  Take 1 device by intrauterine route., Disp: , Rfl:    metFORMIN  (GLUCOPHAGE -XR) 500 MG 24 hr tablet, Take 2 tablets (1,000 mg total) by mouth daily with supper., Disp: 180 tablet, Rfl: 1   metoprolol  succinate (TOPROL  XL) 50 MG 24 hr tablet, Take 1 tablet (50 mg total) by mouth daily., Disp: 90 tablet, Rfl: 2   Microlet Lancets MISC, Use as directed to check blood sugars 2 times per day dx: e11.22, Disp: 150 each, Rfl: 2   omeprazole  (PRILOSEC) 40 MG capsule, Take 1 capsule (40 mg total) by mouth daily., Disp: 90 capsule, Rfl: 2   Semaglutide , 2 MG/DOSE, (OZEMPIC , 2 MG/DOSE,) 8 MG/3ML SOPN, INJECT 2MG  ONCE WEEKLY., Disp: 3 mL, Rfl: 3   telmisartan -hydrochlorothiazide  (MICARDIS  HCT) 80-25 MG tablet, Take 1 tablet by mouth daily., Disp: 90 tablet, Rfl: 2   glucose blood (FREESTYLE LITE) test strip, Use as instructed to check blood  sugars 2 times per day dx: e11.22, Disp: 150 each, Rfl: 2  Current Facility-Administered Medications:    triamcinolone  acetonide (KENALOG ) 10 MG/ML injection 10 mg, 10 mg, Other, Once, Regal, [place] RAMAN, DPM  Observations/Objective: Today's Vitals  Patient could not get her vitals at home. Physical Exam Vitals reviewed: no VS taken; this is a virtual appt.  Neurological:     Mental Status: She is alert.     Assessment and Plan: URI with cough and congestion -     Azithromycin ; Take 2 tablets (500 mg) on  Day 1,  followed by 1 tablet (250 mg) once daily on Days  2 through 5.  Dispense: 6 each; Refill: 0 -     Benzonatate ; Take 1 capsule (100 mg total) by mouth 3 (three) times daily as needed.  Dispense: 30 capsule; Refill: 0    Follow Up Instructions: Return if symptoms worsen or fail to improve, for Keep scheduled appt for next month..   I discussed the assessment and treatment plan with the patient. The patient was provided an opportunity to ask questions, and all were answered. The patient agreed with the plan and demonstrated an understanding of the instructions.   The patient was advised to call back or seek an in-person evaluation if the symptoms worsen or if the condition fails to improve as anticipated.  The above assessment and management plan was discussed with the patient. The patient verbalized understanding of and has agreed to the management plan.   I, Gabrielle Creighton, NP, have reviewed all documentation for this visit. The documentation on 09/30/2023 for the exam, diagnosis, procedures, and orders are all accurate and complete.

## 2023-10-02 DIAGNOSIS — J069 Acute upper respiratory infection, unspecified: Secondary | ICD-10-CM | POA: Insufficient documentation

## 2023-11-16 ENCOUNTER — Ambulatory Visit (INDEPENDENT_AMBULATORY_CARE_PROVIDER_SITE_OTHER): Admitting: Internal Medicine

## 2023-11-16 ENCOUNTER — Encounter: Payer: Self-pay | Admitting: Internal Medicine

## 2023-11-16 ENCOUNTER — Other Ambulatory Visit: Payer: Self-pay

## 2023-11-16 VITALS — BP 118/78 | HR 92 | Temp 97.5°F | Ht 63.0 in | Wt 223.0 lb

## 2023-11-16 DIAGNOSIS — Z0000 Encounter for general adult medical examination without abnormal findings: Secondary | ICD-10-CM

## 2023-11-16 DIAGNOSIS — I10 Essential (primary) hypertension: Secondary | ICD-10-CM | POA: Diagnosis not present

## 2023-11-16 DIAGNOSIS — E1169 Type 2 diabetes mellitus with other specified complication: Secondary | ICD-10-CM

## 2023-11-16 DIAGNOSIS — Z6839 Body mass index (BMI) 39.0-39.9, adult: Secondary | ICD-10-CM

## 2023-11-16 DIAGNOSIS — E66812 Obesity, class 2: Secondary | ICD-10-CM

## 2023-11-16 DIAGNOSIS — Z23 Encounter for immunization: Secondary | ICD-10-CM

## 2023-11-16 DIAGNOSIS — E785 Hyperlipidemia, unspecified: Secondary | ICD-10-CM | POA: Diagnosis not present

## 2023-11-16 DIAGNOSIS — E1165 Type 2 diabetes mellitus with hyperglycemia: Secondary | ICD-10-CM

## 2023-11-16 DIAGNOSIS — R5383 Other fatigue: Secondary | ICD-10-CM | POA: Diagnosis not present

## 2023-11-16 LAB — POCT URINALYSIS DIPSTICK
Bilirubin, UA: NEGATIVE
Blood, UA: NEGATIVE
Glucose, UA: POSITIVE — AB
Ketones, UA: NEGATIVE
Leukocytes, UA: NEGATIVE
Nitrite, UA: NEGATIVE
Protein, UA: NEGATIVE
Spec Grav, UA: 1.025 (ref 1.010–1.025)
Urobilinogen, UA: 0.2 U/dL
pH, UA: 5.5 (ref 5.0–8.0)

## 2023-11-16 MED ORDER — METFORMIN HCL ER 500 MG PO TB24: 1000.0000 mg | ORAL_TABLET | Freq: Every day | ORAL | 1 refills | Status: DC

## 2023-11-16 MED ORDER — OZEMPIC (2 MG/DOSE) 8 MG/3ML ~~LOC~~ SOPN: PEN_INJECTOR | SUBCUTANEOUS | 3 refills | Status: DC

## 2023-11-16 MED ORDER — TELMISARTAN-HCTZ 80-25 MG PO TABS: 1.0000 | ORAL_TABLET | Freq: Every day | ORAL | 2 refills | Status: AC

## 2023-11-16 MED ORDER — AMLODIPINE BESYLATE 5 MG PO TABS: 5.0000 mg | ORAL_TABLET | Freq: Every day | ORAL | 2 refills | Status: AC

## 2023-11-16 MED ORDER — EMPAGLIFLOZIN 25 MG PO TABS: 25.0000 mg | ORAL_TABLET | Freq: Every day | ORAL | 2 refills | Status: DC

## 2023-11-16 MED ORDER — METOPROLOL SUCCINATE ER 50 MG PO TB24: 50.0000 mg | ORAL_TABLET | Freq: Every day | ORAL | 2 refills | Status: AC

## 2023-11-16 NOTE — Assessment & Plan Note (Signed)
 BMI 39.  She is encouraged to strive to lose ten percent of her body weight, or 22lbs in the next several months. Encouraged to aim for at least 150 minutes of exercise per week.

## 2023-11-16 NOTE — Assessment & Plan Note (Signed)

## 2023-11-16 NOTE — Assessment & Plan Note (Addendum)
 Chronic, controlled. 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 She will c/w amlodipine 5mg , telmisartan/hct 80/25mg  and metoprolol XL 50mg  daily. Reminded to follow a low sodium diet.  She is reminded to increase her regular exercise regimen.

## 2023-11-16 NOTE — Patient Instructions (Signed)

## 2023-11-16 NOTE — Assessment & Plan Note (Addendum)
 Chronic, she will c/w Ozempic 2mg  weekly, metformin and Jardiance 25mg  daily. Importance of dietary/medication/exercise compliance was discussed with the patient. She will f/u in four months for re-evaluation. I DISCUSSED WITH THE PATIENT AT LENGTH REGARDING THE GOALS OF GLYCEMIC CONTROL AND POSSIBLE LONG-TERM COMPLICATIONS.  I  ALSO STRESSED THE IMPORTANCE OF COMPLIANCE WITH HOME GLUCOSE MONITORING, DIETARY RESTRICTIONS INCLUDING AVOIDANCE OF SUGARY DRINKS/PROCESSED FOODS,  ALONG WITH REGULAR EXERCISE.  I  ALSO STRESSED THE IMPORTANCE OF ANNUAL EYE EXAMS, SELF FOOT CARE AND COMPLIANCE WITH OFFICE VISITS.

## 2023-11-16 NOTE — Progress Notes (Signed)
 I,Victoria T Deloria Lair, CMA,acting as a Neurosurgeon for Gwynneth Aliment, MD.,have documented all relevant documentation on the behalf of Gwynneth Aliment, MD,as directed by  Gwynneth Aliment, MD while in the presence of Gwynneth Aliment, MD.  Subjective:    Patient ID: Gabrielle Perkins , female    DOB: 01/02/1976 , 47 y.o.   MRN: [id number]  Chief Complaint  Patient presents with   Annual Exam   Hypertension   Diabetes    HPI  She is here today for a full physical examination. She is followed by Dr. Estanislado Pandy for her pelvic exams.  She denies having any headaches, chest pain and shortness of breath.   She reports feeling fatigued & having no energy.    Diabetes She presents for her follow-up diabetic visit. She has type 2 diabetes mellitus. There are no hypoglycemic associated symptoms. Associated symptoms include fatigue. There are no hypoglycemic complications. Risk factors for coronary artery disease include diabetes mellitus, dyslipidemia, hypertension, obesity and sedentary lifestyle. She participates in exercise intermittently. Eye exam is current.  Hypertension This is a chronic problem. The current episode started more than 1 year ago. The problem has been gradually improving since onset. The problem is controlled. Risk factors for coronary artery disease include diabetes mellitus, obesity and sedentary lifestyle. Past treatments include angiotensin blockers and diuretics. The current treatment provides moderate improvement. Compliance problems include exercise.      Past Medical History:  Diagnosis Date   Diabetes mellitus without complication (HCC)    GERD (gastroesophageal reflux disease)    Hypertension      Family History  Problem Relation Age of Onset   Hypertension Mother    Diabetes Mother    Hypertension Father    Diabetes Father    Sarcoidosis Father    Breast cancer Maternal Grandmother    Hypertension Maternal Grandmother    Coronary artery disease Maternal Grandmother     Hypertension Maternal Grandfather      Current Outpatient Medications:    acetaminophen (TYLENOL) 500 MG tablet, Take 500 mg by mouth every 6 (six) hours as needed., Disp: , Rfl:    benzonatate (TESSALON PERLES) 100 MG capsule, Take 1 capsule (100 mg total) by mouth 3 (three) times daily as needed., Disp: 30 capsule, Rfl: 0   Blood Glucose Monitoring Suppl (FREESTYLE FREEDOM LITE) w/Device KIT, Use as directed to check blood sugars 2 times per day dx: e11.22, Disp: 1 kit, Rfl: 1   cetirizine (ZYRTEC) 10 MG tablet, Take 1 tablet (10 mg total) by mouth daily., Disp: 90 tablet, Rfl: 2   fluticasone (FLONASE) 50 MCG/ACT nasal spray, Place 2 sprays into both nostrils daily., Disp: 16 g, Rfl: 2   glucose blood (FREESTYLE LITE) test strip, Use as instructed to check blood sugars 2 times per day dx: e11.22, Disp: 150 each, Rfl: 2   levonorgestrel (MIRENA, 52 MG,) 20 MCG/DAY IUD, Mirena 20 mcg/24 hours (7 yrs) 52 mg intrauterine device  Take 1 device by intrauterine route., Disp: , Rfl:    Microlet Lancets MISC, Use as directed to check blood sugars 2 times per day dx: e11.22, Disp: 150 each, Rfl: 2   omeprazole (PRILOSEC) 40 MG capsule, Take 1 capsule (40 mg total) by mouth daily., Disp: 90 capsule, Rfl: 2   amLODipine (NORVASC) 5 MG tablet, Take 1 tablet (5 mg total) by mouth daily., Disp: 90 tablet, Rfl: 2   empagliflozin (JARDIANCE) 25 MG TABS tablet, Take 1 tablet (25 mg total) by mouth  daily before breakfast., Disp: 90 tablet, Rfl: 2   metFORMIN (GLUCOPHAGE-XR) 500 MG 24 hr tablet, Take 2 tablets (1,000 mg total) by mouth daily with supper., Disp: 180 tablet, Rfl: 1   metoprolol succinate (TOPROL XL) 50 MG 24 hr tablet, Take 1 tablet (50 mg total) by mouth daily., Disp: 90 tablet, Rfl: 2   Semaglutide, 2 MG/DOSE, (OZEMPIC, 2 MG/DOSE,) 8 MG/3ML SOPN, INJECT 2MG  ONCE WEEKLY., Disp: 3 mL, Rfl: 3   telmisartan-hydrochlorothiazide (MICARDIS HCT) 80-25 MG tablet, Take 1 tablet by mouth daily., Disp: 90  tablet, Rfl: 2  Current Facility-Administered Medications:    triamcinolone acetonide (KENALOG) 10 MG/ML injection 10 mg, 10 mg, Other, Once, Regal, Kirstie Peri, DPM   No Known Allergies    The patient states she uses IUD for birth control. No LMP recorded. (Menstrual status: IUD).. Negative for Dysmenorrhea. Negative for: breast discharge, breast lump(s), breast pain and breast self exam. Associated symptoms include abnormal vaginal bleeding. Pertinent negatives include abnormal bleeding (hematology), anxiety, decreased libido, depression, difficulty falling sleep, dyspareunia, history of infertility, nocturia, sexual dysfunction, sleep disturbances, urinary incontinence, urinary urgency, vaginal discharge and vaginal itching. Diet regular.The patient states her exercise level is  twice weekly.   . The patient's tobacco use is:  Social History   Tobacco Use  Smoking Status Never  Smokeless Tobacco Never  . She has been exposed to passive smoke. The patient's alcohol use is:  Social History   Substance and Sexual Activity  Alcohol Use No   Review of Systems  Constitutional:  Positive for fatigue.  HENT: Negative.    Eyes: Negative.   Respiratory: Negative.    Cardiovascular: Negative.   Gastrointestinal: Negative.   Endocrine: Negative.   Genitourinary: Negative.   Musculoskeletal: Negative.   Skin: Negative.   Allergic/Immunologic: Negative.   Neurological: Negative.   Hematological: Negative.   Psychiatric/Behavioral: Negative.       Today's Vitals   11/16/23 1010  BP: 118/78  Pulse: 92  Temp: (!) 97.5 F (36.4 C)  SpO2: 98%  Weight: 223 lb (101.2 kg)  Height: 5\' 3"  (1.6 m)   Body mass index is 39.5 kg/m.  Wt Readings from Last 3 Encounters:  11/16/23 223 lb (101.2 kg)  08/01/23 227 lb 3.2 oz (103.1 kg)  03/28/23 223 lb 3.2 oz (101.2 kg)     Objective:  Physical Exam Vitals and nursing note reviewed.  Constitutional:      Appearance: Normal appearance.   HENT:     Head: Normocephalic and atraumatic.     Right Ear: Tympanic membrane, ear canal and external ear normal.     Left Ear: Tympanic membrane, ear canal and external ear normal.     Nose:     Comments: Masked     Mouth/Throat:     Comments: Masked  Eyes:     Extraocular Movements: Extraocular movements intact.     Conjunctiva/sclera: Conjunctivae normal.     Pupils: Pupils are equal, round, and reactive to light.  Cardiovascular:     Rate and Rhythm: Normal rate and regular rhythm.     Pulses: Normal pulses.          Dorsalis pedis pulses are 2+ on the right side and 2+ on the left side.     Heart sounds: Normal heart sounds.  Pulmonary:     Effort: Pulmonary effort is normal.     Breath sounds: Normal breath sounds.  Chest:  Breasts:    Tanner Score is 5.  Right: Normal.     Left: Normal.  Abdominal:     General: Bowel sounds are normal.     Palpations: Abdomen is soft.     Comments: Obese, soft  Genitourinary:    Comments: deferred Musculoskeletal:        General: Normal range of motion.     Cervical back: Normal range of motion and neck supple.  Feet:     Right foot:     Protective Sensation: 5 sites tested.  5 sites sensed.     Skin integrity: Dry skin present.     Toenail Condition: Right toenails are normal.     Left foot:     Protective Sensation: 5 sites tested.  5 sites sensed.     Skin integrity: Dry skin present.     Toenail Condition: Left toenails are normal.  Skin:    General: Skin is warm and dry.  Neurological:     General: No focal deficit present.     Mental Status: She is alert and oriented to person, place, and time.  Psychiatric:        Mood and Affect: Mood normal.        Behavior: Behavior normal.         Assessment And Plan:     Encounter for general adult medical examination w/o abnormal findings Assessment & Plan: A full exam was performed.  Importance of monthly self breast exams was discussed with the patient.  She is  advised to get 30-45 minutes of regular exercise, no less than four to five days per week. Both weight-bearing and aerobic exercises are recommended.  She is advised to follow a healthy diet with at least six fruits/veggies per day, decrease intake of red meat and other saturated fats and to increase fish intake to twice weekly.  Meats/fish should not be fried -- baked, boiled or broiled is preferable. It is also important to cut back on your sugar intake.  Be sure to read labels - try to avoid anything with added sugar, high fructose corn syrup or other sweeteners.  If you must use a sweetener, you can try stevia or monkfruit.  It is also important to avoid artificially sweetened foods/beverages and diet drinks. Lastly, wear SPF 50 sunscreen on exposed skin and when in direct sunlight for an extended period of time.  Be sure to avoid fast food restaurants and aim for at least 60 ounces of water daily.       Essential hypertension, benign Assessment & Plan: Chronic, controlled. 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 She will c/w amlodipine 5mg , telmisartan/hct 80/25mg  and metoprolol XL 50mg  daily. Reminded to follow a low sodium diet.  She is reminded to increase her regular exercise regimen.    Orders: -     POCT urinalysis dipstick -     Microalbumin / creatinine urine ratio -     CMP14+EGFR -     Lipid panel -     EKG 12-Lead  Dyslipidemia associated with type 2 diabetes mellitus (HCC) Assessment & Plan: Chronic, she will c/w Ozempic 2mg  weekly, metformin and Jardiance 25mg  daily. Importance of dietary/medication/exercise compliance was discussed with the patient. She will f/u in four months for re-evaluation. I DISCUSSED WITH THE PATIENT AT LENGTH REGARDING THE GOALS OF GLYCEMIC CONTROL AND POSSIBLE LONG-TERM COMPLICATIONS.  I  ALSO STRESSED THE IMPORTANCE OF COMPLIANCE WITH HOME GLUCOSE MONITORING, DIETARY RESTRICTIONS INCLUDING AVOIDANCE OF SUGARY DRINKS/PROCESSED FOODS,  ALONG WITH  REGULAR EXERCISE.  I  ALSO STRESSED THE IMPORTANCE OF ANNUAL EYE EXAMS, SELF FOOT CARE AND COMPLIANCE WITH OFFICE VISITS.   Orders: -     CBC -     CMP14+EGFR -     Lipid panel -     Hemoglobin A1c  Fatigue, unspecified type -     Vitamin B12  Class 2 severe obesity due to excess calories with serious comorbidity and body mass index (BMI) of 39.0 to 39.9 in adult [facility]) Assessment & Plan: BMI 39.  She is encouraged to strive to lose ten percent of her body weight, or 22lbs in the next several months. Encouraged to aim for at least 150 minutes of exercise per week.    Immunization due -     Pneumococcal conjugate vaccine 20-valent  Other orders -     amLODIPine Besylate; Take 1 tablet (5 mg total) by mouth daily.  Dispense: 90 tablet; Refill: 2 -     metFORMIN HCl ER; Take 2 tablets (1,000 mg total) by mouth daily with supper.  Dispense: 180 tablet; Refill: 1 -     Metoprolol Succinate ER; Take 1 tablet (50 mg total) by mouth daily.  Dispense: 90 tablet; Refill: 2 -     Telmisartan-HCTZ; Take 1 tablet by mouth daily.  Dispense: 90 tablet; Refill: 2 -     Empagliflozin; Take 1 tablet (25 mg total) by mouth daily before breakfast.  Dispense: 90 tablet; Refill: 2  She is encouraged to strive for BMI less than 30 to decrease cardiac risk. Advised to aim for at least 150 minutes of exercise per week.    Return for 1 year HM, 4 month dm. Patient was given opportunity to ask questions. Patient verbalized understanding of the plan and was able to repeat key elements of the plan. All questions were answered to their satisfaction.    I, Gwynneth Aliment, MD, have reviewed all documentation for this visit. The documentation on 11/16/23 for the exam, diagnosis, procedures, and orders are all accurate and complete.

## 2023-11-17 LAB — CMP14+EGFR
ALT: 18 IU/L (ref 0–32)
AST: 16 IU/L (ref 0–40)
Albumin: 4.3 g/dL (ref 3.9–4.9)
Alkaline Phosphatase: 104 IU/L (ref 44–121)
BUN/Creatinine Ratio: 10 (ref 9–23)
BUN: 8 mg/dL (ref 6–24)
Bilirubin Total: 0.4 mg/dL (ref 0.0–1.2)
CO2: 23 mmol/L (ref 20–29)
Calcium: 9.5 mg/dL (ref 8.7–10.2)
Chloride: 100 mmol/L (ref 96–106)
Creatinine, Ser: 0.82 mg/dL (ref 0.57–1.00)
Globulin, Total: 3.2 g/dL (ref 1.5–4.5)
Glucose: 108 mg/dL — ABNORMAL HIGH (ref 70–99)
Potassium: 3.8 mmol/L (ref 3.5–5.2)
Sodium: 140 mmol/L (ref 134–144)
Total Protein: 7.5 g/dL (ref 6.0–8.5)
eGFR: 89 mL/min/{1.73_m2} (ref >59)

## 2023-11-17 LAB — MICROALBUMIN / CREATININE URINE RATIO
Creatinine, Urine: 108.8 mg/dL
Microalb/Creat Ratio: 6 mg/g{creat} (ref 0–29)
Microalbumin, Urine: 6.7 ug/mL

## 2023-11-17 LAB — CBC
Hematocrit: 39 % (ref 34.0–46.6)
Hemoglobin: 12.2 g/dL (ref 11.1–15.9)
MCH: 26.6 pg (ref 26.6–33.0)
MCHC: 31.3 g/dL — ABNORMAL LOW (ref 31.5–35.7)
MCV: 85 fL (ref 79–97)
Platelets: 480 10*3/uL — ABNORMAL HIGH (ref 150–450)
RBC: 4.58 x10E6/uL (ref 3.77–5.28)
RDW: 14.8 % (ref 11.7–15.4)
WBC: 6.5 10*3/uL (ref 3.4–10.8)

## 2023-11-17 LAB — LIPID PANEL
Chol/HDL Ratio: 3.3 ratio (ref 0.0–4.4)
Cholesterol, Total: 114 mg/dL (ref 100–199)
HDL: 35 mg/dL — ABNORMAL LOW (ref >39)
LDL Chol Calc (NIH): 63 mg/dL (ref 0–99)
Triglycerides: 78 mg/dL (ref 0–149)
VLDL Cholesterol Cal: 16 mg/dL (ref 5–40)

## 2023-11-17 LAB — HEMOGLOBIN A1C
Est. average glucose Bld gHb Est-mCnc: 146 mg/dL
Hgb A1c MFr Bld: 6.7 % — ABNORMAL HIGH (ref 4.8–5.6)

## 2023-11-17 LAB — VITAMIN B12: Vitamin B-12: 362 pg/mL (ref 232–1245)

## 2023-11-18 ENCOUNTER — Other Ambulatory Visit: Payer: Self-pay

## 2023-11-23 LAB — SPECIMEN STATUS REPORT

## 2023-11-23 LAB — IRON AND TIBC
Iron Saturation: 11 % — ABNORMAL LOW (ref 15–55)
Iron: 45 ug/dL (ref 27–159)
Total Iron Binding Capacity: 411 ug/dL (ref 250–450)
UIBC: 366 ug/dL (ref 131–425)

## 2023-11-23 LAB — FERRITIN: Ferritin: 19 ng/mL (ref 15–150)

## 2024-02-12 ENCOUNTER — Ambulatory Visit
Admission: RE | Admit: 2024-02-12 | Discharge: 2024-02-12 | Disposition: A | Discharge status: Home / Self Care | Source: Ambulatory Visit | Attending: Internal Medicine

## 2024-02-12 ENCOUNTER — Encounter: Payer: Self-pay | Admitting: Hematology

## 2024-02-12 ENCOUNTER — Ambulatory Visit (INDEPENDENT_AMBULATORY_CARE_PROVIDER_SITE_OTHER): Admitting: Radiology

## 2024-02-12 VITALS — BP 141/97 | HR 119 | Temp 99.0°F | Resp 18

## 2024-02-12 DIAGNOSIS — R051 Acute cough: Secondary | ICD-10-CM | POA: Diagnosis not present

## 2024-02-12 DIAGNOSIS — R0602 Shortness of breath: Secondary | ICD-10-CM | POA: Diagnosis not present

## 2024-02-12 DIAGNOSIS — J189 Pneumonia, unspecified organism: Secondary | ICD-10-CM

## 2024-02-12 LAB — POC COVID19/FLU A&B COMBO
Covid Antigen, POC: NEGATIVE
Influenza A Antigen, POC: NEGATIVE
Influenza B Antigen, POC: NEGATIVE

## 2024-02-12 MED ORDER — AMOXICILLIN-POT CLAVULANATE 875-125 MG PO TABS: 1.0000 | ORAL_TABLET | Freq: Two times a day (BID) | ORAL | 0 refills | Status: DC

## 2024-02-12 MED ORDER — AZITHROMYCIN 250 MG PO TABS: 250.0000 mg | ORAL_TABLET | Freq: Every day | ORAL | 0 refills | Status: DC

## 2024-02-12 MED ORDER — ACETAMINOPHEN 325 MG PO TABS
975.0000 mg | ORAL_TABLET | Freq: Once | ORAL | Status: AC
  Administered 2024-02-12: 975 mg via ORAL

## 2024-02-12 MED ORDER — ALBUTEROL SULFATE HFA 108 (90 BASE) MCG/ACT IN AERS
2.0000 | INHALATION_SPRAY | Freq: Once | RESPIRATORY_TRACT | Status: AC
  Administered 2024-02-12: 2 via RESPIRATORY_TRACT

## 2024-02-12 MED ORDER — PROMETHAZINE-DM 6.25-15 MG/5ML PO SYRP: 5.0000 mL | ORAL_SOLUTION | Freq: Every evening | ORAL | 0 refills | Status: AC | PRN

## 2024-02-12 MED ORDER — AEROCHAMBER PLUS FLO-VU MEDIUM MISC
1.0000 | Freq: Once | Status: AC
  Administered 2024-02-12: 1

## 2024-02-12 NOTE — ED Provider Notes (Signed)
 [facility]    CSN: [id number] Arrival date & time: 02/12/24  1332      History   Chief Complaint Chief Complaint  Patient presents with  . Cough    Entered by patient    HPI Gabrielle Perkins is a 48 y.o. female.   Gabrielle Perkins is a 48 y.o. female presenting for chief complaint of Cough, nasal congestion, and generalized fatigue that started 2 days ago on Friday Feb 10, 2024. Cough is productive with yellow/green sputum and she reports intermittent shortness of breath associated with coughing.  Denies chest pain, heart palpitations, nausea, vomiting, diarrhea, abdominal pain, dizziness, ear pain, sore throat, headache, and leg swelling/orthopnea.  History of type 2 diabetes, last hemoglobin A1c was 6.7 and she states sugars have been mostly well-controlled while sick.  Denies history of asthma/COPD.  Never smoker.  No recent sick contacts with similar symptoms. Denies recent antibiotic or steroid use.  She has been taking Robitussin at home with minimal relief.     Cough   Past Medical History:  Diagnosis Date  . Diabetes mellitus without complication (HCC)   . GERD (gastroesophageal reflux disease)   . Hypertension     Patient Active Problem List   Diagnosis Date Noted  . Encounter for general adult medical examination w/o abnormal findings 11/16/2023  . Fatigue 11/16/2023  . URI with cough and congestion 10/02/2023  . Class 2 severe obesity due to excess calories with serious comorbidity and body mass index (BMI) of 39.0 to 39.9 in adult (HCC) 03/28/2023  . Class 2 obesity due to excess calories without serious comorbidity with body mass index (BMI) of 39.0 to 39.9 in adult 11/03/2022  . 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 Witnessed apneic spells 03/17/2022  . Sleeps in sitting position due to orthopnea 03/17/2022  . Morning headache 03/17/2022  . Excessive daytime sleepiness 03/17/2022  . Pure hypercholesterolemia 03/18/2021  . Iron  deficiency anemia  due to chronic blood loss 03/18/2021  . Obesity, diabetes, and hypertension syndrome (HCC) 09/10/2019  . Uncontrolled type 2 diabetes mellitus with hyperglycemia (HCC) 09/10/2019  . 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 Essential hypertension, benign 09/08/2017  . Diabetes mellitus without complication (HCC) 09/08/2017  . Chest pain 09/08/2017  . Hypokalemia 09/08/2017    Past Surgical History:  Procedure Laterality Date  . btl    . LEFT HEART CATH AND CORONARY ANGIOGRAPHY N/A 09/12/2017   Procedure: LEFT HEART CATH AND CORONARY ANGIOGRAPHY;  Surgeon: Pasqual Bone, MD;  Location: [facility];  Service: Cardiovascular;  Laterality: N/A;  . LIPOMA EXCISION N/A 10/04/2019   Procedure: EXCISION OF FOREHEAD LIPOMA;  Surgeon: Barb Bonito, MD;  Location: [facility];  Service: Plastics;  Laterality: N/A;  45 min, please  . TUBAL LIGATION      OB History   No obstetric history on file.      Home Medications    Prior to Admission medications   Medication Sig Start Date End Date Taking? Authorizing Provider  acetaminophen  (TYLENOL ) 500 MG tablet Take 500 mg by mouth every 6 (six) hours as needed.    [provider]  amLODipine  (NORVASC ) 5 MG tablet Take 1 tablet (5 mg total) by mouth daily. 11/16/23   Cleave Curling, MD  benzonatate  (TESSALON  PERLES) 100 MG capsule Take 1 capsule (100 mg total) by mouth 3 (three) times daily as needed. 09/28/23 09/27/24  Melodie Spry, NP  Blood Glucose Monitoring Suppl (FREESTYLE FREEDOM LITE) w/Device KIT Use as  directed to check blood sugars 2 times per day dx: e11.22 05/10/19   Cleave Curling, MD  cetirizine  (ZYRTEC ) 10 MG tablet Take 1 tablet (10 mg total) by mouth daily. 12/20/22   Cleave Curling, MD  empagliflozin  (JARDIANCE ) 25 MG TABS tablet Take 1 tablet (25 mg total) by mouth daily before breakfast. 11/16/23   Cleave Curling, MD  fluticasone  (FLONASE ) 50 MCG/ACT nasal spray Place 2 sprays into both nostrils daily. 12/20/22   Cleave Curling, MD  glucose blood (FREESTYLE LITE) test strip Use as instructed to check blood sugars 2 times per day dx: e11.22 05/10/19   Cleave Curling, MD  levonorgestrel (MIRENA, 52 MG,) 20 MCG/DAY IUD Mirena 20 mcg/24 hours (7 yrs) 52 mg intrauterine device  Take 1 device by intrauterine route.    [provider]  metFORMIN  (GLUCOPHAGE -XR) 500 MG 24 hr tablet Take 2 tablets (1,000 mg total) by mouth daily with supper. 11/16/23   Cleave Curling, MD  metoprolol  succinate (TOPROL  XL) 50 MG 24 hr tablet Take 1 tablet (50 mg total) by mouth daily. 11/16/23   Cleave Curling, MD  Microlet Lancets MISC Use as directed to check blood sugars 2 times per day dx: e11.22 05/10/19   Cleave Curling, MD  omeprazole  (PRILOSEC) 40 MG capsule Take 1 capsule (40 mg total) by mouth daily. 12/20/22 12/20/23  Cleave Curling, MD  Semaglutide , 2 MG/DOSE, (OZEMPIC , 2 MG/DOSE,) 8 MG/3ML SOPN INJECT 2MG  ONCE WEEKLY. 11/16/23   Cleave Curling, MD  telmisartan -hydrochlorothiazide  (MICARDIS  HCT) 80-25 MG tablet Take 1 tablet by mouth daily. 11/16/23   Cleave Curling, MD    Family History Family History  Problem Relation Age of Onset  . Hypertension Mother   . Diabetes Mother   . Hypertension Father   . Diabetes Father   . Sarcoidosis Father   . Breast cancer Maternal Grandmother   . Hypertension Maternal Grandmother   . Coronary artery disease Maternal Grandmother   . Hypertension Maternal Grandfather     Social History Social History   Tobacco Use  . Smoking status: Never  . Smokeless tobacco: Never  Vaping Use  . Vaping status: Never Used  Substance Use Topics  . Alcohol use: No  . Drug use: Never     Allergies   Patient has no known allergies.   Review of Systems Review of Systems  Respiratory:  Positive for cough.   Per HPI   Physical Exam Triage Vital Signs ED Triage Vitals  Encounter Vitals Group     BP 02/12/24 1336 (!) 151/101     Systolic BP Percentile --      Diastolic BP Percentile --       Pulse Rate 02/12/24 1336 (!) 134     Resp 02/12/24 1336 17     Temp 02/12/24 1336 98.7 F (37.1 C)     Temp Source 02/12/24 1336 Oral     SpO2 02/12/24 1336 95 %     Weight --      Height --      Head Circumference --      Peak Flow --      Pain Score 02/12/24 1340 0     Pain Loc --      Pain Education --      Exclude from Growth Chart --    No data found.  Updated Vital Signs BP (!) 141/97 (BP Location: Right Arm)   Pulse (!) 132   Temp (!) 100.5 F (38.1 C) (Oral)   Resp 18  SpO2 95%   Visual Acuity Right Eye Distance:   Left Eye Distance:   Bilateral Distance:    Right Eye Near:   Left Eye Near:    Bilateral Near:     Physical Exam Vitals and nursing note reviewed.  Constitutional:      Appearance: She is not ill-appearing or toxic-appearing.  HENT:     Head: Normocephalic and atraumatic.     Right Ear: Hearing, tympanic membrane, ear canal and external ear normal.     Left Ear: Hearing, tympanic membrane, ear canal and external ear normal.     Nose: Congestion present.     Mouth/Throat:     Lips: Pink.     Mouth: Mucous membranes are moist. No injury or oral lesions.     Dentition: Normal dentition.     Tongue: No lesions.     Pharynx: Oropharynx is clear. Uvula midline. No pharyngeal swelling, oropharyngeal exudate, posterior oropharyngeal erythema, uvula swelling or postnasal drip.     Tonsils: No tonsillar exudate.  Eyes:     General: Lids are normal. Vision grossly intact. Gaze aligned appropriately.     Extraocular Movements: Extraocular movements intact.     Conjunctiva/sclera: Conjunctivae normal.  Neck:     Trachea: Trachea and phonation normal.  Cardiovascular:     Rate and Rhythm: Regular rhythm. Tachycardia present.     Heart sounds: Normal heart sounds, S1 normal and S2 normal.     Comments: Tachycardia-physiologic response to fever.  Rate is regular. Pulmonary:     Effort: Pulmonary effort is normal. No respiratory distress.      Breath sounds: Normal breath sounds and air entry. No wheezing, rhonchi or rales.     Comments: Harsh and dry cough on exam.  Speaking in full sentences without difficulty. Chest:     Chest wall: No tenderness.  Musculoskeletal:     Cervical back: Neck supple.  Lymphadenopathy:     Cervical: No cervical adenopathy.  Skin:    General: Skin is warm and dry.     Capillary Refill: Capillary refill takes less than 2 seconds.     Findings: No rash.  Neurological:     General: No focal deficit present.     Mental Status: She is alert and oriented to person, place, and time. Mental status is at baseline.     Cranial Nerves: No dysarthria or facial asymmetry.  Psychiatric:        Mood and Affect: Mood normal.        Speech: Speech normal.        Behavior: Behavior normal.        Thought Content: Thought content normal.        Judgment: Judgment normal.     UC Treatments / Results  Labs (all labs ordered are listed, but only abnormal results are displayed) Labs Reviewed  POC COVID19/FLU A&B COMBO    EKG   Radiology No results found.  Procedures Procedures (including critical care time)  Medications Ordered in UC Medications  acetaminophen  (TYLENOL ) tablet 975 mg (975 mg Oral Given 02/12/24 1347)    Initial Impression / Assessment and Plan / UC Course  I have reviewed the triage vital signs and the nursing notes.  Pertinent labs & imaging results that were available during my care of the patient were reviewed by me and considered in my medical decision making (see chart for details).     *** Final Clinical Impressions(s) / UC Diagnoses   Final diagnoses:  Acute cough   Discharge Instructions   None    ED Prescriptions   None    PDMP not reviewed this encounter.

## 2024-02-12 NOTE — ED Triage Notes (Signed)
 Pt c/o cough since friday

## 2024-02-12 NOTE — Discharge Instructions (Addendum)
 Your exam today shows concern for pneumonia and bronchitis.  Pneumonia is a localized infection in the lungs that requires antibiotics.   Take antibiotics as prescribed.  Use albuterol every 4-6 hours as needed for cough, shortness of breath, and wheezing.   Use promethazine DM cough syrup at bedtime- note this medicine will make you drowsy.   If you develop any new or worsening symptoms or if your symptoms do not start to improve, please return here or follow-up with your primary care provider. If your symptoms are severe, please go to the emergency room.

## 2024-02-23 ENCOUNTER — Ambulatory Visit (INDEPENDENT_AMBULATORY_CARE_PROVIDER_SITE_OTHER): Admitting: Nurse Practitioner

## 2024-02-23 ENCOUNTER — Encounter: Payer: Self-pay | Admitting: Nurse Practitioner

## 2024-02-23 VITALS — BP 122/80 | HR 82 | Temp 98.5°F | Ht 63.0 in | Wt 225.2 lb

## 2024-02-23 DIAGNOSIS — Z6839 Body mass index (BMI) 39.0-39.9, adult: Secondary | ICD-10-CM

## 2024-02-23 DIAGNOSIS — J189 Pneumonia, unspecified organism: Secondary | ICD-10-CM | POA: Diagnosis not present

## 2024-02-23 DIAGNOSIS — E66812 Obesity, class 2: Secondary | ICD-10-CM

## 2024-02-23 DIAGNOSIS — R051 Acute cough: Secondary | ICD-10-CM

## 2024-02-23 NOTE — Progress Notes (Signed)
 [place], CMA,acting as a Neurosurgeon for Susanna Epley, FNP.,have documented all relevant documentation on the behalf of Susanna Epley, FNP,as directed by  Susanna Epley, FNP while in the presence of Susanna Epley, FNP.  Subjective:  Patient ID: Gabrielle Perkins , female    DOB: [birth year] , 48 y.o.   MRN: [id number]  Chief Complaint  Patient presents with   Pneumonia    Patient presents today for a hospital follow up, patient reports compliance with medications. Patient denies any chest pain, SOB, or headaches. Patient diagnosed with Community acquired pneumonia of left upper lobe of lung.  Patient reports today she is feeling better but she is still coughing a lot.    HPI  Patient presents for UC follow up for pneumonia.  Patient was treated with Augmentin , azithromycin , sample albuterol  inhaler and promethazine  cough syrup.   No OTC medication taken.   Endorses continued cough with production of thick, green sputum.  No fever, SOB at rest, chest pain.  Endorses wheezing at times.  Patient has returned to work and is able to perform her regular daily activities.    Pneumonia She complains of cough, sputum production (thick yellow/green) and wheezing. There is no chest tightness or shortness of breath. This is a new problem. The current episode started 1 to 4 weeks ago. The problem has been waxing and waning. The cough is productive of sputum. Associated symptoms include a fever (initially) and malaise/fatigue. Pertinent negatives include no appetite change, chest pain, ear pain or trouble swallowing. Her symptoms are aggravated by nothing. Her symptoms are alleviated by prescription cough suppressant and steroid inhaler. She reports minimal improvement on treatment. There are no known risk factors for lung disease. There is no history of asthma, bronchitis or COPD.     Past Medical History:  Diagnosis Date   Diabetes mellitus without complication (HCC)    GERD (gastroesophageal reflux  disease)    Hypertension      Family History  Problem Relation Age of Onset   Hypertension Mother    Diabetes Mother    Hypertension Father    Diabetes Father    Sarcoidosis Father    Breast cancer Maternal Grandmother    Hypertension Maternal Grandmother    Coronary artery disease Maternal Grandmother    Hypertension Maternal Grandfather      Current Outpatient Medications:    acetaminophen  (TYLENOL ) 500 MG tablet, Take 500 mg by mouth every 6 (six) hours as needed., Disp: , Rfl:    amLODipine  (NORVASC ) 5 MG tablet, Take 1 tablet (5 mg total) by mouth daily., Disp: 90 tablet, Rfl: 2   amoxicillin -clavulanate (AUGMENTIN ) 875-125 MG tablet, Take 1 tablet by mouth every 12 (twelve) hours., Disp: 14 tablet, Rfl: 0   benzonatate  (TESSALON  PERLES) 100 MG capsule, Take 1 capsule (100 mg total) by mouth 3 (three) times daily as needed for cough., Disp: 30 capsule, Rfl: 1   Blood Glucose Monitoring Suppl (FREESTYLE FREEDOM LITE) w/Device KIT, Use as directed to check blood sugars 2 times per day dx: e11.22, Disp: 1 kit, Rfl: 1   cetirizine  (ZYRTEC ) 10 MG tablet, Take 1 tablet (10 mg total) by mouth daily., Disp: 90 tablet, Rfl: 2   empagliflozin  (JARDIANCE ) 25 MG TABS tablet, Take 1 tablet (25 mg total) by mouth daily before breakfast., Disp: 90 tablet, Rfl: 2   fluticasone  (FLONASE ) 50 MCG/ACT nasal spray, Place 2 sprays into both nostrils daily., Disp: 16 g, Rfl: 2   glucose blood (FREESTYLE LITE) test strip,  Use as instructed to check blood sugars 2 times per day dx: e11.22, Disp: 150 each, Rfl: 2   levonorgestrel (MIRENA, 52 MG,) 20 MCG/DAY IUD, Mirena 20 mcg/24 hours (7 yrs) 52 mg intrauterine device  Take 1 device by intrauterine route., Disp: , Rfl:    metFORMIN  (GLUCOPHAGE -XR) 500 MG 24 hr tablet, Take 2 tablets (1,000 mg total) by mouth daily with supper., Disp: 180 tablet, Rfl: 1   metoprolol  succinate (TOPROL  XL) 50 MG 24 hr tablet, Take 1 tablet (50 mg total) by mouth daily., Disp:  90 tablet, Rfl: 2   Microlet Lancets MISC, Use as directed to check blood sugars 2 times per day dx: e11.22, Disp: 150 each, Rfl: 2   omeprazole  (PRILOSEC) 40 MG capsule, Take 1 capsule (40 mg total) by mouth daily., Disp: 90 capsule, Rfl: 2   promethazine -dextromethorphan (PROMETHAZINE -DM) 6.25-15 MG/5ML syrup, Take 5 mLs by mouth at bedtime as needed for cough., Disp: 118 mL, Rfl: 0   Semaglutide , 2 MG/DOSE, (OZEMPIC , 2 MG/DOSE,) 8 MG/3ML SOPN, INJECT 2MG  ONCE WEEKLY., Disp: 3 mL, Rfl: 3   telmisartan -hydrochlorothiazide  (MICARDIS  HCT) 80-25 MG tablet, Take 1 tablet by mouth daily., Disp: 90 tablet, Rfl: 2   azithromycin  (ZITHROMAX ) 250 MG tablet, Take 1 tablet (250 mg total) by mouth daily. Take first 2 tablets together, then 1 every day until finished., Disp: 6 tablet, Rfl: 0  Current Facility-Administered Medications:    triamcinolone  acetonide (KENALOG ) 10 MG/ML injection 10 mg, 10 mg, Other, Once, Regal, Norman S, DPM   No Known Allergies   Review of Systems  Constitutional:  Positive for fever (initially) and malaise/fatigue. Negative for appetite change.  HENT:  Negative for ear discharge, ear pain and trouble swallowing.   Respiratory:  Positive for cough, sputum production (thick yellow/green) and wheezing. Negative for shortness of breath.   Cardiovascular:  Negative for chest pain, palpitations and leg swelling.  Gastrointestinal:  Negative for constipation, diarrhea and nausea.     Today's Vitals   02/23/24 1130  BP: 122/80  Pulse: 82  Temp: 98.5 F (36.9 C)  Weight: 225 lb 3.2 oz (102.2 kg)  Height: 5\' 3"  (1.6 m)  PainSc: 0-No pain   Body mass index is 39.89 kg/m.  Wt Readings from Last 3 Encounters:  02/23/24 225 lb 3.2 oz (102.2 kg)  11/16/23 223 lb (101.2 kg)  08/01/23 227 lb 3.2 oz (103.1 kg)      Objective:  Physical Exam Constitutional:      General: She is not in acute distress.    Appearance: Normal appearance.  HENT:     Head: Normocephalic and  atraumatic.     Mouth/Throat:     Mouth: Mucous membranes are moist.     Pharynx: Oropharynx is clear.  Eyes:     Pupils: Pupils are equal, round, and reactive to light.  Cardiovascular:     Rate and Rhythm: Normal rate and regular rhythm.     Pulses: Normal pulses.     Heart sounds: Normal heart sounds.  Pulmonary:     Effort: Pulmonary effort is normal. No tachypnea or respiratory distress.     Breath sounds: Examination of the right-lower field reveals decreased breath sounds. Examination of the left-lower field reveals decreased breath sounds. Decreased breath sounds present. No wheezing, rhonchi or rales.  Chest:     Chest wall: No tenderness.  Skin:    General: Skin is warm and dry.     Capillary Refill: Capillary refill takes less than 2 seconds.  Neurological:     General: No focal deficit present.     Mental Status: She is alert and oriented to person, place, and time.  Psychiatric:        Mood and Affect: Mood normal.        Behavior: Behavior normal.        Thought Content: Thought content normal.         Assessment And Plan:  Community acquired pneumonia of left upper lobe of lung Assessment & Plan: Patient treated at urgent care with antibiotics, albuterol  inhaler, promethazine  cough syrup.  Productive cough continues, tessalon  pearls ordered as well as repeat chest xray for 4 weeks post diagnosis.    Orders: -     DG Chest 2 View; Future  Acute cough -     Benzonatate ; Take 1 capsule (100 mg total) by mouth 3 (three) times daily as needed for cough.  Dispense: 30 capsule; Refill: 1  Class 2 severe obesity due to excess calories with serious comorbidity and body mass index (BMI) of 39.0 to 39.9 in adult [facility]) Assessment & Plan: She is encouraged to strive for BMI less than 30 to decrease cardiac risk. Advised to aim for at least 150 minutes of exercise per week as tolerated      Return for keep same next.  Patient was given opportunity to ask questions.  Patient verbalized understanding of the plan and was able to repeat key elements of the plan. All questions were answered to their satisfaction.   I have reviewed this encounter including the documentation in this note and/or discussed this patient with Mickael Alamo FNP Student. I am certifying that I agree with the content of this note as the primary care nurse practitioner.  Susanna Epley, DNP, FNP-BC  I, Susanna Epley, FNP, have reviewed all documentation for this visit. The documentation on 02/23/24 for the exam, diagnosis, procedures, and orders are all accurate and complete.   IF YOU HAVE BEEN REFERRED TO A SPECIALIST, IT MAY TAKE 1-2 WEEKS TO SCHEDULE/PROCESS THE REFERRAL. IF YOU HAVE NOT HEARD FROM US /SPECIALIST IN TWO WEEKS, PLEASE GIVE US  A CALL AT [phone].

## 2024-02-23 NOTE — Patient Instructions (Signed)
 Make sure you get your repeat chest xray after 03/14/24, within one week of that day.    Drink plenty of fluids to help thin mucus production.  If your symptoms become worse, please give us  a call or send a message in my chart

## 2024-02-23 NOTE — Assessment & Plan Note (Signed)
 Patient treated at urgent care with antibiotics, albuterol  inhaler, promethazine  cough syrup.  Productive cough continues, tessalon  pearls ordered as well as repeat chest xray for 4 weeks post diagnosis.

## 2024-02-24 MED ORDER — BENZONATATE 100 MG PO CAPS: 100.0000 mg | ORAL_CAPSULE | Freq: Three times a day (TID) | ORAL | 1 refills | Status: AC | PRN

## 2024-02-26 ENCOUNTER — Encounter: Payer: Self-pay | Admitting: Nurse Practitioner

## 2024-02-26 NOTE — Assessment & Plan Note (Signed)
 She is encouraged to strive for BMI less than 30 to decrease cardiac risk. Advised to aim for at least 150 minutes of exercise per week as tolerated

## 2024-03-15 ENCOUNTER — Ambulatory Visit: Payer: Self-pay | Admitting: Internal Medicine

## 2024-03-20 ENCOUNTER — Ambulatory Visit
Admission: RE | Admit: 2024-03-20 | Discharge: 2024-03-20 | Disposition: A | Discharge status: Home / Self Care | Source: Ambulatory Visit | Attending: Nurse Practitioner | Admitting: Nurse Practitioner

## 2024-03-20 ENCOUNTER — Ambulatory Visit: Payer: Self-pay | Admitting: Nurse Practitioner

## 2024-03-20 DIAGNOSIS — J189 Pneumonia, unspecified organism: Secondary | ICD-10-CM

## 2024-03-22 ENCOUNTER — Ambulatory Visit: Admitting: Internal Medicine

## 2024-03-22 ENCOUNTER — Encounter: Payer: Self-pay | Admitting: Internal Medicine

## 2024-03-22 VITALS — BP 118/80 | Temp 98.9°F | Ht 63.0 in | Wt 229.4 lb

## 2024-03-22 DIAGNOSIS — E66813 Obesity, class 3: Secondary | ICD-10-CM

## 2024-03-22 DIAGNOSIS — E1169 Type 2 diabetes mellitus with other specified complication: Secondary | ICD-10-CM

## 2024-03-22 DIAGNOSIS — E7841 Elevated Lipoprotein(a): Secondary | ICD-10-CM

## 2024-03-22 DIAGNOSIS — I10 Essential (primary) hypertension: Secondary | ICD-10-CM

## 2024-03-22 DIAGNOSIS — Z6841 Body Mass Index (BMI) 40.0 and over, adult: Secondary | ICD-10-CM

## 2024-03-22 DIAGNOSIS — R3915 Urgency of urination: Secondary | ICD-10-CM | POA: Diagnosis not present

## 2024-03-22 DIAGNOSIS — E785 Hyperlipidemia, unspecified: Secondary | ICD-10-CM

## 2024-03-22 LAB — POCT URINALYSIS DIPSTICK
Bilirubin, UA: NEGATIVE
Blood, UA: NEGATIVE
Glucose, UA: POSITIVE — AB
Ketones, UA: NEGATIVE
Leukocytes, UA: NEGATIVE
Nitrite, UA: NEGATIVE
Protein, UA: NEGATIVE
Spec Grav, UA: 1.02 (ref 1.010–1.025)
Urobilinogen, UA: 0.2 U/dL
pH, UA: 5 (ref 5.0–8.0)

## 2024-03-22 NOTE — Progress Notes (Signed)
 I,[name], CMA,acting as a Neurosurgeon for [name], MD.,have documented all relevant documentation on the behalf of [name], MD,as directed by  [name], MD while in the presence of [name], MD.  Subjective:  Patient ID: Gabrielle Perkins , female    DOB: [birth year] , 48 y.o.   MRN: [id number]  Chief Complaint  Patient presents with   Diabetes    Patient presents today for dm & bpc. She reports compliance with medications. Denies headache, chest pain & sob.  She states noticing having a  weak bladder. She also at times leaks urine.    Hypertension    HPI Discussed the use of AI scribe software for clinical note transcription with the patient, who gave verbal consent to proceed.  History of Present Illness Gabrielle Perkins is a 48 year old female with diabetes and hypertension who presents for a routine check-up.  She has not had her annual eye exam this year, which is important for her diabetes management. Her last Pap smear was in 2022, and she believes she had one last year as well.  She experiences urinary urgency, with an inability to hold her urine, leading to potential incontinence if she does not reach the bathroom in time. She has had four vaginal deliveries. She is concerned about her family history of kidney issues, as both aunts on her father's side had kidney transplants.  Her current medications include amlodipine  5 mg, Jardiance  25 mg daily, metformin  twice daily, metoprolol  50 mg XL daily, telmisartan  80/25 mg, Ozempic  2 mg weekly, Zyrtec , Flonase  as needed, and Prilosec 40 mg. She has completed courses of Augmentin , Z-Pak, and amoxicillin  for pneumonia. She also has an IUD in place.  No issues with bowel movements.   Diabetes She presents for her follow-up diabetic visit. She has type 2 diabetes mellitus. There are no hypoglycemic associated symptoms. There are no diabetic associated symptoms. Pertinent negatives for diabetes include no  polydipsia, no polyphagia and no polyuria. There are no hypoglycemic complications. Risk factors for coronary artery disease include diabetes mellitus, dyslipidemia, hypertension, obesity and sedentary lifestyle. Current diabetic treatment includes oral agent (monotherapy). She is compliant with treatment some of the time. She is following a generally healthy diet. She participates in exercise intermittently. Her breakfast blood glucose is taken between 8-9 am. Her breakfast blood glucose range is generally 90-110 mg/dl. (97/-120 blood sugar) Eye exam is current.  Hypertension This is a chronic problem. The current episode started more than 1 year ago. The problem has been gradually improving since onset. The problem is controlled. Past treatments include angiotensin blockers and diuretics. The current treatment provides moderate improvement. Compliance problems include exercise.      Past Medical History:  Diagnosis Date   Diabetes mellitus without complication (HCC)    GERD (gastroesophageal reflux disease)    Hypertension      Family History  Problem Relation Age of Onset   Hypertension Mother    Diabetes Mother    Hypertension Father    Diabetes Father    Sarcoidosis Father    Breast cancer Maternal Grandmother    Hypertension Maternal Grandmother    Coronary artery disease Maternal Grandmother    Hypertension Maternal Grandfather      Current Outpatient Medications:    acetaminophen  (TYLENOL ) 500 MG tablet, Take 500 mg by mouth every 6 (six) hours as needed., Disp: , Rfl:    amLODipine  (NORVASC ) 5 MG tablet, Take 1 tablet (5 mg  total) by mouth daily., Disp: 90 tablet, Rfl: 2   benzonatate  (TESSALON  PERLES) 100 MG capsule, Take 1 capsule (100 mg total) by mouth 3 (three) times daily as needed for cough., Disp: 30 capsule, Rfl: 1   Blood Glucose Monitoring Suppl (FREESTYLE FREEDOM LITE) w/Device KIT, Use as directed to check blood sugars 2 times per day dx: e11.22, Disp: 1 kit, Rfl:  1   cetirizine  (ZYRTEC ) 10 MG tablet, Take 1 tablet (10 mg total) by mouth daily., Disp: 90 tablet, Rfl: 2   empagliflozin  (JARDIANCE ) 25 MG TABS tablet, Take 1 tablet (25 mg total) by mouth daily before breakfast., Disp: 90 tablet, Rfl: 2   fluticasone  (FLONASE ) 50 MCG/ACT nasal spray, Place 2 sprays into both nostrils daily., Disp: 16 g, Rfl: 2   glucose blood (FREESTYLE LITE) test strip, Use as instructed to check blood sugars 2 times per day dx: e11.22, Disp: 150 each, Rfl: 2   levonorgestrel (MIRENA, 52 MG,) 20 MCG/DAY IUD, Mirena 20 mcg/24 hours (7 yrs) 52 mg intrauterine device  Take 1 device by intrauterine route., Disp: , Rfl:    metFORMIN  (GLUCOPHAGE -XR) 500 MG 24 hr tablet, Take 2 tablets (1,000 mg total) by mouth daily with supper., Disp: 180 tablet, Rfl: 1   metoprolol  succinate (TOPROL  XL) 50 MG 24 hr tablet, Take 1 tablet (50 mg total) by mouth daily., Disp: 90 tablet, Rfl: 2   Microlet Lancets MISC, Use as directed to check blood sugars 2 times per day dx: e11.22, Disp: 150 each, Rfl: 2   omeprazole  (PRILOSEC) 40 MG capsule, Take 1 capsule (40 mg total) by mouth daily., Disp: 90 capsule, Rfl: 2   promethazine -dextromethorphan (PROMETHAZINE -DM) 6.25-15 MG/5ML syrup, Take 5 mLs by mouth at bedtime as needed for cough., Disp: 118 mL, Rfl: 0   Semaglutide , 2 MG/DOSE, (OZEMPIC , 2 MG/DOSE,) 8 MG/3ML SOPN, INJECT 2MG  ONCE WEEKLY., Disp: 3 mL, Rfl: 3   telmisartan -hydrochlorothiazide  (MICARDIS  HCT) 80-25 MG tablet, Take 1 tablet by mouth daily., Disp: 90 tablet, Rfl: 2  Current Facility-Administered Medications:    triamcinolone  acetonide (KENALOG ) 10 MG/ML injection 10 mg, 10 mg, Other, Once, [place], [place] RAMAN, DPM   No Known Allergies   Review of Systems  Constitutional: Negative.   Respiratory: Negative.    Cardiovascular: Negative.   Endocrine: Negative for polydipsia, polyphagia and polyuria.  Neurological: Negative.   Psychiatric/Behavioral: Negative.       Today's Vitals    03/22/24 1604  BP: 118/80  Temp: 98.9 F (37.2 C)  SpO2: 98%  Weight: 229 lb 6.4 oz (104.1 kg)  Height: 5' 3 (1.6 m)   Body mass index is 40.64 kg/m.  Wt Readings from Last 3 Encounters:  03/22/24 229 lb 6.4 oz (104.1 kg)  02/23/24 225 lb 3.2 oz (102.2 kg)  11/16/23 223 lb (101.2 kg)     Objective:  Physical Exam Vitals and nursing note reviewed.  Constitutional:      Appearance: Normal appearance. She is obese.  HENT:     Head: Normocephalic and atraumatic.  Eyes:     Extraocular Movements: Extraocular movements intact.  Cardiovascular:     Rate and Rhythm: Normal rate and regular rhythm.     Heart sounds: Normal heart sounds.  Pulmonary:     Effort: Pulmonary effort is normal.     Breath sounds: Normal breath sounds.  Musculoskeletal:     Cervical back: Normal range of motion.  Skin:    General: Skin is warm.  Neurological:     General:  No focal deficit present.     Mental Status: She is alert.  Psychiatric:        Mood and Affect: Mood normal.        Behavior: Behavior normal.         Assessment And Plan:  Dyslipidemia associated with type 2 diabetes mellitus (HCC) Assessment & Plan: Chronic, LDL goal is less than 70.  She is not currently on statin therapy. She has had side effects in the past. She will continue with current diabetes management.   She will continue with Jardiance  25mg  daily, metformin  XR 500mg  2 tabs daily and Ozempic  2mg  weekly. She is reminded to optimize her protein intake and to incorporate strength training to decrease risk of muscle atrophy.   Orders: -     CMP14+EGFR -     Hemoglobin A1c  Essential hypertension, benign Assessment & Plan: Chronic, blood pressure well-controlled. Discussed medication timing to optimize control. - Continue amlodipine  5 mg daily. - Continue telmisartan  80/25 mg daily. - Continue metoprolol  50 mg XL daily. - Advise taking telmisartan  and amlodipine  in the morning, metoprolol  XL at night.   Urinary  urgency Assessment & Plan: Symptoms suggest bladder-related issues, likely due to pelvic floor weakness. Plan to rule out infection. - Obtain urine sample to rule out infection. - Treat infection if present. - Recommend pelvic floor exercises if no infection. - Refer to physical therapy if symptoms persist.  Orders: -     POCT urinalysis dipstick -     Ambulatory referral to Physical Therapy  Elevated lipoprotein(a) Assessment & Plan: LDL goal is less than 70, she is at goal without medication.  However, due to elevated Lp(a), she would likely benefit from statin therapy. Consider low dose statin three days weekly.  - encouraged to follow heart healthy lifestyle.    Class 3 severe obesity with serious comorbidity and body mass index (BMI) of 40.0 to 44.9 in adult Assessment & Plan: BMI 40.  She has gained six pounds since Feb 2025.  She is encouraged to initially strive for BMI less than 35 to decrease cardiac risk. Advised to aim for at least 150 minutes of exercise per week.     Return for 4 month dm f/u.SABRA  Patient was given opportunity to ask questions. Patient verbalized understanding of the plan and was able to repeat key elements of the plan. All questions were answered to their satisfaction.    I, [name], MD, have reviewed all documentation for this visit. The documentation on 03/22/24 for the exam, diagnosis, procedures, and orders are all accurate and complete.   IF YOU HAVE BEEN REFERRED TO A SPECIALIST, IT MAY TAKE 1-2 WEEKS TO SCHEDULE/PROCESS THE REFERRAL. IF YOU HAVE NOT HEARD FROM US /SPECIALIST IN TWO WEEKS, PLEASE GIVE US  A CALL AT [phone] X [phone].   THE PATIENT IS ENCOURAGED TO PRACTICE SOCIAL DISTANCING DUE TO THE COVID-19 PANDEMIC.

## 2024-03-22 NOTE — Patient Instructions (Signed)

## 2024-03-23 LAB — CMP14+EGFR
ALT: 25 IU/L (ref 0–32)
AST: 21 IU/L (ref 0–40)
Albumin: 4.4 g/dL (ref 3.9–4.9)
Alkaline Phosphatase: 130 IU/L — ABNORMAL HIGH (ref 44–121)
BUN/Creatinine Ratio: 8 — ABNORMAL LOW (ref 9–23)
BUN: 6 mg/dL (ref 6–24)
Bilirubin Total: 0.3 mg/dL (ref 0.0–1.2)
CO2: 21 mmol/L (ref 20–29)
Calcium: 9.8 mg/dL (ref 8.7–10.2)
Chloride: 98 mmol/L (ref 96–106)
Creatinine, Ser: 0.78 mg/dL (ref 0.57–1.00)
Globulin, Total: 3.4 g/dL (ref 1.5–4.5)
Glucose: 151 mg/dL — ABNORMAL HIGH (ref 70–99)
Potassium: 3.8 mmol/L (ref 3.5–5.2)
Sodium: 136 mmol/L (ref 134–144)
Total Protein: 7.8 g/dL (ref 6.0–8.5)
eGFR: 95 mL/min/1.73 (ref >59)

## 2024-03-23 LAB — HEMOGLOBIN A1C
Est. average glucose Bld gHb Est-mCnc: 183 mg/dL
Hgb A1c MFr Bld: 8 % — ABNORMAL HIGH (ref 4.8–5.6)

## 2024-03-24 ENCOUNTER — Ambulatory Visit: Payer: Self-pay | Admitting: Internal Medicine

## 2024-03-26 DIAGNOSIS — R3915 Urgency of urination: Secondary | ICD-10-CM | POA: Insufficient documentation

## 2024-03-26 DIAGNOSIS — E7841 Elevated Lipoprotein(a): Secondary | ICD-10-CM | POA: Insufficient documentation

## 2024-03-26 NOTE — Assessment & Plan Note (Signed)
 Symptoms suggest bladder-related issues, likely due to pelvic floor weakness. Plan to rule out infection. - Obtain urine sample to rule out infection. - Treat infection if present. - Recommend pelvic floor exercises if no infection. - Refer to physical therapy if symptoms persist.

## 2024-03-26 NOTE — Assessment & Plan Note (Signed)
 LDL goal is less than 70, she is at goal without medication.  However, due to elevated Lp(a), she would likely benefit from statin therapy. Consider low dose statin three days weekly.  - encouraged to follow heart healthy lifestyle.

## 2024-03-26 NOTE — Assessment & Plan Note (Signed)
 Chronic, LDL goal is less than 70.  She is not currently on statin therapy. She has had side effects in the past. She will continue with current diabetes management.   She will continue with Jardiance  25mg  daily, metformin  XR 500mg  2 tabs daily and Ozempic  2mg  weekly. She is reminded to optimize her protein intake and to incorporate strength training to decrease risk of muscle atrophy.

## 2024-03-26 NOTE — Assessment & Plan Note (Signed)
 Chronic, blood pressure well-controlled. Discussed medication timing to optimize control. - Continue amlodipine  5 mg daily. - Continue telmisartan  80/25 mg daily. - Continue metoprolol  50 mg XL daily. - Advise taking telmisartan  and amlodipine  in the morning, metoprolol  XL at night.

## 2024-03-26 NOTE — Assessment & Plan Note (Signed)
 BMI 40.  She has gained six pounds since Feb 2025.  She is encouraged to initially strive for BMI less than 35 to decrease cardiac risk. Advised to aim for at least 150 minutes of exercise per week.

## 2024-05-07 ENCOUNTER — Other Ambulatory Visit: Payer: Self-pay

## 2024-05-07 MED ORDER — FREESTYLE LITE TEST VI STRP: ORAL_STRIP | 2 refills | Status: AC

## 2024-05-07 MED ORDER — CETIRIZINE HCL 10 MG PO TABS: 10.0000 mg | ORAL_TABLET | Freq: Every day | ORAL | 2 refills | Status: AC

## 2024-05-07 MED ORDER — OMEPRAZOLE 40 MG PO CPDR: 40.0000 mg | DELAYED_RELEASE_CAPSULE | Freq: Every day | ORAL | 2 refills | Status: AC

## 2024-05-29 ENCOUNTER — Other Ambulatory Visit: Payer: Self-pay

## 2024-05-29 ENCOUNTER — Ambulatory Visit: Attending: Internal Medicine | Admitting: Physical Therapy

## 2024-05-29 DIAGNOSIS — M62838 Other muscle spasm: Secondary | ICD-10-CM | POA: Diagnosis present

## 2024-05-29 DIAGNOSIS — R279 Unspecified lack of coordination: Secondary | ICD-10-CM | POA: Diagnosis present

## 2024-05-29 DIAGNOSIS — M6281 Muscle weakness (generalized): Secondary | ICD-10-CM | POA: Diagnosis present

## 2024-05-29 DIAGNOSIS — R3915 Urgency of urination: Secondary | ICD-10-CM | POA: Insufficient documentation

## 2024-05-29 DIAGNOSIS — N3946 Mixed incontinence: Secondary | ICD-10-CM | POA: Insufficient documentation

## 2024-05-29 DIAGNOSIS — R293 Abnormal posture: Secondary | ICD-10-CM | POA: Insufficient documentation

## 2024-05-29 NOTE — Therapy (Unsigned)
 OUTPATIENT PHYSICAL THERAPY FEMALE PELVIC EVALUATION   Patient Name: Gabrielle Perkins MRN: [id number] DOB:1976/08/24, 48 y.o., female Today's Date: 05/29/2024  END OF SESSION:   Past Medical History:  Diagnosis Date   Diabetes mellitus without complication (HCC)    GERD (gastroesophageal reflux disease)    Hypertension    Past Surgical History:  Procedure Laterality Date   btl     LEFT HEART CATH AND CORONARY ANGIOGRAPHY N/A 09/12/2017   Procedure: LEFT HEART CATH AND CORONARY ANGIOGRAPHY;  Surgeon: Claudene Pacific, MD;  Location: [facility];  Service: Cardiovascular;  Laterality: N/A;   LIPOMA EXCISION N/A 10/04/2019   Procedure: EXCISION OF FOREHEAD LIPOMA;  Surgeon: Elisabeth Craig RAMAN, MD;  Location: [facility];  Service: Plastics;  Laterality: N/A;  45 min, please   TUBAL LIGATION     Patient Active Problem List   Diagnosis Date Noted   Urinary urgency 03/26/2024   Elevated lipoprotein(a) 03/26/2024   Community acquired pneumonia of left upper lobe of lung 02/23/2024   Encounter for general adult medical examination w/o abnormal findings 11/16/2023   Fatigue 11/16/2023   URI with cough and congestion 10/02/2023   Class 3 severe obesity with serious comorbidity and body mass index (BMI) of 40.0 to 44.9 in adult 03/28/2023   Witnessed apneic spells 03/17/2022   Sleeps in sitting position due to orthopnea 03/17/2022   Pure hypercholesterolemia 03/18/2021   Iron  deficiency anemia due to chronic blood loss 03/18/2021   Obesity, diabetes, and hypertension syndrome (HCC) 09/10/2019   Uncontrolled type 2 diabetes mellitus with hyperglycemia (HCC) 09/10/2019   Essential hypertension, benign 09/08/2017   Dyslipidemia associated with type 2 diabetes mellitus (HCC) 09/08/2017    PCP: ***  REFERRING PROVIDER: ***  REFERRING DIAG: ***  THERAPY DIAG:  Muscle weakness (generalized)  Abnormal posture  Other muscle spasm  Unspecified lack of  coordination  Mixed incontinence  Rationale for Evaluation and Treatment: Rehabilitation  ONSET DATE: 1 year  SUBJECTIVE:                                                                                                                                                                                           SUBJECTIVE STATEMENT: Urinary incontinence with urgency, coughing, sneezing, laughing.   Fluid intake: 40oz 4-5x daily water.   PAIN:  Are you having pain? No   PRECAUTIONS: None  RED FLAGS: None   WEIGHT BEARING RESTRICTIONS: No  FALLS:  Has patient fallen in last 6 months? 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   ACTIVITY LEVEL : low   PLOF: Independent  PATIENT GOALS: to  have no urinary incontinence   PERTINENT HISTORY:  *** Sexual abuse: No  BOWEL MOVEMENT: Pain with bowel movement: No Type of bowel movement:Type (Bristol Stool Scale) 4, Frequency daily, and Strain no Fully empty rectum: No Leakage: No Pads: No Fiber supplement/laxative No  URINATION: Pain with urination: No Fully empty bladder: Yes:   Stream: Strong Urgency: Yes  Frequency: 45 mins to hour; 2-3x nightly  Leakage: Urge to void, Walking to the bathroom, Coughing, and Sneezing Pads: No  INTERCOURSE:  Ability to have vaginal penetration Yes  Pain with intercourse: none DrynessYes  Climax: not painful  Marinoff Scale: 0/3 Does use lubricant   PREGNANCY: Vaginal deliveries 4 Tearing Yes: with all fourth  Episiotomy No C-section deliveries 0 Currently pregnant No  PROLAPSE: None   OBJECTIVE:  Note: Objective measures were completed at Evaluation unless otherwise noted.  DIAGNOSTIC FINDINGS:    PATIENT SURVEYS:    PFIQ-7: ***  COGNITION: Overall cognitive status: {cognition:24006}     SENSATION: Light touch: {intact/deficits:24005}  LUMBAR SPECIAL TESTS:  {lumbar special test:25242}  FUNCTIONAL TESTS:  {Functional  tests:24029}  GAIT: Assistive device utilized: {Assistive devices:23999} Comments: ***  POSTURE: {posture:25561}   LUMBARAROM/PROM:  A/PROM A/PROM  eval  Flexion   Extension   Right lateral flexion   Left lateral flexion   Right rotation   Left rotation    (Blank rows = not tested)  LOWER EXTREMITY ROM:  {AROM/PROM:27142} ROM Right eval Left eval  Hip flexion    Hip extension    Hip abduction    Hip adduction    Hip internal rotation    Hip external rotation    Knee flexion    Knee extension    Ankle dorsiflexion    Ankle plantarflexion    Ankle inversion    Ankle eversion     (Blank rows = not tested)  LOWER EXTREMITY MMT:  MMT Right eval Left eval  Hip flexion    Hip extension    Hip abduction    Hip adduction    Hip internal rotation    Hip external rotation    Knee flexion    Knee extension    Ankle dorsiflexion    Ankle plantarflexion    Ankle inversion    Ankle eversion     (Blank rows = not tested) PALPATION:   General: ***  Pelvic Alignment: ***  Abdominal: ***                External Perineal Exam: ***                             Internal Pelvic Floor: ***  Patient confirms identification and approves PT to assess internal pelvic floor and treatment {yes/no:20286}  PELVIC MMT:   MMT eval  Vaginal   Internal Anal Sphincter   External Anal Sphincter   Puborectalis   Diastasis Recti   (Blank rows = not tested)        TONE: ***  PROLAPSE: ***  TODAY'S TREATMENT:  DATE: ***  EVAL ***   PATIENT EDUCATION:  Education details: *** Person educated: {Person educated:25204} Education method: {Education Method:25205} Education comprehension: {Education Comprehension:25206}  HOME EXERCISE PROGRAM: ***  ASSESSMENT:  CLINICAL IMPRESSION: Patient is a *** y.o. *** who was seen today for physical  therapy evaluation and treatment for ***.   OBJECTIVE IMPAIRMENTS: {opptimpairments:25111}.   ACTIVITY LIMITATIONS: {activitylimitations:27494}  PARTICIPATION LIMITATIONS: {participationrestrictions:25113}  PERSONAL FACTORS: {Personal factors:25162} are also affecting patient's functional outcome.   REHAB POTENTIAL: {rehabpotential:25112}  CLINICAL DECISION MAKING: {clinical decision making:25114}  EVALUATION COMPLEXITY: {Evaluation complexity:25115}   GOALS: Goals reviewed with patient? {yes/no:20286}  SHORT TERM GOALS: Target date: ***  *** Baseline: Goal status: INITIAL  2.  *** Baseline:  Goal status: INITIAL  3.  *** Baseline:  Goal status: INITIAL  4.  *** Baseline:  Goal status: INITIAL  5.  *** Baseline:  Goal status: INITIAL  6.  *** Baseline:  Goal status: INITIAL  LONG TERM GOALS: Target date: ***  *** Baseline:  Goal status: INITIAL  2.  *** Baseline:  Goal status: INITIAL  3.  *** Baseline:  Goal status: INITIAL  4.  *** Baseline:  Goal status: INITIAL  5.  *** Baseline:  Goal status: INITIAL  6.  *** Baseline:  Goal status: INITIAL  PLAN:  PT FREQUENCY: {rehab frequency:25116}  PT DURATION: {rehab duration:25117}  PLANNED INTERVENTIONS: {rehab planned interventions:25118::97110-Therapeutic exercises,97530- Therapeutic [phone]- Neuromuscular re-education,97535- Self Rjmz,02859- Manual therapy}  PLAN FOR NEXT SESSION: ***   Darryle Navy, PT, DPT 09/09/253:53 PM  [facility] [address], [address] [place], [place] [address] Phone # [phone] Fax [phone]

## 2024-06-02 ENCOUNTER — Ambulatory Visit (HOSPITAL_COMMUNITY)
Admission: EM | Admit: 2024-06-02 | Discharge: 2024-06-02 | Disposition: A | Discharge status: Home / Self Care | Attending: Emergency Medicine | Admitting: Emergency Medicine

## 2024-06-02 ENCOUNTER — Encounter (HOSPITAL_COMMUNITY): Payer: Self-pay | Admitting: Emergency Medicine

## 2024-06-02 ENCOUNTER — Ambulatory Visit (INDEPENDENT_AMBULATORY_CARE_PROVIDER_SITE_OTHER)

## 2024-06-02 DIAGNOSIS — M25561 Pain in right knee: Secondary | ICD-10-CM

## 2024-06-02 DIAGNOSIS — S82831A Other fracture of upper and lower end of right fibula, initial encounter for closed fracture: Secondary | ICD-10-CM

## 2024-06-02 MED ORDER — HYDROCODONE-ACETAMINOPHEN 5-325 MG PO TABS: 2.0000 | ORAL_TABLET | Freq: Three times a day (TID) | ORAL | 0 refills | Status: DC | PRN

## 2024-06-02 NOTE — Discharge Instructions (Addendum)
 You have fractured your fibula.  We applied a splint in clinic today. You can wear the Ace wrap to your knee as needed for any pain or swelling. Use the crutches to help you walk until you see orthopedic doctor. You can alternate between Tylenol  ibuprofen as needed for pain. I have prescribed a few pills of Norco that you can take every 8 hours as needed for pain.  Take 1 tablet every 8 hours as needed. Follow-up with [facility] health sports Medicine for further evaluation and management of of your fracture.
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
## 2024-06-02 NOTE — Progress Notes (Signed)
 Orthopedic Tech Progress Note Patient Details:  Gabrielle Perkins 10-31-1975 [id number] Applied short leg and stirrup splint per order. Also, gave pt instructions on how to use crutches.  Ortho Devices Type of Ortho Device: Ace wrap, Cotton web roll, Short leg splint, Stirrup splint Ortho Device/Splint Location: RLE Ortho Device/Splint Interventions: Ordered, Application, Adjustment   Post Interventions Patient Tolerated: Well Instructions Provided: Adjustment of device, Care of device, Poper ambulation with device  [name] 06/02/2024, 7:15 PM
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
## 2024-06-04 ENCOUNTER — Ambulatory Visit (INDEPENDENT_AMBULATORY_CARE_PROVIDER_SITE_OTHER): Admitting: Physician Assistant

## 2024-06-04 ENCOUNTER — Ambulatory Visit: Admitting: Family Medicine

## 2024-06-04 ENCOUNTER — Encounter: Payer: Self-pay | Admitting: Physician Assistant

## 2024-06-04 DIAGNOSIS — S8265XA Nondisplaced fracture of lateral malleolus of left fibula, initial encounter for closed fracture: Secondary | ICD-10-CM
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
## 2024-06-12 ENCOUNTER — Other Ambulatory Visit: Payer: Self-pay | Admitting: Internal Medicine

## 2024-06-12 DIAGNOSIS — Z1231 Encounter for screening mammogram for malignant neoplasm of breast: Secondary | ICD-10-CM

## 2024-06-25 ENCOUNTER — Other Ambulatory Visit: Payer: Self-pay

## 2024-06-25 ENCOUNTER — Encounter: Payer: Self-pay | Admitting: Physician Assistant

## 2024-06-25 ENCOUNTER — Ambulatory Visit: Admitting: Physician Assistant

## 2024-06-25 DIAGNOSIS — S8265XA Nondisplaced fracture of lateral malleolus of left fibula, initial encounter for closed fracture: Secondary | ICD-10-CM

## 2024-06-25 DIAGNOSIS — M25561 Pain in right knee: Secondary | ICD-10-CM
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
## 2024-07-16 ENCOUNTER — Ambulatory Visit: Admitting: Physical Therapy

## 2024-07-16 ENCOUNTER — Other Ambulatory Visit: Payer: Self-pay

## 2024-07-16 ENCOUNTER — Ambulatory Visit: Admitting: Physician Assistant

## 2024-07-16 ENCOUNTER — Encounter: Payer: Self-pay | Admitting: Physician Assistant

## 2024-07-16 DIAGNOSIS — S8265XA Nondisplaced fracture of lateral malleolus of left fibula, initial encounter for closed fracture: Secondary | ICD-10-CM
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
## 2024-07-23 ENCOUNTER — Ambulatory Visit
Admission: RE | Admit: 2024-07-23 | Discharge: 2024-07-23 | Disposition: A | Source: Ambulatory Visit | Attending: Internal Medicine | Admitting: Internal Medicine

## 2024-07-23 ENCOUNTER — Ambulatory Visit: Admitting: Internal Medicine

## 2024-07-23 ENCOUNTER — Encounter: Payer: Self-pay | Admitting: Internal Medicine

## 2024-07-23 ENCOUNTER — Encounter: Payer: Self-pay | Admitting: Radiology

## 2024-07-23 VITALS — BP 120/80 | HR 84 | Temp 99.2°F | Ht 63.0 in | Wt 231.8 lb

## 2024-07-23 DIAGNOSIS — E785 Hyperlipidemia, unspecified: Secondary | ICD-10-CM | POA: Diagnosis not present

## 2024-07-23 DIAGNOSIS — E282 Polycystic ovarian syndrome: Secondary | ICD-10-CM | POA: Diagnosis not present

## 2024-07-23 DIAGNOSIS — I10 Essential (primary) hypertension: Secondary | ICD-10-CM

## 2024-07-23 DIAGNOSIS — Z2821 Immunization not carried out because of patient refusal: Secondary | ICD-10-CM | POA: Diagnosis not present

## 2024-07-23 DIAGNOSIS — S82891S Other fracture of right lower leg, sequela: Secondary | ICD-10-CM | POA: Diagnosis not present

## 2024-07-23 DIAGNOSIS — E1169 Type 2 diabetes mellitus with other specified complication: Secondary | ICD-10-CM

## 2024-07-23 DIAGNOSIS — E7841 Elevated Lipoprotein(a): Secondary | ICD-10-CM

## 2024-07-23 DIAGNOSIS — Z1231 Encounter for screening mammogram for malignant neoplasm of breast: Secondary | ICD-10-CM

## 2024-07-23 MED ORDER — SYNJARDY XR 25-1000 MG PO TB24: 1.0000 | ORAL_TABLET | Freq: Every day | ORAL | Status: DC

## 2024-07-23 MED ORDER — MOUNJARO 5 MG/0.5ML ~~LOC~~ SOAJ: 5.0000 mg | SUBCUTANEOUS | 0 refills | Status: AC

## 2024-07-23 NOTE — Progress Notes (Unsigned)
 I,Gabrielle Perkins, CMA,acting as a neurosurgeon for Gabrielle LOISE Slocumb, MD.,have documented all relevant documentation on the behalf of Gabrielle LOISE Slocumb, MD,as directed by  Gabrielle LOISE Slocumb, MD while in the presence of Gabrielle LOISE Slocumb, MD.  Subjective:  Patient ID: Gabrielle Perkins , female    DOB: Jan 29, 1976 , 48 y.o.   MRN: [id number]  Chief Complaint  Patient presents with  . Hypertension    Patient presents today for a bp and dm follow up, Patient reports compliance with medication. Patient denies any chest pain, SOB, or headaches. Patient reports she stopped her ozempic  about 2 months ago do to vomiting.     HPI Discussed the use of AI scribe software for clinical note transcription with the patient, who gave verbal consent to proceed.  History of Present Illness    Diabetes She presents for her follow-up diabetic visit. She has type 2 diabetes mellitus. There are no hypoglycemic associated symptoms. There are no diabetic associated symptoms. Pertinent negatives for diabetes include no polydipsia, no polyphagia and no polyuria. There are no hypoglycemic complications. Risk factors for coronary artery disease include diabetes mellitus, dyslipidemia, hypertension, obesity and sedentary lifestyle. Current diabetic treatment includes oral agent (monotherapy). She is compliant with treatment some of the time. She is following a generally healthy diet. She participates in exercise intermittently. Her breakfast blood glucose is taken between 8-9 am. Her breakfast blood glucose range is generally 90-110 mg/dl. (97/-120 blood sugar) Eye exam is current.  Hypertension This is a chronic problem. The current episode started more than 1 year ago. The problem has been gradually improving since onset. The problem is controlled. Past treatments include angiotensin blockers and diuretics. The current treatment provides moderate improvement. Compliance problems include exercise.      Past Medical History:   Diagnosis Date  . Diabetes mellitus without complication (HCC)   . GERD (gastroesophageal reflux disease)   . Hypertension      Family History  Problem Relation Age of Onset  . Hypertension Mother   . Diabetes Mother   . Hypertension Father   . Diabetes Father   . Sarcoidosis Father   . Breast cancer Maternal Grandmother   . Hypertension Maternal Grandmother   . Coronary artery disease Maternal Grandmother   . Hypertension Maternal Grandfather      Current Outpatient Medications:  .  acetaminophen  (TYLENOL ) 500 MG tablet, Take 500 mg by mouth every 6 (six) hours as needed., Disp: , Rfl:  .  amLODipine  (NORVASC ) 5 MG tablet, Take 1 tablet (5 mg total) by mouth daily., Disp: 90 tablet, Rfl: 2 .  benzonatate  (TESSALON  PERLES) 100 MG capsule, Take 1 capsule (100 mg total) by mouth 3 (three) times daily as needed for cough., Disp: 30 capsule, Rfl: 1 .  Blood Glucose Monitoring Suppl (FREESTYLE FREEDOM LITE) w/Device KIT, Use as directed to check blood sugars 2 times per day dx: e11.22, Disp: 1 kit, Rfl: 1 .  cetirizine  (ZYRTEC ) 10 MG tablet, Take 1 tablet (10 mg total) by mouth daily., Disp: 90 tablet, Rfl: 2 .  empagliflozin  (JARDIANCE ) 25 MG TABS tablet, Take 1 tablet (25 mg total) by mouth daily before breakfast., Disp: 90 tablet, Rfl: 2 .  fluticasone  (FLONASE ) 50 MCG/ACT nasal spray, Place 2 sprays into both nostrils daily., Disp: 16 g, Rfl: 2 .  glucose blood (FREESTYLE LITE) test strip, Use as instructed to check blood sugars 2 times per day dx: e11.22, Disp: 150 each, Rfl: 2 .  levonorgestrel (MIRENA, 52  MG,) 20 MCG/DAY IUD, Mirena 20 mcg/24 hours (7 yrs) 52 mg intrauterine device  Take 1 device by intrauterine route., Disp: , Rfl:  .  metFORMIN  (GLUCOPHAGE -XR) 500 MG 24 hr tablet, Take 2 tablets (1,000 mg total) by mouth daily with supper., Disp: 180 tablet, Rfl: 1 .  metoprolol  succinate (TOPROL  XL) 50 MG 24 hr tablet, Take 1 tablet (50 mg total) by mouth daily., Disp: 90  tablet, Rfl: 2 .  Microlet Lancets MISC, Use as directed to check blood sugars 2 times per day dx: e11.22, Disp: 150 each, Rfl: 2 .  omeprazole  (PRILOSEC) 40 MG capsule, Take 1 capsule (40 mg total) by mouth daily., Disp: 90 capsule, Rfl: 2 .  promethazine -dextromethorphan (PROMETHAZINE -DM) 6.25-15 MG/5ML syrup, Take 5 mLs by mouth at bedtime as needed for cough., Disp: 118 mL, Rfl: 0 .  telmisartan -hydrochlorothiazide  (MICARDIS  HCT) 80-25 MG tablet, Take 1 tablet by mouth daily., Disp: 90 tablet, Rfl: 2 .  Semaglutide , 2 MG/DOSE, (OZEMPIC , 2 MG/DOSE,) 8 MG/3ML SOPN, INJECT 2MG  ONCE WEEKLY. (Patient not taking: Reported on 07/23/2024), Disp: 3 mL, Rfl: 3  Current Facility-Administered Medications:  .  triamcinolone  acetonide (KENALOG ) 10 MG/ML injection 10 mg, 10 mg, Other, Once, [place], [place] RAMAN, DPM   No Known Allergies   Review of Systems  Constitutional: Negative.   Respiratory: Negative.    Cardiovascular: Negative.   Endocrine: Negative for polydipsia, polyphagia and polyuria.  Neurological: Negative.   Psychiatric/Behavioral: Negative.       Today's Vitals   07/23/24 1553  BP: 120/80  Pulse: 84  Temp: 99.2 F (37.3 C)  TempSrc: Oral  Weight: 231 lb 12.8 oz (105.1 kg)  Height: 5' 3 (1.6 m)  PainSc: 0-No pain   Body mass index is 41.06 kg/m.  Wt Readings from Last 3 Encounters:  07/23/24 231 lb 12.8 oz (105.1 kg)  03/22/24 229 lb 6.4 oz (104.1 kg)  02/23/24 225 lb 3.2 oz (102.2 kg)     Objective:  Physical Exam Vitals and nursing note reviewed.  Constitutional:      Appearance: Normal appearance. She is obese.  HENT:     Head: Normocephalic and atraumatic.  Eyes:     Extraocular Movements: Extraocular movements intact.  Cardiovascular:     Rate and Rhythm: Normal rate and regular rhythm.     Heart sounds: Normal heart sounds.  Pulmonary:     Effort: Pulmonary effort is normal.     Breath sounds: Normal breath sounds.  Musculoskeletal:     Cervical back:  Normal range of motion.  Skin:    General: Skin is warm.  Neurological:     General: No focal deficit present.     Mental Status: She is alert.  Psychiatric:        Mood and Affect: Mood normal.        Behavior: Behavior normal.         Assessment And Plan:  Dyslipidemia associated with type 2 diabetes mellitus (HCC)  Essential hypertension, benign  Elevated lipoprotein(a)  Influenza vaccination declined  Encounter for Papanicolaou smear of cervix  Assessment and Plan Assessment & Plan      Return for keep same next.  Patient was given opportunity to ask questions. Patient verbalized understanding of the plan and was able to repeat key elements of the plan. All questions were answered to their satisfaction.  Gabrielle LOISE Slocumb, MD  I, Gabrielle LOISE Slocumb, MD, have reviewed all documentation for this visit. The documentation on 07/23/24 for the exam,  diagnosis, procedures, and orders are all accurate and complete.   IF YOU HAVE BEEN REFERRED TO A SPECIALIST, IT MAY TAKE 1-2 WEEKS TO SCHEDULE/PROCESS THE REFERRAL. IF YOU HAVE NOT HEARD FROM US /SPECIALIST IN TWO WEEKS, PLEASE GIVE US  A CALL AT [phone] X [phone].   THE PATIENT IS ENCOURAGED TO PRACTICE SOCIAL DISTANCING DUE TO THE COVID-19 PANDEMIC.

## 2024-07-23 NOTE — Patient Instructions (Signed)

## 2024-07-24 ENCOUNTER — Encounter: Admitting: Physical Therapy

## 2024-07-24 DIAGNOSIS — S82891A Other fracture of right lower leg, initial encounter for closed fracture: Secondary | ICD-10-CM | POA: Insufficient documentation

## 2024-07-24 DIAGNOSIS — E282 Polycystic ovarian syndrome: Secondary | ICD-10-CM | POA: Insufficient documentation

## 2024-07-24 LAB — CMP14+EGFR
ALT: 20 IU/L (ref 0–32)
AST: 17 IU/L (ref 0–40)
Albumin: 4.2 g/dL (ref 3.9–4.9)
Alkaline Phosphatase: 131 IU/L — ABNORMAL HIGH (ref 41–116)
BUN/Creatinine Ratio: 8 — ABNORMAL LOW (ref 9–23)
BUN: 7 mg/dL (ref 6–24)
Bilirubin Total: 0.2 mg/dL (ref 0.0–1.2)
CO2: 21 mmol/L (ref 20–29)
Calcium: 9.6 mg/dL (ref 8.7–10.2)
Chloride: 99 mmol/L (ref 96–106)
Creatinine, Ser: 0.89 mg/dL (ref 0.57–1.00)
Globulin, Total: 3 g/dL (ref 1.5–4.5)
Glucose: 199 mg/dL — ABNORMAL HIGH (ref 70–99)
Potassium: 4 mmol/L (ref 3.5–5.2)
Sodium: 137 mmol/L (ref 134–144)
Total Protein: 7.2 g/dL (ref 6.0–8.5)
eGFR: 80 mL/min/1.73 (ref >59)

## 2024-07-24 LAB — HEMOGLOBIN A1C
Est. average glucose Bld gHb Est-mCnc: 194 mg/dL
Hgb A1c MFr Bld: 8.4 % — ABNORMAL HIGH (ref 4.8–5.6)

## 2024-07-24 NOTE — Assessment & Plan Note (Signed)
 Previously managed by Dr. Darcel. Requires new gynecologist due to insurance changes. - Refer to gynecologist at [facility] for PCOS management.

## 2024-07-24 NOTE — Assessment & Plan Note (Signed)
 Suboptimal control with A1c of 8. Non-adherence to Ozempic  and metformin  due to side effects. Recent ankle fracture affected adherence. Considered switch to Mounjaro  and combination with Jardiance . - Provide Mounjaro  samples, initiate at lowest dose. - Provide Synjardy XR 25/1000 mg samples for combination therapy. - Refer to pharmacist for diabetes management. - Schedule lab visit in four weeks for kidney function check. - Educated on importance of communication regarding medication side effects.

## 2024-07-24 NOTE — Assessment & Plan Note (Signed)
 LDL goal is less than 70, she is at goal without medication.  However, due to elevated Lp(a), she would likely benefit from statin therapy. Consider low dose statin three days weekly.  - encouraged to follow heart healthy lifestyle.

## 2024-07-24 NOTE — Assessment & Plan Note (Signed)
 Chronic, blood pressure well-controlled. She is encouraged to follow a low sodium diet.  - Continue amlodipine  5 mg daily. - Continue telmisartan  80/25 mg daily. - Continue metoprolol  50 mg XL daily. - Advise taking telmisartan  and amlodipine  in the morning, metoprolol  XL at night.
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
## 2024-07-27 ENCOUNTER — Encounter: Payer: Self-pay | Admitting: Pharmacist

## 2024-07-27 ENCOUNTER — Telehealth: Payer: Self-pay | Admitting: Pharmacist

## 2024-07-27 DIAGNOSIS — E1165 Type 2 diabetes mellitus with hyperglycemia: Secondary | ICD-10-CM

## 2024-07-27 NOTE — Progress Notes (Signed)
   07/27/2024  Patient ID: Gabrielle Perkins, female   DOB: 1976/09/05, 48 y.o.   MRN: [id number]  Patient was called regarding diabetes management and specificially switching from Ozempic  to Mounjaro .  Unfortunately, she did not answer the phone. HIPAA compliant message was left on her voicemail.  Lab Results  Component Value Date   HGBA1C 8.4 (H) 07/23/2024   HGBA1C 8.0 (H) 03/22/2024   HGBA1C 6.7 (H) 11/16/2023   Lipid Panel     Component Value Date/Time   CHOL 114 11/16/2023 1125   TRIG 78 11/16/2023 1125   HDL 35 (L) 11/16/2023 1125   CHOLHDL 3.3 11/16/2023 1125   CHOLHDL 3.2 09/09/2017 0539   VLDL 21 09/09/2017 0539   LDLCALC 63 11/16/2023 1125   LABVLDL 16 11/16/2023 1125   Of note: Telmisartan -HCTZ 80/25 has not been filled since--01/24/24  Plan: Will Call patient back in 1 week.   Gabrielle Perkins, PharmD, BCACP Clinical Pharmacist [phone]

## 2024-07-27 NOTE — Progress Notes (Signed)
   07/27/2024  Patient ID: Gabrielle Perkins, female   DOB: 08-26-1976, 47 y.o.   MRN: [id number] error

## 2024-07-30 ENCOUNTER — Ambulatory Visit: Payer: Self-pay | Admitting: Internal Medicine

## 2024-07-31 ENCOUNTER — Encounter: Admitting: Physical Therapy

## 2024-08-01 ENCOUNTER — Telehealth: Payer: Self-pay

## 2024-08-02 ENCOUNTER — Telehealth: Payer: Self-pay | Admitting: Pharmacist

## 2024-08-02 DIAGNOSIS — E1169 Type 2 diabetes mellitus with other specified complication: Secondary | ICD-10-CM

## 2024-08-02 NOTE — Progress Notes (Signed)
 08/02/2024 Name: Gabrielle Perkins MRN: [id number] DOB: 11/23/1975  Chief Complaint  Patient presents with   Medication Management    Diabetes    Gabrielle Perkins is a 48 y.o. year old female who presented for a telephone visit.   They were referred to the pharmacist by their PCP for assistance in managing diabetes.    Subjective: Gabrielle Perkins is a 48 year old female with multiple medical conditions including but not limited to:  type 2 diabetes, hyperlipidemia, obesity, polycystic ovarian disorder, hypertension and fatigue. There were issues with her Mounajro prescription and her A1c has been elevated.  Care Team: Primary Care Provider: Jarold Medici, MD ; Next Scheduled Visit: 09/28/2023  Medication Access/Adherence  Current Pharmacy:  [id number] [place], [place] - [address] [place] [address] Phone: [phone] Fax: [phone]  [facility] - [place], [place] - [address] [place] [place] [address] Phone: [phone] Fax: [phone]   Patient reports affordability concerns with their medications: Yes  Patient reports access/transportation concerns to their pharmacy: No  Patient reports adherence concerns with their medications:  No   ---Prior authorization needed for Mounjaro .   Diabetes:  Current medications:   Moujaro 5 mg weekly   Patient denies hypoglycemic s/sx including  dizziness, shakiness, sweating. Patient denies hyperglycemic symptoms including  polyuria, polydipsia, polyphagia, nocturia, neuropathy, blurred vision.   Macrovascular and Microvascular Risk Reduction:  Statin? no; ACEi/ARB? yes (telmisartan -hydrochlorothiazide ) Last urinary albumin/creatinine ratio:   Lab Results  Component Value Date   MICRALBCREAT 6 11/16/2023   MICRALBCREAT 766 (H) 11/03/2022   MICRALBCREAT 16 10/15/2021   MICRALBCREAT <30 10/13/2020    MICRALBCREAT 5 05/10/2019   Last eye exam:  Lab Results  Component Value Date   HMDIABEYEEXA No Retinopathy 01/04/2023   Last foot exam: No foot exam found Tobacco Use:  Tobacco Use: Low Risk  (07/23/2024)   Patient History    Smoking Tobacco Use: Never    Smokeless Tobacco Use: Never    Passive Exposure: Not on file     Objective:  Lab Results  Component Value Date   HGBA1C 8.4 (H) 07/23/2024    Lab Results  Component Value Date   CREATININE 0.89 07/23/2024   BUN 7 07/23/2024   NA 137 07/23/2024   K 4.0 07/23/2024   CL 99 07/23/2024   CO2 21 07/23/2024    Lab Results  Component Value Date   CHOL 114 11/16/2023   HDL 35 (L) 11/16/2023   LDLCALC 63 11/16/2023   TRIG 78 11/16/2023   CHOLHDL 3.3 11/16/2023    Medications Reviewed Today     Reviewed by Jolee Cassius PARAS, RPH (Pharmacist) on 08/02/24 at 1650  Med List Status: <None>   Medication Order Taking? Sig Documenting Provider Last Dose Status Informant  acetaminophen  (TYLENOL ) 500 MG tablet [id number] Yes Take 500 mg by mouth every 6 (six) hours as needed. [provider]  Active   amLODipine  (NORVASC ) 5 MG tablet [id number] Yes Take 1 tablet (5 mg total) by mouth daily. Jarold Medici, MD  Active   benzonatate  (TESSALON  PERLES) 100 MG capsule [id number] Yes Take 1 capsule (100 mg total) by mouth 3 (three) times daily as needed for cough. Georgina Speaks, FNP  Active   Blood Glucose Monitoring Suppl (FREESTYLE FREEDOM LITE) w/Device KIT [id number] Yes Use as directed to check blood sugars 2 times per day dx:  e11.22 Jarold Medici, MD  Active   cetirizine  (ZYRTEC ) 10 MG tablet [id number] Yes Take 1 tablet (10 mg total) by mouth daily. Jarold Medici, MD  Active   Empagliflozin -metFORMIN  HCl ER (SYNJARDY XR) 25-1000 MG TB24 [id number] Yes Take 1 tablet by mouth daily. Jarold Medici, MD  Active   fluticasone  (FLONASE ) 50 MCG/ACT nasal spray [id number] Yes Place 2 sprays into both nostrils daily. Jarold Medici, MD   Active   glucose blood (FREESTYLE LITE) test strip [id number] Yes Use as instructed to check blood sugars 2 times per day dx: e11.22 Jarold Medici, MD  Active   levonorgestrel (MIRENA, 52 MG,) 20 MCG/DAY IUD [id number] Yes Mirena 20 mcg/24 hours (7 yrs) 52 mg intrauterine device  Take 1 device by intrauterine route. [provider]  Active   metoprolol  succinate (TOPROL  XL) 50 MG 24 hr tablet [id number] Yes Take 1 tablet (50 mg total) by mouth daily. Jarold Medici, MD  Active   Microlet Lancets MISC [id number] Yes Use as directed to check blood sugars 2 times per day dx: e11.22 Jarold Medici, MD  Active   omeprazole  [facility]) 40 MG capsule [id number] Yes Take 1 capsule (40 mg total) by mouth daily. Jarold Medici, MD  Active   promethazine -dextromethorphan (PROMETHAZINE -DM) 6.25-15 MG/5ML syrup [id number]  Take 5 mLs by mouth at bedtime as needed for cough.  Patient not taking: Reported on 08/02/2024   Enedelia Dorna HERO, FNP  Active   telmisartan -hydrochlorothiazide  (MICARDIS  HCT) 80-25 MG tablet [id number] Yes Take 1 tablet by mouth daily. Jarold Medici, MD  Active   tirzepatide  (MOUNJARO ) 5 MG/0.5ML Pen [id number] Yes Inject 5 mg into the skin once a week. Jarold Medici, MD  Active   triamcinolone  acetonide (KENALOG ) 10 MG/ML injection 10 mg [id number]   Magdalen Pasco RAMAN, DPM  Active   Med List Note Marisa Nathanel LOISE Bishop 09/10/17 1652): [facility] [phone]                07/23/2024    3:53 PM 06/02/2024    5:45 PM 03/22/2024    4:04 PM  Vitals with BMI  Height 5' 3  5' 3  Weight 231 lbs 13 oz  229 lbs 6 oz  BMI 41.07  40.65  Systolic 120 147 881  Diastolic 80 86 80  Pulse 84 133     Assessment/Plan:   Diabetes: - Currently uncontrolled; goal A1c <7%. Cardiorenal risk reduction is optimized.. Blood pressure is at goal <130/80. LDL is at goal.  -Called Patient's Pharmacy and assisted with the Mounjaro  claim. -Consulted with Dr. Jarold about the Patient  starting Freestyle Libre Continuous Glucose Monitoring Sensors and she agreed.  Follow Up Plan:   Send new prescription Freestyle Libre 3 plus sensors-co-signature required. Follow up with Patient in 1 week.   [name], PharmD, BCACP Clinical Pharmacist [phone]

## 2024-08-06 ENCOUNTER — Ambulatory Visit: Admitting: Physician Assistant

## 2024-08-06 ENCOUNTER — Encounter: Payer: Self-pay | Admitting: Physician Assistant

## 2024-08-06 DIAGNOSIS — S8265XA Nondisplaced fracture of lateral malleolus of left fibula, initial encounter for closed fracture: Secondary | ICD-10-CM

## 2024-08-06 DIAGNOSIS — M25561 Pain in right knee: Secondary | ICD-10-CM

## 2024-08-06 MED ORDER — FREESTYLE LIBRE 3 PLUS SENSOR MISC: 3 refills | Status: AC
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
## 2024-08-07 ENCOUNTER — Encounter: Admitting: Physical Therapy

## 2024-08-13 ENCOUNTER — Encounter: Admitting: Physical Therapy

## 2024-08-13 ENCOUNTER — Telehealth: Payer: Self-pay | Admitting: Pharmacist

## 2024-08-13 DIAGNOSIS — E1165 Type 2 diabetes mellitus with hyperglycemia: Secondary | ICD-10-CM

## 2024-08-13 NOTE — Progress Notes (Unsigned)
   08/13/2024  Patient ID: Gabrielle Perkins, female   DOB: 1976/07/30, 48 y.o.   MRN: [id number]  Called Patient to follow up on [employer]. HIPAA identifiers were obtained.  Patient said she did not get a chance to pick up the sensors.  [place].  Lab Results  Component Value Date   HGBA1C 8.4 (H) 07/23/2024   HGBA1C 8.0 (H) 03/22/2024   HGBA1C 6.7 (H) 11/16/2023     [place] [facility].  Often, we cannot see the fill history of medications filled on [employer].    I will follow up with the Patient next week to see if she was able to get her sensors, download the application and understand how to use the [facility] 3 plus sensors.  Patient said she knew how to use them because her husband uses [employer] sensors.   [name], PharmD, BCACP Clinical Pharmacist [phone]

## 2024-08-20 ENCOUNTER — Other Ambulatory Visit

## 2024-08-21 ENCOUNTER — Other Ambulatory Visit

## 2024-08-23 ENCOUNTER — Encounter: Admitting: Physical Therapy

## 2024-08-27 ENCOUNTER — Other Ambulatory Visit: Payer: Self-pay

## 2024-08-27 ENCOUNTER — Other Ambulatory Visit

## 2024-08-27 DIAGNOSIS — Z79899 Other long term (current) drug therapy: Secondary | ICD-10-CM

## 2024-08-27 LAB — BASIC METABOLIC PANEL WITH GFR
BUN/Creatinine Ratio: 13 (ref 9–23)
BUN: 8 mg/dL (ref 6–24)
CO2: 21 mmol/L (ref 20–29)
Calcium: 9 mg/dL (ref 8.7–10.2)
Chloride: 104 mmol/L (ref 96–106)
Creatinine, Ser: 0.64 mg/dL (ref 0.57–1.00)
Glucose: 135 mg/dL — ABNORMAL HIGH (ref 70–99)
Potassium: 3.8 mmol/L (ref 3.5–5.2)
Sodium: 139 mmol/L (ref 134–144)
eGFR: 110 mL/min/1.73 (ref >59)

## 2024-08-28 ENCOUNTER — Encounter: Admitting: Physical Therapy

## 2024-09-03 ENCOUNTER — Encounter: Admitting: Physical Therapy

## 2024-09-06 ENCOUNTER — Ambulatory Visit: Payer: Self-pay | Admitting: Internal Medicine

## 2024-09-21 ENCOUNTER — Telehealth: Payer: Self-pay | Admitting: Pharmacist

## 2024-09-21 DIAGNOSIS — E1165 Type 2 diabetes mellitus with hyperglycemia: Secondary | ICD-10-CM

## 2024-09-21 NOTE — Progress Notes (Signed)
" ° °  09/21/2024 Name: Gabrielle Perkins MRN: [id number] DOB: May 04, 1976  Chief Complaint  Patient presents with   Medication Assistance    Freestyle Libre Sensors    Patient was called to follow up on [employer]. HIPAA identifiers were obtained.  Patient said she went to pick up her Freestyle Sensors and was told a prior authorization would be necessary.  A prior authorization was attempted today via CoverMymeds. Freestyle Herlene was not covered and  [employer] requires Patient's to be on insulin  therapy.  This was explained to the Patient and inquires were made about testing supplies.  Patient could not remember the name of her test strips so I could send in a new prescription.    She has an upcoming appointment with Dr. Jarold on 09/28/23.  Her last HgA1c was : Lab Results  Component Value Date   HGBA1C 8.4 (H) 07/23/2024   HGBA1C 8.0 (H) 03/22/2024   HGBA1C 6.7 (H) 11/16/2023    Patient was started on Mounjaro  and is now on the 5 mg/week dose.  Dose may need to be increased to 10 mg weekly.  Will send note to Provider prior to the upcoming appointment. Will send MyChart message to the Patient to request the name of her testing supplies so refills can be sent.   [name], PharmD, BCACP Clinical Pharmacist [phone]  "

## 2024-09-27 ENCOUNTER — Encounter: Payer: Self-pay | Admitting: Internal Medicine

## 2024-09-27 ENCOUNTER — Ambulatory Visit: Payer: Self-pay | Admitting: Internal Medicine

## 2024-09-27 VITALS — BP 110/78 | HR 98 | Temp 98.2°F | Ht 63.0 in | Wt 232.0 lb

## 2024-09-27 DIAGNOSIS — I10 Essential (primary) hypertension: Secondary | ICD-10-CM

## 2024-09-27 DIAGNOSIS — G4733 Obstructive sleep apnea (adult) (pediatric): Secondary | ICD-10-CM | POA: Diagnosis not present

## 2024-09-27 DIAGNOSIS — E1165 Type 2 diabetes mellitus with hyperglycemia: Secondary | ICD-10-CM

## 2024-09-27 MED ORDER — SYNJARDY XR 25-1000 MG PO TB24: 1.0000 | ORAL_TABLET | Freq: Every day | ORAL | 5 refills | Status: AC

## 2024-09-27 NOTE — Progress Notes (Unsigned)
 I,[name], CMA,acting as a neurosurgeon for [name], MD.,have documented all relevant documentation on the behalf of [name], MD,as directed by  [name], MD while in the presence of [name], MD.  Subjective:  Patient ID: Gabrielle Perkins , female    DOB: 30-Dec-1975 , 49 y.o.   MRN: [id number]  Chief Complaint  Patient presents with   Diabetes    Patient presents today for Mounjaro  follow up. She reports compliance with sample given. She states finishing right before christmas.  Eye exam tomorrow. Pap not completed.    Hypertension    HPI Discussed the use of AI scribe software for clinical note transcription with the patient, who gave verbal consent to proceed.    She presents today for dm/htn check.  She was started on Mounjaro  at her last visit, she had no problems. Now needs rx.   Diabetes She presents for her follow-up diabetic visit. She has type 2 diabetes mellitus. There are no hypoglycemic associated symptoms. There are no diabetic associated symptoms. Pertinent negatives for diabetes include no polydipsia, no polyphagia and no polyuria. There are no hypoglycemic complications. Risk factors for coronary artery disease include diabetes mellitus, dyslipidemia, hypertension, obesity and sedentary lifestyle. Current diabetic treatment includes oral agent (monotherapy). She is compliant with treatment some of the time. She is following a generally healthy diet. She participates in exercise intermittently. Her breakfast blood glucose is taken between 8-9 am. Her breakfast blood glucose range is generally 90-110 mg/dl. (97/-120 blood sugar) Eye exam is current.  Hypertension This is a chronic problem. The current episode started more than 1 year ago. The problem has been gradually improving since onset. The problem is controlled. Past treatments include angiotensin blockers and diuretics. The current treatment provides moderate improvement. Compliance  problems include exercise.      Past Medical History:  Diagnosis Date   Diabetes mellitus without complication (HCC)    GERD (gastroesophageal reflux disease)    Hypertension      Family History  Problem Relation Age of Onset   Hypertension Mother    Diabetes Mother    Hypertension Father    Diabetes Father    Sarcoidosis Father    Breast cancer Maternal Grandmother    Hypertension Maternal Grandmother    Coronary artery disease Maternal Grandmother    Hypertension Maternal Grandfather      Current Outpatient Medications:    acetaminophen  (TYLENOL ) 500 MG tablet, Take 500 mg by mouth every 6 (six) hours as needed., Disp: , Rfl:    amLODipine  (NORVASC ) 5 MG tablet, Take 1 tablet (5 mg total) by mouth daily., Disp: 90 tablet, Rfl: 2   benzonatate  (TESSALON  PERLES) 100 MG capsule, Take 1 capsule (100 mg total) by mouth 3 (three) times daily as needed for cough., Disp: 30 capsule, Rfl: 1   Blood Glucose Monitoring Suppl (FREESTYLE FREEDOM LITE) w/Device KIT, Use as directed to check blood sugars 2 times per day dx: e11.22, Disp: 1 kit, Rfl: 1   cetirizine  (ZYRTEC ) 10 MG tablet, Take 1 tablet (10 mg total) by mouth daily., Disp: 90 tablet, Rfl: 2   Continuous Glucose Sensor (FREESTYLE LIBRE 3 PLUS SENSOR) MISC, Change sensor every 15 days., Disp: 6 each, Rfl: 3   fluticasone  (FLONASE ) 50 MCG/ACT nasal spray, Place 2 sprays into both nostrils daily., Disp: 16 g, Rfl: 2   glucose blood (FREESTYLE LITE) test strip, Use as instructed to check blood sugars 2 times per day dx: e11.22,  Disp: 150 each, Rfl: 2   levonorgestrel (MIRENA, 52 MG,) 20 MCG/DAY IUD, Mirena 20 mcg/24 hours (7 yrs) 52 mg intrauterine device  Take 1 device by intrauterine route., Disp: , Rfl:    metoprolol  succinate (TOPROL  XL) 50 MG 24 hr tablet, Take 1 tablet (50 mg total) by mouth daily., Disp: 90 tablet, Rfl: 2   Microlet Lancets MISC, Use as directed to check blood sugars 2 times per day dx: e11.22, Disp: 150 each,  Rfl: 2   omeprazole  (PRILOSEC) 40 MG capsule, Take 1 capsule (40 mg total) by mouth daily., Disp: 90 capsule, Rfl: 2   telmisartan -hydrochlorothiazide  (MICARDIS  HCT) 80-25 MG tablet, Take 1 tablet by mouth daily., Disp: 90 tablet, Rfl: 2   tirzepatide  (MOUNJARO ) 5 MG/0.5ML Pen, Inject 5 mg into the skin once a week., Disp: 2 mL, Rfl: 0   Empagliflozin -metFORMIN  HCl ER (SYNJARDY  XR) 25-1000 MG TB24, Take 1 tablet by mouth daily., Disp: 30 tablet, Rfl: 5   promethazine -dextromethorphan (PROMETHAZINE -DM) 6.25-15 MG/5ML syrup, Take 5 mLs by mouth at bedtime as needed for cough. (Patient not taking: Reported on 09/27/2024), Disp: 118 mL, Rfl: 0  Current Facility-Administered Medications:    triamcinolone  acetonide (KENALOG ) 10 MG/ML injection 10 mg, 10 mg, Other, Once, [place], [place] RAMAN, DPM   No Known Allergies   Review of Systems  Constitutional: Negative.   Respiratory: Negative.    Cardiovascular: Negative.   Endocrine: Negative for polydipsia, polyphagia and polyuria.  Neurological: Negative.   Psychiatric/Behavioral: Negative.       Today's Vitals   09/27/24 1440  BP: 110/78  Pulse: 98  Temp: 98.2 F (36.8 C)  SpO2: 98%  Weight: 232 lb (105.2 kg)  Height: 5' 3 (1.6 m)   Body mass index is 41.1 kg/m.  Wt Readings from Last 3 Encounters:  09/27/24 232 lb (105.2 kg)  07/23/24 231 lb 12.8 oz (105.1 kg)  03/22/24 229 lb 6.4 oz (104.1 kg)    The ASCVD Risk score (Arnett DK, et al., 2019) failed to calculate for the following reasons:   The valid total cholesterol range is 130 to 320 mg/dL  Objective:  Physical Exam Vitals and nursing note reviewed.  Constitutional:      Appearance: Normal appearance. She is obese.  HENT:     Head: Normocephalic and atraumatic.  Eyes:     Extraocular Movements: Extraocular movements intact.  Cardiovascular:     Rate and Rhythm: Normal rate and regular rhythm.     Heart sounds: Normal heart sounds.  Pulmonary:     Effort: Pulmonary effort  is normal.     Breath sounds: Normal breath sounds.  Musculoskeletal:     Cervical back: Normal range of motion.  Skin:    General: Skin is warm.  Neurological:     General: No focal deficit present.     Mental Status: She is alert.  Psychiatric:        Mood and Affect: Mood normal.        Behavior: Behavior normal.         Assessment And Plan:   Assessment & Plan Uncontrolled type 2 diabetes mellitus with hyperglycemia (HCC) Chronic, she was given samples of Mounjaro  2.5mg  weekly. She will use x 2 weeks, then increase to 5mg  weekly. I will send 5mg  to the pharmacy to start PA process.  - She will continue with metformin  and Jardiance  25mg  daily.  - Importance of dietary/medication/exercise compliance was discussed with the patient. She will f/u in four months for  re-evaluation.  Essential hypertension, benign Chronic, blood pressure well-controlled. She is encouraged to follow a low sodium diet.  - Continue amlodipine  5 mg daily. - Continue telmisartan  80/25 mg daily. - Continue metoprolol  50 mg XL daily. - Advise taking telmisartan  and amlodipine  in the morning, metoprolol  XL at night. OSA (obstructive sleep apnea) Chronic, encouraged use of CPAP.  She is encouraged to wear for at least four hours per night.  Morbid obesity (HCC) BMI 41.  She is encouraged to strive to lose ten percent of her body weight to decrease cardiac risk.     No orders of the defined types were placed in this encounter.    Return if symptoms worsen or fail to improve.  Patient was given opportunity to ask questions. Patient verbalized understanding of the plan and was able to repeat key elements of the plan. All questions were answered to their satisfaction.    I, [name], MD, have reviewed all documentation for this visit. The documentation on 09/27/2024 for the exam, diagnosis, procedures, and orders are all accurate and complete.   IF YOU HAVE BEEN REFERRED TO A SPECIALIST, IT MAY TAKE  1-2 WEEKS TO SCHEDULE/PROCESS THE REFERRAL. IF YOU HAVE NOT HEARD FROM US /SPECIALIST IN TWO WEEKS, PLEASE GIVE US  A CALL AT [phone].

## 2024-09-27 NOTE — Patient Instructions (Signed)

## 2024-09-28 LAB — OPHTHALMOLOGY REPORT-SCANNED

## 2024-09-30 NOTE — Assessment & Plan Note (Signed)
 Chronic, she was given samples of Mounjaro  2.5mg  weekly. She will use x 2 weeks, then increase to 5mg  weekly. I will send 5mg  to the pharmacy to start PA process.  - She will continue with metformin  and Jardiance  25mg  daily.  - Importance of dietary/medication/exercise compliance was discussed with the patient. She will f/u in four months for re-evaluation.

## 2024-09-30 NOTE — Assessment & Plan Note (Signed)
 Chronic, blood pressure well-controlled. She is encouraged to follow a low sodium diet.  - Continue amlodipine  5 mg daily. - Continue telmisartan  80/25 mg daily. - Continue metoprolol  50 mg XL daily. - Advise taking telmisartan  and amlodipine  in the morning, metoprolol  XL at night.

## 2024-09-30 NOTE — Assessment & Plan Note (Signed)
 Chronic, encouraged use of CPAP.  She is encouraged to wear for at least four hours per night.

## 2024-09-30 NOTE — Assessment & Plan Note (Signed)
 BMI 41.  She is encouraged to strive to lose ten percent of her body weight to decrease cardiac risk.

## 2024-10-01 ENCOUNTER — Telehealth: Payer: Self-pay | Admitting: Pharmacist

## 2024-10-01 DIAGNOSIS — E1165 Type 2 diabetes mellitus with hyperglycemia: Secondary | ICD-10-CM

## 2024-10-01 NOTE — Progress Notes (Signed)
" ° °  10/01/2024 Name: CYSTAL SHANNAHAN MRN: [id number] DOB: October 27, 1975  Chief Complaint  Patient presents with   Medication Management    Diabetes    Patient was called to follow up after her most recent PCP appointment on 09/28/23.  Unfortunately, she did not answer the phone. HIPAA compliant message was left on her voicemail.  Purpose of phone call was to inquire about Mounjaro  therapy. She is currently on Mounjaro  5 mg weekly and has been for over a month. A note was sent to her Provider about increasing the dose to Mounjaro  7.5 mg weekly but the increase was not made at her visit.  Most recent A1c:  (Mounjaro  started in response) Lab Results  Component Value Date   HGBA1C 8.4 (H) 07/23/2024   HGBA1C 8.0 (H) 03/22/2024   HGBA1C 6.7 (H) 11/16/2023   Also on  Synjardy  XR 25/1000   On ARB:  Telmisartan /HCTZ 80-25 lf 08/30/24 #90  [name], PharmD, BCACP Clinical Pharmacist [phone]   "

## 2024-10-04 ENCOUNTER — Other Ambulatory Visit (HOSPITAL_COMMUNITY): Payer: Self-pay

## 2024-10-04 ENCOUNTER — Telehealth: Payer: Self-pay | Admitting: Pharmacist

## 2024-10-04 ENCOUNTER — Telehealth: Payer: Self-pay

## 2024-10-04 DIAGNOSIS — E1165 Type 2 diabetes mellitus with hyperglycemia: Secondary | ICD-10-CM

## 2024-10-04 NOTE — Progress Notes (Signed)
" ° °  10/04/2024 Name: Gabrielle Perkins MRN: [id number] DOB: 1976-07-30  Chief Complaint  Patient presents with   Medication Assistance    Mounarjo   Patient was called regarding medication assistance with Moujaro.  The office staff have been working on prior authorizations for Mounjaro  for weeks.  I had Inocente Butcher, CPhT conduct a test claim using the Patient's insurance information for this year and the claim went through without PA for $85.00  [facility] was [facility] and the Pharmacist ran the claim with me on the phone.  Mounjaro  5 mg went through for $85 for a 28 day supply.  Patient was called back. She said she would go and pick it up this week.   [name], PharmD, BCACP Clinical Pharmacist [phone]  "

## 2024-10-04 NOTE — Telephone Encounter (Signed)
 Pharmacy Patient Advocate Encounter   Received notification from Physician's Office that prior authorization for Mounjaro  is required/requested.   Insurance verification completed.   The patient is insured through [employer].   Per test claim: The current 28 day co-pay is, $85.  No PA needed at this time. This test claim was processed through [facility]- copay amounts may vary at other pharmacies due to pharmacy/plan contracts, or as the patient moves through the different stages of their insurance plan.

## 2024-10-09 ENCOUNTER — Telehealth

## 2024-10-09 DIAGNOSIS — B3731 Acute candidiasis of vulva and vagina: Secondary | ICD-10-CM | POA: Diagnosis not present

## 2024-10-10 MED ORDER — FLUCONAZOLE 150 MG PO TABS: ORAL_TABLET | ORAL | 0 refills | Status: AC

## 2024-10-10 NOTE — Progress Notes (Signed)

## 2024-10-19 ENCOUNTER — Telehealth: Payer: Self-pay | Admitting: Pharmacist

## 2024-10-19 DIAGNOSIS — E1165 Type 2 diabetes mellitus with hyperglycemia: Secondary | ICD-10-CM

## 2024-10-19 NOTE — Progress Notes (Signed)
" ° °  10/19/2024 Name: Gabrielle Perkins MRN: [id number] DOB: 20-Mar-1976  Chief Complaint  Patient presents with   Medication Assistance    Mounjaro     Patient was called to follow up on Mounjaro .  HIPAA identifiers were obtained.  Patient said she did not pick up Mounarjo.  Her prior authorization was approved last week.  Per test claim, Mounjaro  should cost her $85.  It is unclear what exact strength of Mounjaro  the patient is currently using.  Her medication list has Mounjaro  5 mg but the Patient said she has been using Mounjaro  2.5 mg.  (This may be the case due to sampling).  [facility] in [facility] of 2025.  Plan: Send note to Provider to inquire about the correct strength of Mounjaro . Send in a new prescription. Follow up with Patient in 1 week.   [name], PharmD, BCACP Clinical Pharmacist [phone]  "

## 2024-10-24 ENCOUNTER — Telehealth: Payer: Self-pay | Admitting: Pharmacist

## 2024-10-24 DIAGNOSIS — E1165 Type 2 diabetes mellitus with hyperglycemia: Secondary | ICD-10-CM

## 2024-10-24 NOTE — Progress Notes (Signed)
" ° °  10/24/2024 Name: Gabrielle Perkins MRN: [id number] DOB: 03/01/1976  Chief Complaint  Patient presents with   Medication Adherence    Mounjaro    Patient was called to follow up on Mounjaro .  She has been getting Mounjaro  2.5 mg samples.  A new prescription was sent to her Pharmacy for Mounarjo 5 mg in November of last year and is most likely on hold.  A prior authorization was done for the Patient and Mounjaro  is now approved with a $85 copay.  Purpose of today's all was to see if the Patient picked up Mounjaro .  A new prescription can be sent if necessary.  It has not been filled per pharmacy fill data.  Lab Results  Component Value Date   HGBA1C 8.4 (H) 07/23/2024   HGBA1C 8.0 (H) 03/22/2024   HGBA1C 6.7 (H) 11/16/2023    On ARB: Telmisartan -Hydrochlorothiazide  80-25 1 tablet daily lf 08/30/24 #90 Not on statin therapy.   Gabrielle Perkins, PharmD, BCACP Clinical Pharmacist [phone]  "

## 2024-11-19 ENCOUNTER — Encounter: Admitting: Internal Medicine
